# Patient Record
Sex: Male | Born: 1965 | Race: Black or African American | Hispanic: No | Marital: Single | Smoking: Never smoker
Health system: Southern US, Community
[De-identification: ages and names within clinical notes are randomized; demographics above are authoritative.]

## PROBLEM LIST (undated history)

## (undated) DIAGNOSIS — N186 End stage renal disease: Secondary | ICD-10-CM

## (undated) DIAGNOSIS — N320 Bladder-neck obstruction: Secondary | ICD-10-CM

## (undated) DIAGNOSIS — D631 Anemia in chronic kidney disease: Secondary | ICD-10-CM

## (undated) DIAGNOSIS — Z978 Presence of other specified devices: Secondary | ICD-10-CM

## (undated) DIAGNOSIS — N2581 Secondary hyperparathyroidism of renal origin: Secondary | ICD-10-CM

## (undated) DIAGNOSIS — I10 Essential (primary) hypertension: Secondary | ICD-10-CM

---

## 2018-07-23 ENCOUNTER — Ambulatory Visit (HOSPITAL_COMMUNITY)
Admission: EM | Admit: 2018-07-23 | Discharge: 2018-07-23 | Disposition: A | Discharge status: Home / Self Care | Payer: BLUE CROSS/BLUE SHIELD | Attending: Family Medicine | Admitting: Family Medicine

## 2018-07-23 ENCOUNTER — Encounter (HOSPITAL_COMMUNITY): Payer: Self-pay | Admitting: Emergency Medicine

## 2018-07-23 DIAGNOSIS — N401 Enlarged prostate with lower urinary tract symptoms: Secondary | ICD-10-CM

## 2018-07-23 DIAGNOSIS — N138 Other obstructive and reflux uropathy: Secondary | ICD-10-CM | POA: Diagnosis not present

## 2018-07-23 LAB — POCT URINALYSIS DIP (DEVICE)
BILIRUBIN URINE: NEGATIVE
Glucose, UA: NEGATIVE mg/dL
Hgb urine dipstick: NEGATIVE
KETONES UR: NEGATIVE mg/dL
Leukocytes,Ua: NEGATIVE
Nitrite: NEGATIVE
Protein, ur: NEGATIVE mg/dL
Specific Gravity, Urine: <=1.005 (ref 1.005–1.030)
Urobilinogen, UA: 0.2 mg/dL (ref 0.0–1.0)
pH: 5.5 (ref 5.0–8.0)

## 2018-07-23 MED ORDER — FINASTERIDE 5 MG PO TABS: 5.0000 mg | ORAL_TABLET | Freq: Every day | ORAL | 1 refills | Status: DC

## 2018-07-23 NOTE — ED Triage Notes (Signed)
Pt here for urinary frequency

## 2018-07-23 NOTE — Discharge Instructions (Signed)
Take finasteride once a day in the evening. This will slowly take the swelling down in the prostate.  It may be a month before you see good improvement. Continue to take this pill once a day every day You need to follow-up with a primary care doctor.  You are overdue for a checkup and blood work.  You need to follow-up on your blood pressure.

## 2018-07-23 NOTE — ED Provider Notes (Signed)
Daniel Mccarty    CSN: [id number] Arrival date & time: 07/23/18  1401     History   Chief Complaint Chief Complaint  Patient presents with  . Dysuria    appt 1400    HPI Daniel Mccarty is a 53 y.o. male.   HPI  Healthy 53 year old male who works for [employer].  Physically active.  Not under doctor's care.  Does not smoke.  Does not drink.  Eats well.  Gets plenty of exercise. He is here because over the last year he has noticed a gradual change in his ability to pass urine.  He goes more frequently.  He has to strain to get his urine started.  He has some dribbling at the end of urination.  He feels like he cannot empty his bladder and has to go to the bathroom over and over again.  He is getting up every hour at night to urinate. No flank pain or abdominal pain.  No nausea or vomiting.  No fever chills.  No pain with urination or dysuria.  No blood in the urine No family history of kidney or bladder or prostate problems Because he is not seeing a physician he has not had a physical.  He has not had a colonoscopy.  Immunizations are not up-to-date.  He has not had his blood pressure checked nor had screening cholesterol and sugar values.  I emphasized to him the importance of regular medical care for prevention and wellness  History reviewed. No pertinent past medical history.  There are no active problems to display for this patient.   History reviewed. No pertinent surgical history.     Home Medications    Prior to Admission medications   Medication Sig Start Date End Date Taking? Authorizing Provider  finasteride (PROSCAR) 5 MG tablet Take 1 tablet (5 mg total) by mouth daily. 07/23/18   Raylene Everts, MD    Family History History reviewed. No pertinent family history.  Social History Social History   Tobacco Use  . Smoking status: Never Smoker  . Smokeless tobacco: Never Used  Substance Use Topics  . Alcohol use: Never    Frequency: Never  . Drug use:  Never     Allergies   Patient has no known allergies.   Review of Systems Review of Systems  Constitutional: Negative for chills and fever.  HENT: Negative for ear pain and sore throat.   Eyes: Negative for pain and visual disturbance.  Respiratory: Negative for cough and shortness of breath.   Cardiovascular: Negative for chest pain and palpitations.  Gastrointestinal: Negative for abdominal pain and vomiting.  Genitourinary: Positive for decreased urine volume, difficulty urinating and frequency. Negative for dysuria and hematuria.  Musculoskeletal: Negative for arthralgias and back pain.  Skin: Negative for color change and rash.  Neurological: Negative for seizures and syncope.  Psychiatric/Behavioral: Positive for sleep disturbance.  All other systems reviewed and are negative.    Physical Exam Triage Vital Signs ED Triage Vitals [07/23/18 1425]  Enc Vitals Group     BP (!) 187/104     Pulse Rate 99     Resp 18     Temp 98.5 F (36.9 C)     Temp Source Oral     SpO2 100 %     Weight      Height      Head Circumference      Peak Flow      Pain Score 4  Pain Loc      Pain Edu?      Excl. in [place]?    No data found.  Updated Vital Signs BP (!) 187/104 (BP Location: Right Arm)   Pulse 99   Temp 98.5 F (36.9 C) (Oral)   Resp 18   SpO2 100%      Physical Exam Constitutional:      General: He is not in acute distress.    Appearance: Normal appearance. He is well-developed.  HENT:     Head: Normocephalic and atraumatic.     Mouth/Throat:     Mouth: Mucous membranes are moist.  Eyes:     Conjunctiva/sclera: Conjunctivae normal.     Pupils: Pupils are equal, round, and reactive to light.  Neck:     Musculoskeletal: Normal range of motion.  Cardiovascular:     Rate and Rhythm: Normal rate and regular rhythm.     Heart sounds: Normal heart sounds.  Pulmonary:     Effort: Pulmonary effort is normal. No respiratory distress.     Breath sounds: Normal  breath sounds.  Abdominal:     General: Abdomen is flat. There is no distension.     Palpations: Abdomen is soft. There is no mass.  Genitourinary:    Comments: Prostate deferred Musculoskeletal: Normal range of motion.  Skin:    General: Skin is warm and dry.  Neurological:     Mental Status: He is alert.  Psychiatric:        Mood and Affect: Mood normal.        Behavior: Behavior normal.      UC Treatments / Results  Labs (all labs ordered are listed, but only abnormal results are displayed) Labs Reviewed  POCT URINALYSIS DIP (DEVICE)    EKG None  Radiology No results found.  Procedures Procedures (including critical care time)  Medications Ordered in UC Medications - No data to display  Initial Impression / Assessment and Plan / UC Course  I have reviewed the triage vital signs and the nursing notes.  Pertinent labs & imaging results that were available during my care of the patient were reviewed by me and considered in my medical decision making (see chart for details).     *Discussed BPH.  Discussed need for additional medical care and work-up.  Recommend strongly he call for primary care appointment. Final Clinical Impressions(s) / UC Diagnoses   Final diagnoses:  BPH with obstruction/lower urinary tract symptoms     Discharge Instructions     Take finasteride once a day in the evening. This will slowly take the swelling down in the prostate.  It may be a month before you see good improvement. Continue to take this pill once a day every day You need to follow-up with a primary care doctor.  You are overdue for a checkup and blood work.  You need to follow-up on your blood pressure.   ED Prescriptions    Medication Sig Dispense Auth. Provider   finasteride (PROSCAR) 5 MG tablet Take 1 tablet (5 mg total) by mouth daily. 30 tablet Raylene Everts, MD     Controlled Substance Prescriptions [place] Controlled Substance Registry consulted? Not  Applicable   Raylene Everts, MD 07/23/18 [phone]

## 2018-08-09 ENCOUNTER — Encounter (HOSPITAL_COMMUNITY): Payer: Self-pay | Admitting: Emergency Medicine

## 2018-08-09 ENCOUNTER — Ambulatory Visit (HOSPITAL_COMMUNITY)
Admission: EM | Admit: 2018-08-09 | Discharge: 2018-08-09 | Disposition: A | Discharge status: Home / Self Care | Payer: BLUE CROSS/BLUE SHIELD | Attending: Family Medicine | Admitting: Family Medicine

## 2018-08-09 DIAGNOSIS — I10 Essential (primary) hypertension: Secondary | ICD-10-CM | POA: Insufficient documentation

## 2018-08-09 DIAGNOSIS — R351 Nocturia: Secondary | ICD-10-CM | POA: Insufficient documentation

## 2018-08-09 LAB — POCT URINALYSIS DIP (DEVICE)
Bilirubin Urine: NEGATIVE
Glucose, UA: NEGATIVE mg/dL
HGB URINE DIPSTICK: NEGATIVE
Ketones, ur: NEGATIVE mg/dL
Leukocytes,Ua: NEGATIVE
Nitrite: NEGATIVE
Protein, ur: NEGATIVE mg/dL
Urobilinogen, UA: 0.2 mg/dL (ref 0.0–1.0)
pH: 5.5 (ref 5.0–8.0)

## 2018-08-09 LAB — COMPREHENSIVE METABOLIC PANEL
ALT: 23 U/L (ref 0–44)
AST: 19 U/L (ref 15–41)
Albumin: 4.4 g/dL (ref 3.5–5.0)
Alkaline Phosphatase: 54 U/L (ref 38–126)
Anion gap: 7 (ref 5–15)
BUN: 50 mg/dL — ABNORMAL HIGH (ref 6–20)
CO2: 23 mmol/L (ref 22–32)
Calcium: 9.2 mg/dL (ref 8.9–10.3)
Chloride: 110 mmol/L (ref 98–111)
Creatinine, Ser: 4.63 mg/dL — ABNORMAL HIGH (ref 0.61–1.24)
GFR calc Af Amer: 16 mL/min — ABNORMAL LOW (ref >60)
GFR calc non Af Amer: 14 mL/min — ABNORMAL LOW (ref >60)
Glucose, Bld: 106 mg/dL — ABNORMAL HIGH (ref 70–99)
Potassium: 4.7 mmol/L (ref 3.5–5.1)
SODIUM: 140 mmol/L (ref 135–145)
Total Bilirubin: 0.6 mg/dL (ref 0.3–1.2)
Total Protein: 7.7 g/dL (ref 6.5–8.1)

## 2018-08-09 MED ORDER — AMLODIPINE BESYLATE 5 MG PO TABS: 5.0000 mg | ORAL_TABLET | Freq: Every day | ORAL | 1 refills | Status: DC

## 2018-08-09 NOTE — ED Triage Notes (Signed)
Pt states for the last week whenever he wakes up "I wake up and i'm wet". Pt also c/o nausea, and also a metallic taste in his mouth.

## 2018-08-09 NOTE — ED Provider Notes (Signed)
[place] [facility]    CSN: [id number] Arrival date & time: 08/09/18  1227     History   Chief Complaint Chief Complaint  Patient presents with  . Appointment    1230  . Nausea  . Urinary Frequency    HPI Juelz Whittenberg is a 53 y.o. male.   Patient is a 53 year old male that presents here for the second time for nocturia and BPH type symptoms.  He was seen here on 07/23/2018 and started on Proscar.  He has been taking this for the past week.  He believes his symptoms are getting worse.  He is waking up in the middle of the night "wet". He has had some associated nausea and metallic taste in the mouth. No abdominal pain, back pain, fevers. No dysuria, hematuria.  He denies any medical problems but has had elevated blood pressure on multiple occasions.  He reports that he has "white coat syndrome". No associated headaches, blurred vision or dizziness. He does not smoke.      History reviewed. No pertinent past medical history.  There are no active problems to display for this patient.   History reviewed. No pertinent surgical history.     Home Medications    Prior to Admission medications   Medication Sig Start Date End Date Taking? Authorizing Provider  amLODipine (NORVASC) 5 MG tablet Take 1 tablet (5 mg total) by mouth daily. 08/09/18   Loura Halt A, NP  finasteride (PROSCAR) 5 MG tablet Take 1 tablet (5 mg total) by mouth daily. 07/23/18   Raylene Everts, MD    Family History No family history on file.  Social History Social History   Tobacco Use  . Smoking status: Never Smoker  . Smokeless tobacco: Never Used  Substance Use Topics  . Alcohol use: Never    Frequency: Never  . Drug use: Never     Allergies   Patient has no known allergies.   Review of Systems Review of Systems  Gastrointestinal: Positive for nausea. Negative for abdominal distention, abdominal pain, constipation, diarrhea and vomiting.  Genitourinary: Positive for decreased  urine volume, difficulty urinating, enuresis, frequency and urgency. Negative for discharge, dysuria, flank pain, genital sores, hematuria, penile pain, penile swelling, scrotal swelling and testicular pain.     Physical Exam Triage Vital Signs ED Triage Vitals  Enc Vitals Group     BP 08/09/18 1301 (!) 195/113     Pulse Rate 08/09/18 1301 81     Resp 08/09/18 1301 16     Temp 08/09/18 1301 98.4 F (36.9 C)     Temp Source 08/09/18 1301 Oral     SpO2 08/09/18 1301 100 %     Weight --      Height --      Head Circumference --      Peak Flow --      Pain Score 08/09/18 1304 0     Pain Loc --      Pain Edu? --      Excl. in [place]? --    No data found.  Updated Vital Signs BP (!) 172/116 (BP Location: Right Arm)   Pulse 71   Temp 98.4 F (36.9 C) (Oral)   Resp 16   SpO2 98%   Visual Acuity Right Eye Distance:   Left Eye Distance:   Bilateral Distance:    Right Eye Near:   Left Eye Near:    Bilateral Near:     Physical Exam Vitals signs and  nursing note reviewed.  Constitutional:      General: He is not in acute distress.    Appearance: Normal appearance. He is well-developed. He is not toxic-appearing or diaphoretic.  HENT:     Head: Normocephalic and atraumatic.     Nose: Nose normal.  Eyes:     Conjunctiva/sclera: Conjunctivae normal.  Neck:     Musculoskeletal: Normal range of motion.  Pulmonary:     Effort: Pulmonary effort is normal.  Abdominal:     Palpations: Abdomen is soft.     Tenderness: There is no abdominal tenderness.  Musculoskeletal: Normal range of motion.  Skin:    General: Skin is warm and dry.     Findings: No rash.  Neurological:     Mental Status: He is alert.  Psychiatric:        Mood and Affect: Mood normal.      UC Treatments / Results  Labs (all labs ordered are listed, but only abnormal results are displayed) Labs Reviewed  COMPREHENSIVE METABOLIC PANEL  POCT URINALYSIS DIP (DEVICE)    EKG None  Radiology No  results found.  Procedures Procedures (including critical care time)  Medications Ordered in UC Medications - No data to display  Initial Impression / Assessment and Plan / UC Course  I have reviewed the triage vital signs and the nursing notes.  Pertinent labs & imaging results that were available during my care of the patient were reviewed by me and considered in my medical decision making (see chart for details).     Pt is a 53 year old male that has returned with same symptoms as previous when he was seen on 07/23/2018. He was started on Proscar for BPH symptoms and feels like its not helping. He was told at previous visit that this medication could take a month to start working. Pt reassured of this.  Urine was negative for infection The other concern is the elevated BP on multiple occasions.  He reports that he has "whitecoat syndrome" I am not sure if this is the case Denies any dizziness, headaches or blurred vision. No chest pain, palpitations or shortness of breath BP elevate on 2 occasions today with the lowest at 172/116  I feel would be appropriate to check his kidney function and start him on low-dose blood pressure medication to control his blood pressure. He does not currently have a primary care provider. Recommended that he find a primary care provider soon as possible for a complete physical and referral to urology Talked to him about BPH and the other concern of prostate cancer, and the importance of following up sooner than later Pt understanding and agreed.  Stated that he will continue to take the Proscar and start taking the low-dose amlodipine Contact for primary care and urology placed on discharge instructions    Final Clinical Impressions(s) / UC Diagnoses   Final diagnoses:  Nocturia  Essential hypertension     Discharge Instructions     Your urine was negative for infection here today I do believe that your symptoms are related to BPH which  was discussed at your prior visit You need to follow-up with urology for further evaluation of this issue I am also concerned about your elevated blood pressure I am checking a few labs today to check your kidney function I believe you could benefit from starting on a low-dose blood pressure medication daily I have put 2 contacts on your discharge instructions 1 is for primary care any  other is for a urologist    ED Prescriptions    Medication Sig Dispense Auth. Provider   amLODipine (NORVASC) 5 MG tablet Take 1 tablet (5 mg total) by mouth daily. 30 tablet Loura Halt A, NP     Controlled Substance Prescriptions [place] Controlled Substance Registry consulted? Not Applicable   Orvan July, NP 08/09/18 1423

## 2018-08-09 NOTE — Discharge Instructions (Addendum)
Your urine was negative for infection here today I do believe that your symptoms are related to BPH which was discussed at your prior visit You need to follow-up with urology for further evaluation of this issue I am also concerned about your elevated blood pressure I am checking a few labs today to check your kidney function I believe you could benefit from starting on a low-dose blood pressure medication daily I have put 2 contacts on your discharge instructions 1 is for primary care any other is for a urologist

## 2018-08-11 ENCOUNTER — Telehealth (HOSPITAL_COMMUNITY): Payer: Self-pay | Admitting: Family Medicine

## 2018-08-11 NOTE — Telephone Encounter (Signed)
Patient called with lab results to instruct him to go to the ER for acute versus chronic kidney failure and further evaluation.  Patient reporting that he has things he has to do and he will get there when he can. Reiterated to patient the seriousness of his kidney failure. Pt understands.

## 2018-08-12 ENCOUNTER — Telehealth (HOSPITAL_COMMUNITY): Payer: Self-pay | Admitting: Family Medicine

## 2018-08-12 NOTE — Telephone Encounter (Signed)
Labs reviewed.  He is in renal failure.  He was advised to go to the ER yesterday for treatment.  He has a GFR of 14.  I called and left a message for him to call me.

## 2018-08-13 ENCOUNTER — Other Ambulatory Visit: Payer: Self-pay

## 2018-08-13 ENCOUNTER — Encounter (HOSPITAL_COMMUNITY): Payer: Self-pay | Admitting: Emergency Medicine

## 2018-08-13 ENCOUNTER — Inpatient Hospital Stay (HOSPITAL_COMMUNITY)
Admission: EM | Admit: 2018-08-13 | Discharge: 2018-08-15 | DRG: 684 | Disposition: A | Discharge status: Home / Self Care | Payer: BLUE CROSS/BLUE SHIELD | Attending: Internal Medicine | Admitting: Internal Medicine

## 2018-08-13 ENCOUNTER — Emergency Department (HOSPITAL_COMMUNITY): Payer: BLUE CROSS/BLUE SHIELD

## 2018-08-13 DIAGNOSIS — K769 Liver disease, unspecified: Secondary | ICD-10-CM | POA: Diagnosis present

## 2018-08-13 DIAGNOSIS — N401 Enlarged prostate with lower urinary tract symptoms: Secondary | ICD-10-CM | POA: Diagnosis present

## 2018-08-13 DIAGNOSIS — D1803 Hemangioma of intra-abdominal structures: Secondary | ICD-10-CM | POA: Diagnosis present

## 2018-08-13 DIAGNOSIS — N1330 Unspecified hydronephrosis: Secondary | ICD-10-CM | POA: Diagnosis present

## 2018-08-13 DIAGNOSIS — N39490 Overflow incontinence: Secondary | ICD-10-CM | POA: Diagnosis present

## 2018-08-13 DIAGNOSIS — R3911 Hesitancy of micturition: Secondary | ICD-10-CM | POA: Diagnosis present

## 2018-08-13 DIAGNOSIS — N179 Acute kidney failure, unspecified: Secondary | ICD-10-CM | POA: Diagnosis not present

## 2018-08-13 DIAGNOSIS — R338 Other retention of urine: Secondary | ICD-10-CM | POA: Diagnosis present

## 2018-08-13 DIAGNOSIS — N289 Disorder of kidney and ureter, unspecified: Secondary | ICD-10-CM

## 2018-08-13 DIAGNOSIS — I10 Essential (primary) hypertension: Secondary | ICD-10-CM | POA: Diagnosis present

## 2018-08-13 DIAGNOSIS — N320 Bladder-neck obstruction: Secondary | ICD-10-CM | POA: Diagnosis present

## 2018-08-13 DIAGNOSIS — R339 Retention of urine, unspecified: Secondary | ICD-10-CM

## 2018-08-13 LAB — COMPREHENSIVE METABOLIC PANEL
ALT: 21 U/L (ref 0–44)
AST: 15 U/L (ref 15–41)
Albumin: 4.3 g/dL (ref 3.5–5.0)
Alkaline Phosphatase: 53 U/L (ref 38–126)
Anion gap: 7 (ref 5–15)
BUN: 47 mg/dL — ABNORMAL HIGH (ref 6–20)
CO2: 23 mmol/L (ref 22–32)
Calcium: 9.3 mg/dL (ref 8.9–10.3)
Chloride: 109 mmol/L (ref 98–111)
Creatinine, Ser: 4.51 mg/dL — ABNORMAL HIGH (ref 0.61–1.24)
GFR calc Af Amer: 16 mL/min — ABNORMAL LOW (ref >60)
GFR calc non Af Amer: 14 mL/min — ABNORMAL LOW (ref >60)
Glucose, Bld: 103 mg/dL — ABNORMAL HIGH (ref 70–99)
Potassium: 4.5 mmol/L (ref 3.5–5.1)
Sodium: 139 mmol/L (ref 135–145)
TOTAL PROTEIN: 7.9 g/dL (ref 6.5–8.1)
Total Bilirubin: 0.7 mg/dL (ref 0.3–1.2)

## 2018-08-13 LAB — CBC WITH DIFFERENTIAL/PLATELET
Abs Immature Granulocytes: 0.02 10*3/uL (ref 0.00–0.07)
BASOS ABS: 0.1 10*3/uL (ref 0.0–0.1)
Basophils Relative: 1 %
EOS PCT: 1 %
Eosinophils Absolute: 0.1 10*3/uL (ref 0.0–0.5)
HEMATOCRIT: 39 % (ref 39.0–52.0)
Hemoglobin: 12.3 g/dL — ABNORMAL LOW (ref 13.0–17.0)
Immature Granulocytes: 0 %
Lymphocytes Relative: 26 %
Lymphs Abs: 2 10*3/uL (ref 0.7–4.0)
MCH: 28.5 pg (ref 26.0–34.0)
MCHC: 31.5 g/dL (ref 30.0–36.0)
MCV: 90.5 fL (ref 80.0–100.0)
Monocytes Absolute: 0.6 10*3/uL (ref 0.1–1.0)
Monocytes Relative: 8 %
Neutro Abs: 4.8 10*3/uL (ref 1.7–7.7)
Neutrophils Relative %: 64 %
Platelets: 232 10*3/uL (ref 150–400)
RBC: 4.31 MIL/uL (ref 4.22–5.81)
RDW: 10.6 % — ABNORMAL LOW (ref 11.5–15.5)
WBC: 7.5 10*3/uL (ref 4.0–10.5)
nRBC: 0 % (ref 0.0–0.2)

## 2018-08-13 LAB — URINALYSIS, ROUTINE W REFLEX MICROSCOPIC
Bacteria, UA: NONE SEEN
Bilirubin Urine: NEGATIVE
Glucose, UA: NEGATIVE mg/dL
Hgb urine dipstick: NEGATIVE
Ketones, ur: NEGATIVE mg/dL
Nitrite: NEGATIVE
PH: 6 (ref 5.0–8.0)
Protein, ur: NEGATIVE mg/dL
Specific Gravity, Urine: 1.005 (ref 1.005–1.030)

## 2018-08-13 MED ORDER — ONDANSETRON HCL 4 MG/2ML IJ SOLN: 4.0000 mg | Freq: Four times a day (QID) | INTRAMUSCULAR | Status: DC | PRN

## 2018-08-13 MED ORDER — NEPRO/CARBSTEADY PO LIQD: 237.0000 mL | Freq: Three times a day (TID) | ORAL | Status: DC | PRN

## 2018-08-13 MED ORDER — CAMPHOR-MENTHOL 0.5-0.5 % EX LOTN: 1.0000 "application " | TOPICAL_LOTION | Freq: Three times a day (TID) | CUTANEOUS | Status: DC | PRN

## 2018-08-13 MED ORDER — HEPARIN SODIUM (PORCINE) 5000 UNIT/ML IJ SOLN
5000.0000 [IU] | Freq: Three times a day (TID) | INTRAMUSCULAR | Status: DC
  Administered 2018-08-14 – 2018-08-15 (×5): 5000 [IU] via SUBCUTANEOUS
  Filled 2018-08-13 (×5): qty 1

## 2018-08-13 MED ORDER — ACETAMINOPHEN 325 MG PO TABS: 650.0000 mg | ORAL_TABLET | Freq: Four times a day (QID) | ORAL | Status: DC | PRN

## 2018-08-13 MED ORDER — ACETAMINOPHEN 650 MG RE SUPP: 650.0000 mg | Freq: Four times a day (QID) | RECTAL | Status: DC | PRN

## 2018-08-13 MED ORDER — HYDROXYZINE HCL 25 MG PO TABS: 25.0000 mg | ORAL_TABLET | Freq: Three times a day (TID) | ORAL | Status: DC | PRN

## 2018-08-13 MED ORDER — DOCUSATE SODIUM 283 MG RE ENEM: 1.0000 | ENEMA | RECTAL | Status: DC | PRN

## 2018-08-13 MED ORDER — SODIUM CHLORIDE 0.9 % IV SOLN
INTRAVENOUS | Status: DC
  Administered 2018-08-14 – 2018-08-15 (×4): via INTRAVENOUS

## 2018-08-13 MED ORDER — AMLODIPINE BESYLATE 5 MG PO TABS
5.0000 mg | ORAL_TABLET | Freq: Once | ORAL | Status: AC
  Administered 2018-08-13: 5 mg via ORAL
  Filled 2018-08-13: qty 1

## 2018-08-13 MED ORDER — SORBITOL 70 % SOLN: 30.0000 mL | Status: DC | PRN

## 2018-08-13 MED ORDER — CALCIUM CARBONATE ANTACID 1250 MG/5ML PO SUSP: 500.0000 mg | Freq: Four times a day (QID) | ORAL | Status: DC | PRN

## 2018-08-13 MED ORDER — ZOLPIDEM TARTRATE 5 MG PO TABS
5.0000 mg | ORAL_TABLET | Freq: Every evening | ORAL | Status: DC | PRN
  Filled 2018-08-13: qty 1

## 2018-08-13 MED ORDER — SODIUM CHLORIDE 0.9 % IV SOLN
Freq: Once | INTRAVENOUS | Status: AC
  Administered 2018-08-14: via INTRAVENOUS

## 2018-08-13 MED ORDER — ONDANSETRON HCL 4 MG PO TABS: 4.0000 mg | ORAL_TABLET | Freq: Four times a day (QID) | ORAL | Status: DC | PRN

## 2018-08-13 NOTE — ED Notes (Signed)
Patient transported to Ultrasound 

## 2018-08-13 NOTE — ED Notes (Signed)
ED TO INPATIENT HANDOFF REPORT  ED Nurse Name and Phone #:  Lenice Pressman [phone]  S Name/Age/Gender Daniel Mccarty 53 y.o. male Room/Bed: 039C/039C  Code Status   Code Status: Full Code  Home/SNF/Other Home Patient oriented to: self, place, time and situation Is this baseline? Yes   Triage Complete: Triage complete  Chief Complaint uncontrollable urtinating/hbp/metallic taste in mouth  Triage Note Pt c/o problems urinating and st's when he takes a nap or goes to sleep he wakes up wet.  Pt also c/o nausea and a metallic taste in his mouth.  Pt was recently seen for same at Urgent Care.   Allergies No Known Allergies  Level of Care/Admitting Diagnosis ED Disposition    ED Disposition Condition Comment   Admit  Hospital Area: [facility] [[id number]]  Level of Care: Med-Surg [16]  Diagnosis: Acute renal insufficiency [381017]  Admitting Physician: Karie Kirks [phone]  Attending Physician: Benny Lennert, AVA [[id number]]  Estimated length of stay: 3 - 4 days  Certification:: I certify this patient will need inpatient services for at least 2 midnights  PT Class (Do Not Modify): Inpatient [101]  PT Acc Code (Do Not Modify): Private [1]       B Medical/Surgery History History reviewed. No pertinent past medical history. History reviewed. No pertinent surgical history.   A IV Location/Drains/Wounds Patient Lines/Drains/Airways Status   Active Line/Drains/Airways    Name:   Placement date:   Placement time:   Site:   Days:   Peripheral IV 08/13/18 Right Antecubital   08/13/18    2248    Antecubital   less than 1   Urethral Catheter Verlene Mayer RN Non-latex 14 Fr.   08/13/18    2032    Non-latex   less than 1          Intake/Output Last 24 hours No intake or output data in the 24 hours ending 08/13/18 2249  Labs/Imaging Results for orders placed or performed during the hospital encounter of 08/13/18 (from the past 48 hour(s))  Urinalysis, Routine w reflex  microscopic     Status: Abnormal   Collection Time: 08/13/18  5:19 PM  Result Value Ref Range   Color, Urine STRAW (A) YELLOW   APPearance CLEAR CLEAR   Specific Gravity, Urine 1.005 1.005 - 1.030   pH 6.0 5.0 - 8.0   Glucose, UA NEGATIVE NEGATIVE mg/dL   Hgb urine dipstick NEGATIVE NEGATIVE   Bilirubin Urine NEGATIVE NEGATIVE   Ketones, ur NEGATIVE NEGATIVE mg/dL   Protein, ur NEGATIVE NEGATIVE mg/dL   Nitrite NEGATIVE NEGATIVE   Leukocytes,Ua TRACE (A) NEGATIVE   RBC / HPF 0-5 0 - 5 RBC/hpf   WBC, UA 6-10 0 - 5 WBC/hpf   Bacteria, UA NONE SEEN NONE SEEN   Sperm, UA PRESENT     Comment: Performed at [facility], [address] [place], [place] [address]  CBC with Differential     Status: Abnormal   Collection Time: 08/13/18  8:43 PM  Result Value Ref Range   WBC 7.5 4.0 - 10.5 K/uL   RBC 4.31 4.22 - 5.81 MIL/uL   Hemoglobin 12.3 (L) 13.0 - 17.0 g/dL   HCT 39.0 39.0 - 52.0 %   MCV 90.5 80.0 - 100.0 fL   MCH 28.5 26.0 - 34.0 pg   MCHC 31.5 30.0 - 36.0 g/dL   RDW 10.6 (L) 11.5 - 15.5 %   Platelets 232 150 - 400 K/uL   nRBC 0.0 0.0 -  0.2 %   Neutrophils Relative % 64 %   Neutro Abs 4.8 1.7 - 7.7 K/uL   Lymphocytes Relative 26 %   Lymphs Abs 2.0 0.7 - 4.0 K/uL   Monocytes Relative 8 %   Monocytes Absolute 0.6 0.1 - 1.0 K/uL   Eosinophils Relative 1 %   Eosinophils Absolute 0.1 0.0 - 0.5 K/uL   Basophils Relative 1 %   Basophils Absolute 0.1 0.0 - 0.1 K/uL   Immature Granulocytes 0 %   Abs Immature Granulocytes 0.02 0.00 - 0.07 K/uL    Comment: Performed at [facility] [address], [place], [place] [address]  Comprehensive metabolic panel     Status: Abnormal   Collection Time: 08/13/18  8:43 PM  Result Value Ref Range   Sodium 139 135 - 145 mmol/L   Potassium 4.5 3.5 - 5.1 mmol/L   Chloride 109 98 - 111 mmol/L   CO2 23 22 - 32 mmol/L   Glucose, Bld 103 (H) 70 - 99 mg/dL   BUN 47 (H) 6 - 20 mg/dL   Creatinine, Ser 4.51 (H) 0.61 - 1.24 mg/dL    Calcium 9.3 8.9 - 10.3 mg/dL   Total Protein 7.9 6.5 - 8.1 g/dL   Albumin 4.3 3.5 - 5.0 g/dL   AST 15 15 - 41 U/L   ALT 21 0 - 44 U/L   Alkaline Phosphatase 53 38 - 126 U/L   Total Bilirubin 0.7 0.3 - 1.2 mg/dL   GFR calc non Af Amer 14 (L) >60 mL/min   GFR calc Af Amer 16 (L) >60 mL/min   Anion gap 7 5 - 15    Comment: Performed at [facility] [address], [place], [place] [address]   US Renal  Result Date: 08/13/2018 CLINICAL DATA:  Elevated creatinine EXAM: RENAL / URINARY TRACT ULTRASOUND COMPLETE COMPARISON:  None. FINDINGS: Right Kidney: Renal measurements: 11.3 x 4.9 x 5.4 cm = volume: 156.5 mL . Echogenicity and renal cortical thickness are within normal limits. No mass or perinephric fluid visualized. There is slight hydronephrosis. There is no sonographically demonstrable calculus or ureterectasis. Left Kidney: Renal measurements: 10.9 x 6.1 x 5.8 cm = volume: 201.6 mL. Echogenicity and renal cortical thickness are within normal limits. No mass or perinephric fluid visualized. There is equivocal hydronephrosis on the left. No sonographically demonstrable calculus or ureterectasis. Bladder: Urinary bladder is decompressed with a Foley catheter. There does appear to be thickening of the wall of the urinary bladder. There is an echogenic lesion in the right lobe of the liver anteriorly measuring 3.5 x 3.5 x 3.3 cm. A second echogenic focus is noted in the right lobe of the liver which has a central area of decreased attenuation measuring 2.4 x 2.2 x 2.1 cm. IMPRESSION: 1. Slight hydronephrosis on the right. Borderline hydronephrosis on the left. No obstructing foci evident. Kidneys otherwise appear unremarkable. 2. Urinary bladder decompressed with Foley catheter. Question cystitis given apparent urinary bladder wall thickening. 3. Echogenic lesions in the liver. The larger lesion has an appearance suggesting hemangioma. The smaller lesion in the right lobe of the liver does not  represent a classic hemangioma by ultrasound. Given this circumstance, nonemergent pre and serial post-contrast CT or MR of the liver is advised to further evaluate. Electronically Signed   By: Lowella Grip III M.D.   On: 08/13/2018 21:22    Pending Labs [employer] (From admission, onward)    Start     Ordered  08/13/18 2226  HIV antibody (Routine Testing)  Once,   R     08/13/18 2227   08/13/18 1934  Urine culture  ONCE - STAT,   STAT     08/13/18 1933          Vitals/Pain Today's Vitals   08/13/18 2139 08/13/18 2145 08/13/18 2215 08/13/18 2230  BP:  (!) 170/99 (!) 157/106 (!) 160/102  Pulse:  63 62 63  Resp:      Temp:      TempSrc:      SpO2:  96% 97% 97%  Weight:      Height:      PainSc: 1        Isolation Precautions No active isolations  Medications Medications  0.9 %  sodium chloride infusion (has no administration in time range)  acetaminophen (TYLENOL) tablet 650 mg (has no administration in time range)    Or  acetaminophen (TYLENOL) suppository 650 mg (has no administration in time range)  zolpidem (AMBIEN) tablet 5 mg (has no administration in time range)  camphor-menthol (SARNA) lotion 1 application (has no administration in time range)    And  hydrOXYzine (ATARAX/VISTARIL) tablet 25 mg (has no administration in time range)  sorbitol 70 % solution 30 mL (has no administration in time range)  docusate sodium (ENEMEEZ) enema 283 mg (has no administration in time range)  ondansetron (ZOFRAN) tablet 4 mg (has no administration in time range)    Or  ondansetron (ZOFRAN) injection 4 mg (has no administration in time range)  calcium carbonate (dosed in mg elemental calcium) suspension 500 mg of elemental calcium (has no administration in time range)  feeding supplement (NEPRO CARB STEADY) liquid 237 mL (has no administration in time range)  heparin injection 5,000 Units (has no administration in time range)  0.9 %  sodium chloride infusion (has no  administration in time range)  amLODipine (NORVASC) tablet 5 mg (5 mg Oral Given 08/13/18 2136)    Mobility walks Low fall risk   Focused Assessments    R Recommendations: See Admitting Provider Note  Report given to:   Additional Notes:

## 2018-08-13 NOTE — ED Triage Notes (Signed)
Pt c/o problems urinating and st's when he takes a nap or goes to sleep he wakes up wet.  Pt also c/o nausea and a metallic taste in his mouth.  Pt was recently seen for same at Urgent Care.

## 2018-08-13 NOTE — ED Provider Notes (Addendum)
[facility] EMERGENCY DEPARTMENT Provider Note   CSN: [id number] Arrival date & time: 08/13/18  1536    History   Chief Complaint Chief Complaint  Patient presents with  . Urinary Frequency    HPI Daniel Mccarty is a 53 y.o. male.     HPI   Pt is a 52 y/o male who presents to the ED c/o urinary incontinence  that has been going on for about 1 week. States he wakes up "wet". He does not have any urinary incontinence during the day.  He states he has had urinary hesitancy and difficulty emptying his bladder for several years.  He relates this to his prostate.  He reports intermittent dysuria.  He denies frequency or hematuria.  He has been seen twice at urgent care for similar sxs and symptoms were felt to be secondary to BPH.  He was started on Proscar on 07/23/2018.  His symptoms have not improved and he has now developed urinary incontinence.  He was informed that it could take about a month for sxs to improve on this medication. He was advised to f/u with urology. He has not done so as he did not realize he was supposed to.  He was also started on amlodipine about 4 days ago.  He did not take this medication today.   He also endorses nausea, but denies vomiting, diarrhea, or constipation. No back pain. No LE weakness/numbness. No loss of control of bowel function. Normal ambulation. No fevers.  He denies any chest pain, shortness of breath, headache, dizziness or lightheadedness.  No vision changes.  He also reports that he feels like he tastes metal in his mouth for the last week.  On review of records it appears the patient had labs drawn on 08/09/2018 urgent care which showed an elevated creatinine above 4 which is new for him.  He has no known diagnosis of CKD.  He was contacted on 08/11/2018 advised to come to the ED.  History reviewed. No pertinent past medical history.  There are no active problems to display for this patient.   History reviewed. No pertinent  surgical history.    Home Medications    Prior to Admission medications   Medication Sig Start Date End Date Taking? Authorizing Provider  amLODipine (NORVASC) 5 MG tablet Take 1 tablet (5 mg total) by mouth daily. 08/09/18   Loura Halt A, NP  finasteride (PROSCAR) 5 MG tablet Take 1 tablet (5 mg total) by mouth daily. 07/23/18   Raylene Everts, MD    Family History No family history on file.  Social History Social History   Tobacco Use  . Smoking status: Never Smoker  . Smokeless tobacco: Never Used  Substance Use Topics  . Alcohol use: Never    Frequency: Never  . Drug use: Never     Allergies   Patient has no known allergies.   Review of Systems Review of Systems  Constitutional: Negative for fever.  HENT: Negative for ear pain and sore throat.   Eyes: Negative for visual disturbance.  Respiratory: Negative for cough and shortness of breath.   Cardiovascular: Negative for chest pain and leg swelling.  Gastrointestinal: Negative for abdominal pain and vomiting.  Genitourinary: Positive for difficulty urinating, dysuria and urgency. Negative for discharge, hematuria and penile pain.       Nocturia  Musculoskeletal: Negative for back pain.  Skin: Negative for color change and rash.  Neurological: Negative for headaches.  All other systems reviewed  and are negative.  Physical Exam Updated Vital Signs BP (!) 179/106 (BP Location: Right Arm)   Pulse 78   Temp 98.1 F (36.7 C) (Oral)   Resp 16   Ht 6\' 1"  (1.854 m)   Wt 99.8 kg   SpO2 98%   BMI 29.03 kg/m   Physical Exam  Constitutional: He is oriented to person, place, and time and well-developed, well-nourished, and in no distress. No distress.  HENT:  Head: Normocephalic and atraumatic.  Eyes: Conjunctivae are normal.  Neck: Neck supple.  Cardiovascular: Normal rate, regular rhythm, normal heart sounds and intact distal pulses.  Pulmonary/Chest: Effort normal and breath sounds normal. He has no  wheezes. He has no rales.  Abdominal: Soft. Bowel sounds are normal. He exhibits no distension. There is no abdominal tenderness. There is no rebound and no guarding.  Musculoskeletal: Normal range of motion.  Neurological: He is alert and oriented to person, place, and time.  Skin: Skin is warm and dry. He is not diaphoretic.  Psychiatric: Affect normal.     ED Treatments / Results  Labs (all labs ordered are listed, but only abnormal results are displayed) Labs Reviewed  URINALYSIS, ROUTINE W REFLEX MICROSCOPIC - Abnormal; Notable for the following components:      Result Value   Color, Urine STRAW (*)    Leukocytes,Ua TRACE (*)    All other components within normal limits  URINE CULTURE  CBC WITH DIFFERENTIAL/PLATELET  COMPREHENSIVE METABOLIC PANEL    EKG None  Radiology No results found.  Procedures Procedures (including critical care time)  Medications Ordered in ED Medications  amLODipine (NORVASC) tablet 5 mg (has no administration in time range)     Initial Impression / Assessment and Plan / ED Course  I have reviewed the triage vital signs and the nursing notes.  Pertinent labs & imaging results that were available during my care of the patient were reviewed by me and considered in my medical decision making (see chart for details).     Final Clinical Impressions(s) / ED Diagnoses   Final diagnoses:  Urinary retention   Patient presenting with complaints of urinary hesitancy, difficulty emptying his bladder and nocturia.  He has had hesitancy and difficulty emptying his bladder for several years however the nocturia just started 1 week ago.  He also reports a metallic taste in his mouth.  He has been seen in urgent care several times for his symptoms.  He was started on Proscar about 3 weeks ago.  He was also started on amlodipine after being found to have elevated blood pressure at urgent care.  CMP was drawn at urgent care several days ago which showed  elevated kidney function with creatinine above 4.  At that time he was advised to come to the ED for evaluation.  He presents today with continued symptoms.  Urinalysis shows trace leukocytes, 0-5 RBCs, 6-10 WBCs and no bacteria.  A culture will be sent given his symptoms.  Post void mild residual bladder scan was obtained which showed greater than 1 L of urine in the bladder.  A Foley catheter was placed to empty the bladder.  CBC, CMP will be obtained.  Renal ultrasound will be obtained.  Based on results, disposition decision will be made however anticipate admission for renal failure.  Care signed out to Dr. Ralene Bathe at shift change with above plan.  ED Discharge Orders    None       Rodney Booze, [place] 08/13/18 2110  Quintella Reichert, MD 08/13/18 2321    Tivis Ringer Draeden Kellman S, PA-C 08/29/18 1319    Quintella Reichert, MD 08/30/18 [phone]

## 2018-08-13 NOTE — ED Notes (Signed)
  Bladder scan performed showing greater than 949ml.  Dr. Ralene Bathe and Coral Ceo PA notified.

## 2018-08-14 ENCOUNTER — Other Ambulatory Visit: Payer: Self-pay

## 2018-08-14 ENCOUNTER — Inpatient Hospital Stay (HOSPITAL_COMMUNITY): Payer: BLUE CROSS/BLUE SHIELD

## 2018-08-14 DIAGNOSIS — N289 Disorder of kidney and ureter, unspecified: Secondary | ICD-10-CM

## 2018-08-14 DIAGNOSIS — N320 Bladder-neck obstruction: Secondary | ICD-10-CM

## 2018-08-14 DIAGNOSIS — K769 Liver disease, unspecified: Secondary | ICD-10-CM | POA: Diagnosis present

## 2018-08-14 DIAGNOSIS — I10 Essential (primary) hypertension: Secondary | ICD-10-CM

## 2018-08-14 LAB — MRSA PCR SCREENING: MRSA by PCR: NEGATIVE

## 2018-08-14 LAB — HIV ANTIBODY (ROUTINE TESTING W REFLEX): HIV Screen 4th Generation wRfx: NONREACTIVE

## 2018-08-14 MED ORDER — HYDRALAZINE HCL 20 MG/ML IJ SOLN
10.0000 mg | Freq: Four times a day (QID) | INTRAMUSCULAR | Status: DC | PRN
  Administered 2018-08-14: 10 mg via INTRAVENOUS
  Filled 2018-08-14: qty 1

## 2018-08-14 MED ORDER — FINASTERIDE 5 MG PO TABS
5.0000 mg | ORAL_TABLET | Freq: Every day | ORAL | Status: DC
  Administered 2018-08-14 – 2018-08-15 (×2): 5 mg via ORAL
  Filled 2018-08-14 (×2): qty 1

## 2018-08-14 MED ORDER — SILODOSIN 4 MG PO CAPS
4.0000 mg | ORAL_CAPSULE | Freq: Every day | ORAL | Status: DC
  Administered 2018-08-14 – 2018-08-15 (×2): 4 mg via ORAL
  Filled 2018-08-14 (×2): qty 1

## 2018-08-14 MED ORDER — AMLODIPINE BESYLATE 5 MG PO TABS
5.0000 mg | ORAL_TABLET | Freq: Every day | ORAL | Status: DC
  Administered 2018-08-14 – 2018-08-15 (×2): 5 mg via ORAL
  Filled 2018-08-14 (×2): qty 1

## 2018-08-14 MED ORDER — NON FORMULARY: 1.0000 | Freq: Every day | Status: DC

## 2018-08-14 NOTE — Plan of Care (Signed)
  Problem: Urinary Elimination: Goal: Signs and symptoms of infection will decrease Outcome: Progressing   

## 2018-08-14 NOTE — Progress Notes (Signed)
   08/13/18 2355  Vitals  Temp 98.1 F (36.7 C)  Temp Source Oral  BP (!) 161/89  MAP (mmHg) 110  BP Location Left Arm  BP Method Automatic  Patient Position (if appropriate) Lying  Pulse Rate (!) 59  Pulse Rate Source Monitor  Resp 16  Oxygen Therapy  SpO2 98 %  O2 Device Room Air  MEWS Score  MEWS RR 0  MEWS Pulse 0  MEWS Systolic 0  MEWS LOC 0  MEWS Temp 0  MEWS Score 0  MEWS Score Color Green   Daniel Mccarty was received to 5W rm 36. Pt was oriented to unit and admission packet given. Fall education completed with pt verbalizing understanding. Skin assessment completed with no concerns noted. To continue to assess and treat  pt per MD and Nursing orders

## 2018-08-14 NOTE — Progress Notes (Signed)
Patient ID: Daniel Mccarty, male   DOB: [birth year], 53 y.o.   MRN: [id number] Patient was admitted early this morning for difficulty voiding.  He was found to be in acute renal failure with creatinine of 4.51.  Foley catheter was placed.  Patient seen and examined at bedside.  Plan of care discussed with him.  I have reviewed the patient's medical records including this morning's H&P myself.  I have reviewed current vitals, labs and medications.  Repeat a.m. labs.  Continue IV fluids.

## 2018-08-14 NOTE — H&P (Signed)
History and Physical  [name] OBS:[id number] DOB: 09-05-1965 DOA: 08/13/2018  PCP: Patient, No Pcp Per   Chief Complaint: difficulty voiding his bladder  HPI:  The patient is a 53 yr old man who states that he has had worsening problems with urinary retention for the last year. He states that it has become particularly hard in the past few days. He presented to the ED when called by the urgent care he visited and told to come in because his creatinine was elevated. After he arrived he had a foley inserted. He has put out about 2 liters of urine since then. He was found to be in acute renal failure with a creatine of 4.51. he is not aware of having a problem with his kidneys in the past.   The patient had been placed on Proscar on 07/23/2018 after going to urgent care for his symptoms which include overflow incontinence. He was advised to follow up with urology which he has not done. He has felt nauseated, but has not had emesis. He has had noticed a metallic taste in his mouth for a week.  The patient denies any past medical history.  ED Course: In the ED the patient was found to have a temperature of 98.5. His respiratory rate was 18, His heart rate was 84. His blood pressure was 194/114. He was saturating 98% on room air. Foley catheter was placed as stated above and the patient has had large volume output. So far it has been greater than 2 liters.   Sodium was 139, Potassium was 4.5, Chloride was 109. CO2 was 23. Creatinine was 4.51. BUN was 47. LFT's were unremarkable.   WBC was 7.5. Hemoglobin was 12.3. Hematocrit was 39.0. Platelets were 232.  Renal ultrasound demonstrated slight hydronephrosis on the right and borderline hydronephrosis on the left. Kidneys are otherwiise unremarkable. Some bladder wall thickening was noted in the bladder which was decompressed by foley catheter. Echogenic lesions (2) were seen in the liver. The larger of the two appeared to be a hematoma. The smaller  did not appear to be a hemagioma. Recommendation is to follow this with nonemergent pre and serial post contract CT or MR of the liver.  Review of Systems:   Negative for fever, visual changes, sore throat, rash, new muscle aches, chest pain, SOB, dysuria, bleeding, v/abdominal pain. Positive elements are recorded above in the HPI.  History reviewed. No pertinent past medical history.  History reviewed. No pertinent surgical history.   reports that he has never smoked. He has never used smokeless tobacco. He reports that he does not drink alcohol or use drugs. Mobility: Ambulatory  No Known Allergies  No family history on file.   Prior to Admission medications   Medication Sig Start Date End Date Taking? Authorizing Provider  amLODipine (NORVASC) 5 MG tablet Take 1 tablet (5 mg total) by mouth daily. 08/09/18  Yes Bast, Traci A, NP  finasteride (PROSCAR) 5 MG tablet Take 1 tablet (5 mg total) by mouth daily. 07/23/18  Yes Raylene Everts, MD    Physical Exam: Vitals:   08/13/18 2230 08/13/18 2355  BP: (!) 160/102 (!) 161/89  Pulse: 63 (!) 59  Resp:  16  Temp:  98.1 F (36.7 C)  SpO2: 97% 98%    Constitutional:    The patient is awake, alert, and oriented x 3. No acute distress. Eyes:   PERRLA, EOMBI  No scleral icterus or injection. ENMT:   grossly normal hearing  Lips appear normal  external ears, nose appear normal  Oropharynx: mucosa, tongue,posterior pharynx appear normal Neck:   neck appears normal, no masses, normal ROM, supple  no thyromegaly Respiratory:   CTA bilaterally, no wheezes, rales, or rhonchi.  No tactile fremitus   Respiratory effort normal. No retractions or accessory muscle use Cardiovascular:   RRR, no murmurs, ectopy, or gallups  No LE extremity edema    Normal pedal pulses Abdomen:   Abdomen is soft, non-tender, non-distended.   Normoactive bowel sounds.  No organomegaly, masses, or hernias. Musculoskeletal:   No  cyanosis, clubbing or edema Skin:   No rashes, lesions, ulcers  palpation of skin: no induration or nodules Neurologic:   CN 2-12 intact  Sensation all 4 extremities intact Psychiatric:   Mental status o Mood, affect appropriate o Orientation to person, place, time   judgment and insight appear intact    I have personally reviewed following labs and imaging studies  Labs:   CBC, BMP  Imaging studies:   Renal ultrasound  Test discussed with performing physician:  Ultrasound  Review and summation of old records:   The patient was seen on 07/23/2018 by [facility]. The patient has been prescribed norvasc for his hypertension and proscar for his BPH. He did not follow up with Urology as recommended.  Active Problems:   Acute renal insufficiency  Problem  Bladder Outlet Obstruction  Essential Hypertension  Hepatic Lesion     Assessment/Plan Urinary outlet obstruction: Relieved with foley catheter. Will continue proscar as at home. The patient has put out 2 liters so far. His urinary output must be carefully monitored as he is likely to pass into the diuretic phase of his urinary outlet obstruction and he may need to have in IV fluids increased to keep up. His creatine, volume status, and electrolytes must also be followed.  Urology or nephrology consultes may be considered.  Acute renal insufficiency: Secondary to urinary outlet obstruction. The patient has a foley catheter to relieve obstruction and is receiving IV fluids. As stated above his output must be carefully monitored and replaced as he transitions into the diuretic phase.  Hypertension: He will be continued on his norvasc. It is unclear whether or not he actually took it at home as advised.  Hepatic lesions: Should be followed with nonemergent pre and serial post contract CT or MR of the liver.  Severity of Illness: The appropriate patient status for this patient is INPATIENT. Inpatient status is judged  to be reasonable and necessary in order to provide the required intensity of service to ensure the patient's safety. The patient's presenting symptoms, physical exam findings, and initial radiographic and laboratory data in the context of their chronic comorbidities is felt to place them at high risk for further clinical deterioration. Furthermore, it is not anticipated that the patient will be medically stable for discharge from the hospital within 2 midnights of admission. The following factors support the patient status of inpatient.   " The patient's presenting symptoms include urinary retention. " The worrisome physical exam findings include Distended bladder initially. " The initial radiographic and laboratory data are worrisome because of bladder outllet obstruction. " The chronic co-morbidities include hypertension/   * I certify that at the point of admission it is my clinical judgment that the patient will require inpatient hospital care spanning beyond 2 midnights from the point of admission due to high intensity of service, high risk for further deterioration and high frequency of surveillance required.*  DVT prophylaxis:Heparin Code Status: Full Code Family Communication: No family available Consults called: None   Time spent: 53 minutes  Javel Hersh, DO [facility] Direct contact: see [email or web address]  7PM-7AM contact night coverage as above  08/14/2018, 12:39 AM

## 2018-08-14 NOTE — Consult Note (Signed)
Reason for Consult: Urinary Retention, Prostate Screening, Stage 4 Acute Renal Failure  Referring Physician: Aline August MD  Daniel Mccarty is an 53 y.o. male.   HPI:   1 - Urinary Retention - worsening LUTS x months 2020. Started on finasteride 07/2018. Developed frank retention with 1.5L in bladder 08/2018 and foley placed at [facility] 3/13. DRE 08/2018 >80gm smooth.   2 - Prostate Screening - No FHX prostate cancer 08/2018 DRE 80gm+ smooth.   3 - Stage 4 Acute Renal Failure - Cr 4's on ER labs 08/2018. K normal. In [place] retention. [place] with mild Rt>Lt hydro. Unclear Cr baseline. Foley and MIVF started with vigousou UOP.   PMH unremarkable. No PCP or regular health screenings.  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   Today " Daniel Mccarty " is seen in consultation for above.   History reviewed. No pertinent past medical history.  History reviewed. No pertinent surgical history.  No family history on file.  Social History:  reports that he has never smoked. He has never used smokeless tobacco. He reports that he does not drink alcohol or use drugs.  Allergies: No Known Allergies  Medications: I have reviewed the patient's current medications.  Results for orders placed or performed during the hospital encounter of 08/13/18 (from the past 48 hour(s))  Urinalysis, Routine w reflex microscopic     Status: Abnormal   Collection Time: 08/13/18  5:19 PM  Result Value Ref Range   Color, Urine STRAW (A) YELLOW   APPearance CLEAR CLEAR   Specific Gravity, Urine 1.005 1.005 - 1.030   pH 6.0 5.0 - 8.0   Glucose, UA NEGATIVE NEGATIVE mg/dL   Hgb urine dipstick NEGATIVE NEGATIVE   Bilirubin Urine NEGATIVE NEGATIVE   Ketones, ur NEGATIVE NEGATIVE mg/dL   Protein, ur NEGATIVE NEGATIVE mg/dL   Nitrite NEGATIVE NEGATIVE   Leukocytes,Ua TRACE (A) NEGATIVE   RBC / HPF 0-5 0 - 5 RBC/hpf   WBC, UA 6-10 0 - 5 WBC/hpf   Bacteria, UA NONE  SEEN NONE SEEN   Sperm, UA PRESENT     Comment: Performed at [facility], [address], [place], [place] [address]  CBC with Differential     Status: Abnormal   Collection Time: 08/13/18  8:43 PM  Result Value Ref Range   WBC 7.5 4.0 - 10.5 K/uL   RBC 4.31 4.22 - 5.81 MIL/uL   Hemoglobin 12.3 (L) 13.0 - 17.0 g/dL   HCT 39.0 39.0 - 52.0 %   MCV 90.5 80.0 - 100.0 fL   MCH 28.5 26.0 - 34.0 pg   MCHC 31.5 30.0 - 36.0 g/dL   RDW 10.6 (L) 11.5 - 15.5 %   Platelets 232 150 - 400 K/uL   nRBC 0.0 0.0 - 0.2 %   Neutrophils Relative % 64 %   Neutro Abs 4.8 1.7 - 7.7 K/uL   Lymphocytes Relative 26 %   Lymphs Abs 2.0 0.7 - 4.0 K/uL   Monocytes Relative 8 %   Monocytes Absolute 0.6 0.1 - 1.0 K/uL   Eosinophils Relative 1 %   Eosinophils Absolute 0.1 0.0 - 0.5 K/uL   Basophils Relative 1 %   Basophils Absolute 0.1 0.0 - 0.1 K/uL   Immature Granulocytes 0 %   Abs Immature Granulocytes 0.02 0.00 - 0.07 K/uL    Comment: Performed at [facility] [address], [place], [place] [address]  Comprehensive metabolic panel     Status: Abnormal   Collection Time: 08/13/18  8:43 PM  Result Value Ref Range   Sodium 139 135 - 145 mmol/L   Potassium 4.5 3.5 - 5.1 mmol/L   Chloride 109 98 - 111 mmol/L   CO2 23 22 - 32 mmol/L   Glucose, Bld 103 (H) 70 - 99 mg/dL   BUN 47 (H) 6 - 20 mg/dL   Creatinine, Ser 4.51 (H) 0.61 - 1.24 mg/dL   Calcium 9.3 8.9 - 10.3 mg/dL   Total Protein 7.9 6.5 - 8.1 g/dL   Albumin 4.3 3.5 - 5.0 g/dL   AST 15 15 - 41 U/L   ALT 21 0 - 44 U/L   Alkaline Phosphatase 53 38 - 126 U/L   Total Bilirubin 0.7 0.3 - 1.2 mg/dL   GFR calc non Af Amer 14 (L) >60 mL/min   GFR calc Af Amer 16 (L) >60 mL/min   Anion gap 7 5 - 15    Comment: Performed at [facility], [address] [place], [place] [address]  MRSA PCR Screening     Status: None   Collection Time: 08/14/18  1:32 AM  Result Value Ref Range   MRSA by PCR NEGATIVE NEGATIVE    Comment:        The  GeneXpert MRSA Assay (FDA approved for NASAL specimens only), is one component of a comprehensive MRSA colonization surveillance program. It is not intended to diagnose MRSA infection nor to guide or monitor treatment for MRSA infections. Performed at [facility], [place] [address], [place], [place] [address]     US Renal  Result Date: 08/13/2018 CLINICAL DATA:  Elevated creatinine EXAM: RENAL / URINARY TRACT ULTRASOUND COMPLETE COMPARISON:  None. FINDINGS: Right Kidney: Renal measurements: 11.3 x 4.9 x 5.4 cm = volume: 156.5 mL . Echogenicity and renal cortical thickness are within normal limits. No mass or perinephric fluid visualized. There is slight hydronephrosis. There is no sonographically demonstrable calculus or ureterectasis. Left Kidney: Renal measurements: 10.9 x 6.1 x 5.8 cm = volume: 201.6 mL. Echogenicity and renal cortical thickness are within normal limits. No mass or perinephric fluid visualized. There is equivocal hydronephrosis on the left. No sonographically demonstrable calculus or ureterectasis. Bladder: Urinary bladder is decompressed with a Foley catheter. There does appear to be thickening of the wall of the urinary bladder. There is an echogenic lesion in the right lobe of the liver anteriorly measuring 3.5 x 3.5 x 3.3 cm. A second echogenic focus is noted in the right lobe of the liver which has a central area of decreased attenuation measuring 2.4 x 2.2 x 2.1 cm. IMPRESSION: 1. Slight hydronephrosis on the right. Borderline hydronephrosis on the left. No obstructing foci evident. Kidneys otherwise appear unremarkable. 2. Urinary bladder decompressed with Foley catheter. Question cystitis given apparent urinary bladder wall thickening. 3. Echogenic lesions in the liver. The larger lesion has an appearance suggesting hemangioma. The smaller lesion in the right lobe of the liver does not represent a classic hemangioma by ultrasound. Given this circumstance, nonemergent  pre and serial post-contrast CT or MR of the liver is advised to further evaluate. Electronically Signed   By: Lowella Grip III M.D.   On: 08/13/2018 21:22    Review of Systems  Constitutional: Positive for malaise/fatigue.  HENT: Negative.   Eyes: Negative.   Respiratory: Negative.   Cardiovascular: Negative.   Gastrointestinal: Negative.   Genitourinary: Positive for frequency and urgency.  Musculoskeletal: Negative.   Skin: Negative.   Neurological: Negative.   Endo/Heme/Allergies: Negative.   Psychiatric/Behavioral: Negative.    Blood pressure (!) 180/96, pulse 80, temperature 98.7 F (37.1 C), temperature source Oral, resp. rate 16, height 6\' 1"  (1.854 m), weight 98.4 kg, SpO2 100 %. Physical Exam  Constitutional: He appears well-developed.  Appears younger than stated age. Very pleasant. Athletic build.   Eyes: Pupils are equal, round, and reactive to light.  Neck: Normal range of motion.  Cardiovascular: Normal rate.  Respiratory: Effort normal.  GI: Soft.  Genitourinary:    Genitourinary Comments: Prostate >80gm smooth by DRE.    Musculoskeletal: Normal range of motion.  Neurological: He is alert.  Skin: Skin is warm.  Psychiatric: He has a normal mood and affect.    Assessment/Plan:  1 - Urinary Retention - likely from large prostate by exam and history. Continue current foley now and at DC. Continue finasteride + rapaflo at DC as well. Explained he will need outpatient trial of void in few weeks, and should he fail x several, then urodynamics / cysto prior to outlet procedure.   2 - Prostate Screening - DRE normal. Consider PSA when catheter free as would be likley artifactually high at present.   3 - Stage 4 Acute Renal Failure - likely from outlet obstruction. CTa/p non-con as some hydro to verify no other obstrucitve sources (pelvic neoplasm / stones). Agree with MIVF in addition to PO intake given some post-obstructive diuresis.   WE will request  outpatient trial of void in few weeks. Please call me directly with questions anytime.   Alexis Frock 08/14/2018, 1:28 PM

## 2018-08-15 DIAGNOSIS — R338 Other retention of urine: Secondary | ICD-10-CM

## 2018-08-15 LAB — BASIC METABOLIC PANEL
Anion gap: 5 (ref 5–15)
BUN: 33 mg/dL — AB (ref 6–20)
CALCIUM: 9 mg/dL (ref 8.9–10.3)
CO2: 24 mmol/L (ref 22–32)
Chloride: 109 mmol/L (ref 98–111)
Creatinine, Ser: 3.12 mg/dL — ABNORMAL HIGH (ref 0.61–1.24)
GFR calc Af Amer: 25 mL/min — ABNORMAL LOW (ref >60)
GFR calc non Af Amer: 22 mL/min — ABNORMAL LOW (ref >60)
Glucose, Bld: 121 mg/dL — ABNORMAL HIGH (ref 70–99)
Potassium: 4.7 mmol/L (ref 3.5–5.1)
Sodium: 138 mmol/L (ref 135–145)

## 2018-08-15 LAB — MAGNESIUM: Magnesium: 2 mg/dL (ref 1.7–2.4)

## 2018-08-15 LAB — URINE CULTURE: CULTURE: NO GROWTH

## 2018-08-15 MED ORDER — ONDANSETRON HCL 4 MG PO TABS: 4.0000 mg | ORAL_TABLET | Freq: Four times a day (QID) | ORAL | 0 refills | Status: DC | PRN

## 2018-08-15 MED ORDER — SILODOSIN 4 MG PO CAPS: 4.0000 mg | ORAL_CAPSULE | Freq: Every day | ORAL | 0 refills | Status: DC

## 2018-08-15 NOTE — Discharge Summary (Signed)
Physician Discharge Summary  Daniel Mccarty LPF:[id number] DOB: 11/13/1965 DOA: 08/13/2018  PCP: Patient, No Pcp Per  Admit date: 08/13/2018 Discharge date: 08/15/2018  Admitted From: Home Disposition:  Home  Recommendations for Outpatient Follow-up:  1. Follow up with PCP in 1 week with repeat BMP 2. Outpatient follow-up with urology 3. Follow up in ED if symptoms worsen or new appear   Home Health: No Equipment/Devices: Foley catheter  Discharge Condition: Stable CODE STATUS: Full Diet recommendation: Heart healthy  Brief/Interim Summary: 53 year old male with history of hypertension and urinary problems presented with difficulty voiding and was found to be in acute kidney injury with creatinine of 4.51.  Foley catheter was placed.  Patient was seen by urology who recommended outpatient follow-up.  Creatinine is slightly improving.  Patient is making good urine.  He will be discharged home.  Discharge Diagnoses:  Active Problems:   Acute renal insufficiency   Bladder outlet obstruction   Essential hypertension   Hepatic lesion  Acute urinary retention status post Foley catheter placement -Probably from prostatic enlargement.  CT scan of the abdomen showed bilateral mild hydronephrosis.  Continue finasteride.  Rapaflo was added by urology.  Continue Rapaflo.  Outpatient follow-up with urology in the next few weeks for voiding trial.  Patient might need urodynamic studies/cystoscopy as an outpatient.  Discharge home with Foley catheter.  Acute kidney injury  -From obstruction.  Treated with IV fluids.  Creatinine improving.  Patient is making good urine output.  Patient will need repeat BMP within a week and follow-up with PCP.  Hypertension -Continue Norvasc.  Outpatient follow-up  Hepatic lesions -Most likely hemangioma.  Will need nonemergent outpatient CT or MRI with contrast.  Discharge Instructions  Discharge Instructions    Call MD for:  extreme fatigue   Complete  by:  As directed    Call MD for:  persistant dizziness or light-headedness   Complete by:  As directed    Call MD for:  persistant nausea and vomiting   Complete by:  As directed    Call MD for:  temperature >100.4   Complete by:  As directed    Diet - low sodium heart healthy   Complete by:  As directed    Increase activity slowly   Complete by:  As directed      Allergies as of 08/15/2018   No Known Allergies     Medication List    TAKE these medications   amLODipine 5 MG tablet Commonly known as:  NORVASC Take 1 tablet (5 mg total) by mouth daily.   finasteride 5 MG tablet Commonly known as:  PROSCAR Take 1 tablet (5 mg total) by mouth daily.   ondansetron 4 MG tablet Commonly known as:  ZOFRAN Take 1 tablet (4 mg total) by mouth every 6 (six) hours as needed for nausea.   silodosin 4 MG Caps capsule Commonly known as:  RAPAFLO Take 1 capsule (4 mg total) by mouth daily with breakfast. Start taking on:  August 16, 2018      Follow-up Information    Alexis Frock, MD. Schedule an appointment as soon as possible for a visit in 1 week(s).   Specialty:  Urology Contact information: [place] [address] [phone]        PCP. Schedule an appointment as soon as possible for a visit in 1 week(s).   Why:  With repeat BMP         No Known Allergies  Consultations:  Urology  Procedures/Studies: Ct Abdomen Pelvis Wo Contrast  Result Date: 08/14/2018 CLINICAL DATA:  Right greater than left hydronephrosis seen on ultrasound yesterday. EXAM: CT ABDOMEN AND PELVIS WITHOUT CONTRAST TECHNIQUE: Multidetector CT imaging of the abdomen and pelvis was performed following the standard protocol without IV contrast. COMPARISON:  Ultrasound 08/13/2018 FINDINGS: Lower chest: Lung bases are clear. No effusions. Heart is normal size. Hepatobiliary: Gallbladder is contracted. There is a 3.8 cm ill-defined low-density area noted posteriorly in the right  hepatic lobe corresponding to the hyperechoic lesion seen on prior ultrasound most likely hemangioma. No biliary ductal dilatation. Pancreas: No focal abnormality or ductal dilatation. Spleen: No focal abnormality.  Normal size. Adrenals/Urinary Tract: Mild bilateral hydronephrosis. No visible ureteral stone. Calcifications in the pelvis bilaterally compatible with phleboliths. Bladder wall is thickened. Bladder is decompressed with Foley catheter in place. Adrenal glands unremarkable. Stomach/Bowel: Stomach, large and small bowel grossly unremarkable. Normal appendix. Vascular/Lymphatic: No significant vascular findings are present. No enlarged abdominal or pelvic lymph nodes. Reproductive: Mildly prominent prostate measuring 5.1 x 4.9 cm on axial image 87. The craniocaudal size of the prostate is difficult to accurately measure, but approximately 6.8 cm on sagittal image 61. Other: No free fluid or free air. Musculoskeletal: No acute bony abnormality. IMPRESSION: Mild bilateral hydronephrosis. Foley catheter present in the bladder. Bladder wall is thickened with mild prominence of the prostate. Ill-defined low-density lesion posteriorly in the liver likely reflects hyperechoic lesion seen on prior ultrasound, most compatible with hemangioma. Electronically Signed   By: Rolm Baptise M.D.   On: 08/14/2018 17:33   US Renal  Result Date: 08/13/2018 CLINICAL DATA:  Elevated creatinine EXAM: RENAL / URINARY TRACT ULTRASOUND COMPLETE COMPARISON:  None. FINDINGS: Right Kidney: Renal measurements: 11.3 x 4.9 x 5.4 cm = volume: 156.5 mL . Echogenicity and renal cortical thickness are within normal limits. No mass or perinephric fluid visualized. There is slight hydronephrosis. There is no sonographically demonstrable calculus or ureterectasis. Left Kidney: Renal measurements: 10.9 x 6.1 x 5.8 cm = volume: 201.6 mL. Echogenicity and renal cortical thickness are within normal limits. No mass or perinephric fluid  visualized. There is equivocal hydronephrosis on the left. No sonographically demonstrable calculus or ureterectasis. Bladder: Urinary bladder is decompressed with a Foley catheter. There does appear to be thickening of the wall of the urinary bladder. There is an echogenic lesion in the right lobe of the liver anteriorly measuring 3.5 x 3.5 x 3.3 cm. A second echogenic focus is noted in the right lobe of the liver which has a central area of decreased attenuation measuring 2.4 x 2.2 x 2.1 cm. IMPRESSION: 1. Slight hydronephrosis on the right. Borderline hydronephrosis on the left. No obstructing foci evident. Kidneys otherwise appear unremarkable. 2. Urinary bladder decompressed with Foley catheter. Question cystitis given apparent urinary bladder wall thickening. 3. Echogenic lesions in the liver. The larger lesion has an appearance suggesting hemangioma. The smaller lesion in the right lobe of the liver does not represent a classic hemangioma by ultrasound. Given this circumstance, nonemergent pre and serial post-contrast CT or MR of the liver is advised to further evaluate. Electronically Signed   By: Lowella Grip III M.D.   On: 08/13/2018 21:22       Subjective: Patient seen and examined at bedside.  No overnight fever, nausea or vomiting.  Feels well and wants to go home.  Discharge Exam: Vitals:   08/14/18 2332 08/15/18 0530  BP: (!) 154/89 (!) 154/87  Pulse: 70 65  Resp: 18 18  Temp: 98.7 F (37.1 C) 97.8 F (36.6 C)  SpO2: 98% 99%   Vitals:   08/14/18 1107 08/14/18 1343 08/14/18 2332 08/15/18 0530  BP: (!) 180/96 (!) 156/92 (!) 154/89 (!) 154/87  Pulse: 80 65 70 65  Resp:  18 18 18   Temp: 98.7 F (37.1 C) 98.4 F (36.9 C) 98.7 F (37.1 C) 97.8 F (36.6 C)  TempSrc: Oral Oral Oral Oral  SpO2: 100% 99% 98% 99%  Weight:    99 kg  Height:        General: Pt is alert, awake, not in acute distress Cardiovascular: rate controlled, S1/S2 + Respiratory: bilateral decreased  breath sounds at bases Abdominal: Soft, NT, ND, bowel sounds + Extremities: no edema, no cyanosis    The results of significant diagnostics from this hospitalization (including imaging, microbiology, ancillary and laboratory) are listed below for reference.     Microbiology: Recent Results (from the past 240 hour(s))  MRSA PCR Screening     Status: None   Collection Time: 08/14/18  1:32 AM  Result Value Ref Range Status   MRSA by PCR NEGATIVE NEGATIVE Final    Comment:        The GeneXpert MRSA Assay (FDA approved for NASAL specimens only), is one component of a comprehensive MRSA colonization surveillance program. It is not intended to diagnose MRSA infection nor to guide or monitor treatment for MRSA infections. Performed at [facility], [place] [address] [place], [place] [address]      Labs: BNP (last 3 results) No results for input(s): BNP in the last 8760 hours. Basic Metabolic Panel: Recent Labs  Lab 08/09/18 1321 08/13/18 2043 08/15/18 0728  NA 140 139 138  K 4.7 4.5 4.7  CL 110 109 109  CO2 23 23 24   GLUCOSE 106* 103* 121*  BUN 50* 47* 33*  CREATININE 4.63* 4.51* 3.12*  CALCIUM 9.2 9.3 9.0  MG  --   --  2.0   Liver Function Tests: Recent Labs  Lab 08/09/18 1321 08/13/18 2043  AST 19 15  ALT 23 21  ALKPHOS 54 53  BILITOT 0.6 0.7  PROT 7.7 7.9  ALBUMIN 4.4 4.3   No results for input(s): LIPASE, AMYLASE in the last 168 hours. No results for input(s): AMMONIA in the last 168 hours. CBC: Recent Labs  Lab 08/13/18 2043  WBC 7.5  NEUTROABS 4.8  HGB 12.3*  HCT 39.0  MCV 90.5  PLT 232   Cardiac Enzymes: No results for input(s): CKTOTAL, CKMB, CKMBINDEX, TROPONINI in the last 168 hours. BNP: Invalid input(s): POCBNP CBG: No results for input(s): GLUCAP in the last 168 hours. D-Dimer No results for input(s): DDIMER in the last 72 hours. Hgb A1c No results for input(s): HGBA1C in the last 72 hours. Lipid Profile No results for  input(s): CHOL, HDL, LDLCALC, TRIG, CHOLHDL, LDLDIRECT in the last 72 hours. Thyroid function studies No results for input(s): TSH, T4TOTAL, T3FREE, THYROIDAB in the last 72 hours.  Invalid input(s): FREET3 Anemia work up No results for input(s): VITAMINB12, FOLATE, FERRITIN, TIBC, IRON, RETICCTPCT in the last 72 hours. Urinalysis    Component Value Date/Time   COLORURINE STRAW (A) 08/13/2018 1719   APPEARANCEUR CLEAR 08/13/2018 1719   LABSPEC 1.005 08/13/2018 1719   PHURINE 6.0 08/13/2018 1719   GLUCOSEU NEGATIVE 08/13/2018 1719   HGBUR NEGATIVE 08/13/2018 1719   BILIRUBINUR NEGATIVE 08/13/2018 1719   [place] 08/13/2018 1719   PROTEINUR NEGATIVE 08/13/2018 1719   UROBILINOGEN 0.2 08/09/2018  [place] 08/13/2018 1719   LEUKOCYTESUR TRACE (A) 08/13/2018 1719   Sepsis Labs Invalid input(s): PROCALCITONIN,  WBC,  LACTICIDVEN Microbiology Recent Results (from the past 240 hour(s))  MRSA PCR Screening     Status: None   Collection Time: 08/14/18  1:32 AM  Result Value Ref Range Status   MRSA by PCR NEGATIVE NEGATIVE Final    Comment:        The GeneXpert MRSA Assay (FDA approved for NASAL specimens only), is one component of a comprehensive MRSA colonization surveillance program. It is not intended to diagnose MRSA infection nor to guide or monitor treatment for MRSA infections. Performed at [facility], [place] [address] [place], [place] [address]      Time coordinating discharge: 35 minutes  SIGNED:   Aline August, MD  [facility] 08/15/2018, 9:48 AM

## 2018-08-15 NOTE — Discharge Instructions (Signed)
Foley care  Indwelling Urinary Catheter Insertion, Care After This sheet gives you information about how to care for yourself after your procedure. Your health care provider may also give you more specific instructions. If you have problems or questions, contact your health care provider. What can I expect after the procedure? After the procedure, it is common to have:  Slight discomfort around your urethra where the catheter enters your body. Follow these instructions at home:   Keep the drainage bag at or below the level of your bladder. Doing this ensures that urine can only drain out, not back into your body.  Secure the catheter tubing and drainage bag to your leg or thigh to keep it from moving.  Check the catheter tubing regularly to make sure there are no kinks or blockages.  Take showers daily to keep the catheter clean. Do not take a bath.  Do not pull on your catheter or try to remove it.  Disconnect the tubing and drainage bag as little as possible.  Empty the drainage bag every 2-4 hours, or more often if needed. Do not let the bag get completely full.  Wash your hands with soap and water before and after touching the catheter, tubing, or drainage bag.  Do not let the drainage bag or catheter tubing touch the floor.  Drink enough fluids to keep your urine clear or pale yellow, or as told by your health care provider. Contact a health care provider if:  Urine stops flowing into the drainage bag.  You feel pain or pressure in the bladder area.  You have back pain.  Your catheter gets clogged.  Your catheter starts to leak.  Your urine looks cloudy.  Your drainage bag or tubing looks dirty.  You notice a bad smell when emptying your drainage bag. Get help right away if:  You have a fever or chills.  You have severe pain in your back or your lower abdomen.  You have warmth, redness, swelling, or pain in the urethra area.  You notice blood in your  urine.  Your catheter gets pulled out. Summary  Do not pull on your catheter or try to remove it.  Keep the drainage bag at or below the level of your bladder, but do not let the drainage bag or catheter tubing touch the floor.  Wash your hands with soap and water before and after touching the catheter, tubing, or drainage bag.  Contact your health care provider if you have a fever, chills, or any other signs of infection. This information is not intended to replace advice given to you by your health care provider. Make sure you discuss any questions you have with your health care provider. Document Released: 06/28/2016 Document Revised: 06/28/2016 Document Reviewed: 06/28/2016 Elsevier Interactive Patient Education  2019 [employer].

## 2018-08-15 NOTE — Progress Notes (Signed)
Subjective/Chief Complaint:  1 - Urinary Retention - worsening LUTS x months 2020. Started on finasteride 07/2018. Developed frank retention with 1.5L in bladder 08/2018 and foley placed at [facility] 3/13. DRE 08/2018 >80gm smooth. CT 08/2018 with 14mL prostate with median lobe (ellipsoid calculation) and thickened baldder c/w outlet obstruction.   2 - Prostate Screening - No FHX prostate cancer 08/2018 DRE 80gm+ smooth.   3 - Stage 4 Acute Renal Failure - Cr 4's on ER labs 08/2018. K normal. In [place] retention. [place] with mild Rt>Lt hydro. Unclear Cr baseline. Foley and MIVF started with vigousou UOP. Cr trending down with foley now 3's.   Today "[name]" is feeling good, wants to go home. CT yesterday just with large prostate, no stones / pelvic masses.   Objective: Vital signs in last 24 hours: Temp:  [97.8 F (36.6 C)-98.7 F (37.1 C)] 97.8 F (36.6 C) (03/15 0530) Pulse Rate:  [65-70] 65 (03/15 0530) Resp:  [18] 18 (03/15 0530) BP: (154-156)/(87-92) 154/87 (03/15 0530) SpO2:  [98 %-99 %] 99 % (03/15 0530) Weight:  [99 kg] 99 kg (03/15 0530) Last BM Date: 08/13/18  Intake/Output from previous day: 03/14 0701 - 03/15 0700 In: 3520.5 [P.O.:720; I.V.:2800.5] Out: 4944 [Urine:6725] Intake/Output this shift: Total I/O In: 694.7 [P.O.:480; I.V.:214.7] Out: 900 [Urine:900]  Physical Exam  Constitutional: He appears well-developed.  Appears younger than stated age. Very pleasant. Athletic build.   Eyes: Pupils are equal, round, and reactive to light.  Neck: Normal range of motion.  Cardiovascular: Normal rate.  Respiratory: Effort normal.  GI: Soft.  Genitourinary:   foley in place to leg bag Musculoskeletal: Normal range of motion.  Neurological: He is alert.  Skin: Skin is warm.  Psychiatric: He has a normal   Lab Results:  Recent Labs    08/13/18 2043  WBC 7.5  HGB 12.3*  HCT 39.0  PLT 232   BMET Recent Labs    08/13/18 2043 08/15/18 0728  NA 139 138  K 4.5  4.7  CL 109 109  CO2 23 24  GLUCOSE 103* 121*  BUN 47* 33*  CREATININE 4.51* 3.12*  CALCIUM 9.3 9.0   PT/INR No results for input(s): LABPROT, INR in the last 72 hours. ABG No results for input(s): PHART, HCO3 in the last 72 hours.  Invalid input(s): PCO2, PO2  Studies/Results: Ct Abdomen Pelvis Wo Contrast  Result Date: 08/14/2018 CLINICAL DATA:  Right greater than left hydronephrosis seen on ultrasound yesterday. EXAM: CT ABDOMEN AND PELVIS WITHOUT CONTRAST TECHNIQUE: Multidetector CT imaging of the abdomen and pelvis was performed following the standard protocol without IV contrast. COMPARISON:  Ultrasound 08/13/2018 FINDINGS: Lower chest: Lung bases are clear. No effusions. Heart is normal size. Hepatobiliary: Gallbladder is contracted. There is a 3.8 cm ill-defined low-density area noted posteriorly in the right hepatic lobe corresponding to the hyperechoic lesion seen on prior ultrasound most likely hemangioma. No biliary ductal dilatation. Pancreas: No focal abnormality or ductal dilatation. Spleen: No focal abnormality.  Normal size. Adrenals/Urinary Tract: Mild bilateral hydronephrosis. No visible ureteral stone. Calcifications in the pelvis bilaterally compatible with phleboliths. Bladder wall is thickened. Bladder is decompressed with Foley catheter in place. Adrenal glands unremarkable. Stomach/Bowel: Stomach, large and small bowel grossly unremarkable. Normal appendix. Vascular/Lymphatic: No significant vascular findings are present. No enlarged abdominal or pelvic lymph nodes. Reproductive: Mildly prominent prostate measuring 5.1 x 4.9 cm on axial image 87. The craniocaudal size of the prostate is difficult to accurately measure, but approximately 6.8 cm  on sagittal image 61. Other: No free fluid or free air. Musculoskeletal: No acute bony abnormality. IMPRESSION: Mild bilateral hydronephrosis. Foley catheter present in the bladder. Bladder wall is thickened with mild prominence of  the prostate. Ill-defined low-density lesion posteriorly in the liver likely reflects hyperechoic lesion seen on prior ultrasound, most compatible with hemangioma. Electronically Signed   By: Rolm Baptise M.D.   On: 08/14/2018 17:33   US Renal  Result Date: 08/13/2018 CLINICAL DATA:  Elevated creatinine EXAM: RENAL / URINARY TRACT ULTRASOUND COMPLETE COMPARISON:  None. FINDINGS: Right Kidney: Renal measurements: 11.3 x 4.9 x 5.4 cm = volume: 156.5 mL . Echogenicity and renal cortical thickness are within normal limits. No mass or perinephric fluid visualized. There is slight hydronephrosis. There is no sonographically demonstrable calculus or ureterectasis. Left Kidney: Renal measurements: 10.9 x 6.1 x 5.8 cm = volume: 201.6 mL. Echogenicity and renal cortical thickness are within normal limits. No mass or perinephric fluid visualized. There is equivocal hydronephrosis on the left. No sonographically demonstrable calculus or ureterectasis. Bladder: Urinary bladder is decompressed with a Foley catheter. There does appear to be thickening of the wall of the urinary bladder. There is an echogenic lesion in the right lobe of the liver anteriorly measuring 3.5 x 3.5 x 3.3 cm. A second echogenic focus is noted in the right lobe of the liver which has a central area of decreased attenuation measuring 2.4 x 2.2 x 2.1 cm. IMPRESSION: 1. Slight hydronephrosis on the right. Borderline hydronephrosis on the left. No obstructing foci evident. Kidneys otherwise appear unremarkable. 2. Urinary bladder decompressed with Foley catheter. Question cystitis given apparent urinary bladder wall thickening. 3. Echogenic lesions in the liver. The larger lesion has an appearance suggesting hemangioma. The smaller lesion in the right lobe of the liver does not represent a classic hemangioma by ultrasound. Given this circumstance, nonemergent pre and serial post-contrast CT or MR of the liver is advised to further evaluate. Electronically  Signed   By: Lowella Grip III M.D.   On: 08/13/2018 21:22    Anti-infectives: Anti-infectives (From admission, onward)   None      Assessment/Plan:  1 - Urinary Retention - likely from large prostate by exam and history. Continue current foley now and at DC. Continue finasteride + rapaflo at DC as well. Explained he will need outpatient trial of void in few weeks, and should he fail x several, then urodynamics / cysto prior to outlet procedure (likely TURP at his gland size).   2 - Prostate Screening - DRE normal. Consider PSA when catheter free as would be likley artifactually high at present.   3 - Stage 4 Acute Renal Failure - likely from outlet obstruction by history and imaging. Improving with foley. Hopefully he will have full GFR recovery.  Daniel Mccarty 08/15/2018

## 2020-06-02 DIAGNOSIS — N400 Enlarged prostate without lower urinary tract symptoms: Secondary | ICD-10-CM

## 2020-06-02 HISTORY — DX: Benign prostatic hyperplasia without lower urinary tract symptoms: N40.0

## 2020-10-31 DIAGNOSIS — N189 Chronic kidney disease, unspecified: Secondary | ICD-10-CM

## 2020-10-31 HISTORY — DX: Chronic kidney disease, unspecified: N18.9

## 2020-11-01 ENCOUNTER — Inpatient Hospital Stay (HOSPITAL_COMMUNITY): Payer: BC Managed Care – PPO

## 2020-11-01 ENCOUNTER — Inpatient Hospital Stay (HOSPITAL_COMMUNITY)
Admission: EM | Admit: 2020-11-01 | Discharge: 2020-11-13 | DRG: 673 | Disposition: A | Discharge status: Home / Self Care | Payer: BC Managed Care – PPO | Attending: Internal Medicine | Admitting: Internal Medicine

## 2020-11-01 ENCOUNTER — Other Ambulatory Visit: Payer: Self-pay

## 2020-11-01 ENCOUNTER — Encounter (HOSPITAL_COMMUNITY): Payer: Self-pay | Admitting: Emergency Medicine

## 2020-11-01 ENCOUNTER — Emergency Department (HOSPITAL_COMMUNITY): Payer: BC Managed Care – PPO

## 2020-11-01 DIAGNOSIS — E8809 Other disorders of plasma-protein metabolism, not elsewhere classified: Secondary | ICD-10-CM | POA: Diagnosis present

## 2020-11-01 DIAGNOSIS — N179 Acute kidney failure, unspecified: Secondary | ICD-10-CM | POA: Diagnosis present

## 2020-11-01 DIAGNOSIS — N401 Enlarged prostate with lower urinary tract symptoms: Secondary | ICD-10-CM | POA: Diagnosis present

## 2020-11-01 DIAGNOSIS — Z23 Encounter for immunization: Secondary | ICD-10-CM | POA: Diagnosis not present

## 2020-11-01 DIAGNOSIS — D631 Anemia in chronic kidney disease: Secondary | ICD-10-CM | POA: Diagnosis present

## 2020-11-01 DIAGNOSIS — Z9114 Patient's other noncompliance with medication regimen: Secondary | ICD-10-CM | POA: Diagnosis not present

## 2020-11-01 DIAGNOSIS — Z992 Dependence on renal dialysis: Secondary | ICD-10-CM

## 2020-11-01 DIAGNOSIS — D638 Anemia in other chronic diseases classified elsewhere: Secondary | ICD-10-CM | POA: Diagnosis present

## 2020-11-01 DIAGNOSIS — E872 Acidosis: Secondary | ICD-10-CM | POA: Diagnosis present

## 2020-11-01 DIAGNOSIS — I120 Hypertensive chronic kidney disease with stage 5 chronic kidney disease or end stage renal disease: Secondary | ICD-10-CM | POA: Diagnosis present

## 2020-11-01 DIAGNOSIS — E875 Hyperkalemia: Secondary | ICD-10-CM | POA: Diagnosis present

## 2020-11-01 DIAGNOSIS — N186 End stage renal disease: Secondary | ICD-10-CM | POA: Diagnosis present

## 2020-11-01 DIAGNOSIS — N136 Pyonephrosis: Secondary | ICD-10-CM | POA: Diagnosis present

## 2020-11-01 DIAGNOSIS — R338 Other retention of urine: Secondary | ICD-10-CM | POA: Diagnosis present

## 2020-11-01 DIAGNOSIS — I160 Hypertensive urgency: Secondary | ICD-10-CM | POA: Diagnosis present

## 2020-11-01 DIAGNOSIS — R339 Retention of urine, unspecified: Secondary | ICD-10-CM | POA: Diagnosis present

## 2020-11-01 DIAGNOSIS — I10 Essential (primary) hypertension: Secondary | ICD-10-CM | POA: Diagnosis present

## 2020-11-01 DIAGNOSIS — D649 Anemia, unspecified: Secondary | ICD-10-CM

## 2020-11-01 DIAGNOSIS — Z9119 Patient's noncompliance with other medical treatment and regimen: Secondary | ICD-10-CM

## 2020-11-01 DIAGNOSIS — Z79899 Other long term (current) drug therapy: Secondary | ICD-10-CM

## 2020-11-01 DIAGNOSIS — E440 Moderate protein-calorie malnutrition: Secondary | ICD-10-CM | POA: Diagnosis present

## 2020-11-01 DIAGNOSIS — N184 Chronic kidney disease, stage 4 (severe): Secondary | ICD-10-CM

## 2020-11-01 DIAGNOSIS — E876 Hypokalemia: Secondary | ICD-10-CM | POA: Diagnosis present

## 2020-11-01 DIAGNOSIS — Z20822 Contact with and (suspected) exposure to covid-19: Secondary | ICD-10-CM | POA: Diagnosis present

## 2020-11-01 DIAGNOSIS — N400 Enlarged prostate without lower urinary tract symptoms: Secondary | ICD-10-CM | POA: Diagnosis present

## 2020-11-01 DIAGNOSIS — N320 Bladder-neck obstruction: Secondary | ICD-10-CM | POA: Diagnosis present

## 2020-11-01 DIAGNOSIS — E871 Hypo-osmolality and hyponatremia: Secondary | ICD-10-CM | POA: Diagnosis present

## 2020-11-01 DIAGNOSIS — E8770 Fluid overload, unspecified: Secondary | ICD-10-CM | POA: Diagnosis present

## 2020-11-01 DIAGNOSIS — N289 Disorder of kidney and ureter, unspecified: Secondary | ICD-10-CM | POA: Diagnosis not present

## 2020-11-01 DIAGNOSIS — N2581 Secondary hyperparathyroidism of renal origin: Secondary | ICD-10-CM | POA: Diagnosis present

## 2020-11-01 LAB — RESP PANEL BY RT-PCR (FLU A&B, COVID) ARPGX2
Influenza A by PCR: NEGATIVE
Influenza B by PCR: NEGATIVE
SARS Coronavirus 2 by RT PCR: NEGATIVE

## 2020-11-01 LAB — CBC WITH DIFFERENTIAL/PLATELET
Abs Immature Granulocytes: 0.08 10*3/uL — ABNORMAL HIGH (ref 0.00–0.07)
Basophils Absolute: 0 10*3/uL (ref 0.0–0.1)
Basophils Relative: 0 %
Eosinophils Absolute: 0 10*3/uL (ref 0.0–0.5)
Eosinophils Relative: 0 %
HCT: 18.2 % — ABNORMAL LOW (ref 39.0–52.0)
Hemoglobin: 5.8 g/dL — CL (ref 13.0–17.0)
Immature Granulocytes: 1 %
Lymphocytes Relative: 3 %
Lymphs Abs: 0.2 10*3/uL — ABNORMAL LOW (ref 0.7–4.0)
MCH: 26.9 pg (ref 26.0–34.0)
MCHC: 31.9 g/dL (ref 30.0–36.0)
MCV: 84.3 fL (ref 80.0–100.0)
Monocytes Absolute: 0.6 10*3/uL (ref 0.1–1.0)
Monocytes Relative: 7 %
Neutro Abs: 7.5 10*3/uL (ref 1.7–7.7)
Neutrophils Relative %: 89 %
Platelets: 207 10*3/uL (ref 150–400)
RBC: 2.16 MIL/uL — ABNORMAL LOW (ref 4.22–5.81)
RDW: 12.6 % (ref 11.5–15.5)
WBC: 8.4 10*3/uL (ref 4.0–10.5)
nRBC: 0.2 % (ref 0.0–0.2)

## 2020-11-01 LAB — BASIC METABOLIC PANEL
BUN: 239 mg/dL — ABNORMAL HIGH (ref 6–20)
CO2: <7 mmol/L — ABNORMAL LOW (ref 22–32)
Calcium: 6.6 mg/dL — ABNORMAL LOW (ref 8.9–10.3)
Chloride: 96 mmol/L — ABNORMAL LOW (ref 98–111)
Creatinine, Ser: 33.39 mg/dL — ABNORMAL HIGH (ref 0.61–1.24)
GFR, Estimated: 1 mL/min — ABNORMAL LOW (ref >60)
Glucose, Bld: 132 mg/dL — ABNORMAL HIGH (ref 70–99)
Potassium: 4.7 mmol/L (ref 3.5–5.1)
Sodium: 130 mmol/L — ABNORMAL LOW (ref 135–145)

## 2020-11-01 LAB — URINALYSIS, ROUTINE W REFLEX MICROSCOPIC
Bilirubin Urine: NEGATIVE
Glucose, UA: 50 mg/dL — AB
Ketones, ur: NEGATIVE mg/dL
Nitrite: NEGATIVE
Protein, ur: 100 mg/dL — AB
Specific Gravity, Urine: 1.011 (ref 1.005–1.030)
WBC, UA: >50 WBC/hpf — ABNORMAL HIGH (ref 0–5)
pH: 6 (ref 5.0–8.0)

## 2020-11-01 LAB — CBC
HCT: 17.3 % — ABNORMAL LOW (ref 39.0–52.0)
Hemoglobin: 5.5 g/dL — CL (ref 13.0–17.0)
MCH: 27 pg (ref 26.0–34.0)
MCHC: 31.8 g/dL (ref 30.0–36.0)
MCV: 84.8 fL (ref 80.0–100.0)
Platelets: 200 10*3/uL (ref 150–400)
RBC: 2.04 MIL/uL — ABNORMAL LOW (ref 4.22–5.81)
RDW: 12.6 % (ref 11.5–15.5)
WBC: 8.3 10*3/uL (ref 4.0–10.5)
nRBC: 0.5 % — ABNORMAL HIGH (ref 0.0–0.2)

## 2020-11-01 LAB — HEPATIC FUNCTION PANEL
ALT: 11 U/L (ref 0–44)
AST: 11 U/L — ABNORMAL LOW (ref 15–41)
Albumin: 4.2 g/dL (ref 3.5–5.0)
Alkaline Phosphatase: 50 U/L (ref 38–126)
Bilirubin, Direct: <0.1 mg/dL (ref 0.0–0.2)
Total Bilirubin: 0.7 mg/dL (ref 0.3–1.2)
Total Protein: 8 g/dL (ref 6.5–8.1)

## 2020-11-01 LAB — ABO/RH: ABO/RH(D): O POS

## 2020-11-01 LAB — CREATININE, SERUM
Creatinine, Ser: 32.2 mg/dL — ABNORMAL HIGH (ref 0.61–1.24)
GFR, Estimated: 1 mL/min — ABNORMAL LOW (ref >60)

## 2020-11-01 LAB — POC OCCULT BLOOD, ED: Fecal Occult Bld: NEGATIVE

## 2020-11-01 LAB — PREPARE RBC (CROSSMATCH)

## 2020-11-01 LAB — HIV ANTIBODY (ROUTINE TESTING W REFLEX): HIV Screen 4th Generation wRfx: NONREACTIVE

## 2020-11-01 MED ORDER — SODIUM CHLORIDE 0.9% IV SOLUTION: Freq: Once | INTRAVENOUS | Status: AC

## 2020-11-01 MED ORDER — FINASTERIDE 5 MG PO TABS
5.0000 mg | ORAL_TABLET | Freq: Every day | ORAL | Status: DC
  Administered 2020-11-01 – 2020-11-13 (×13): 5 mg via ORAL
  Filled 2020-11-01 (×13): qty 1

## 2020-11-01 MED ORDER — AMLODIPINE BESYLATE 5 MG PO TABS
5.0000 mg | ORAL_TABLET | Freq: Every day | ORAL | Status: DC
  Administered 2020-11-01 – 2020-11-03 (×2): 5 mg via ORAL
  Filled 2020-11-01 (×2): qty 1

## 2020-11-01 MED ORDER — ACETAMINOPHEN 325 MG PO TABS: 650.0000 mg | ORAL_TABLET | Freq: Four times a day (QID) | ORAL | Status: DC | PRN

## 2020-11-01 MED ORDER — TAMSULOSIN HCL 0.4 MG PO CAPS
0.4000 mg | ORAL_CAPSULE | Freq: Every day | ORAL | Status: DC
  Administered 2020-11-02 – 2020-11-13 (×12): 0.4 mg via ORAL
  Filled 2020-11-01 (×12): qty 1

## 2020-11-01 MED ORDER — SENNOSIDES-DOCUSATE SODIUM 8.6-50 MG PO TABS
1.0000 | ORAL_TABLET | Freq: Every day | ORAL | Status: DC
  Administered 2020-11-01 – 2020-11-09 (×6): 1 via ORAL
  Filled 2020-11-01 (×7): qty 1

## 2020-11-01 MED ORDER — MELATONIN 3 MG PO TABS
3.0000 mg | ORAL_TABLET | Freq: Every evening | ORAL | Status: DC | PRN
  Filled 2020-11-01 (×2): qty 1

## 2020-11-01 MED ORDER — SODIUM CHLORIDE 0.9 % IV SOLN
1.0000 g | INTRAVENOUS | Status: DC
  Administered 2020-11-01 – 2020-11-02 (×2): 1 g via INTRAVENOUS
  Filled 2020-11-01 (×2): qty 10

## 2020-11-01 MED ORDER — SODIUM BICARBONATE 8.4 % IV SOLN
INTRAVENOUS | Status: DC
  Filled 2020-11-01 (×5): qty 1000

## 2020-11-01 MED ORDER — LABETALOL HCL 5 MG/ML IV SOLN: 5.0000 mg | INTRAVENOUS | Status: DC | PRN

## 2020-11-01 MED ORDER — PROCHLORPERAZINE EDISYLATE 10 MG/2ML IJ SOLN
5.0000 mg | Freq: Four times a day (QID) | INTRAMUSCULAR | Status: DC | PRN
  Administered 2020-11-03: 5 mg via INTRAVENOUS
  Filled 2020-11-01: qty 2

## 2020-11-01 MED ORDER — CALCIUM GLUCONATE-NACL 2-0.675 GM/100ML-% IV SOLN
2.0000 g | Freq: Once | INTRAVENOUS | Status: AC
  Administered 2020-11-01: 2000 mg via INTRAVENOUS
  Filled 2020-11-01: qty 100

## 2020-11-01 MED ORDER — HEPARIN SODIUM (PORCINE) 5000 UNIT/ML IJ SOLN
5000.0000 [IU] | Freq: Three times a day (TID) | INTRAMUSCULAR | Status: DC
  Administered 2020-11-01 – 2020-11-13 (×26): 5000 [IU] via SUBCUTANEOUS
  Filled 2020-11-01 (×25): qty 1

## 2020-11-01 NOTE — ED Triage Notes (Signed)
Pt reports hypertension, has not taken meds since 2020. Reports nausea intermittently. Pt reports feeling that he gets tired when walking, hx of kidney problems, prostate issues and htn.

## 2020-11-01 NOTE — ED Provider Notes (Signed)
Emergency Medicine Provider Triage Evaluation Note  Daniel Mccarty , a 55 y.o. male  was evaluated in triage.  Pt complains of hypertension.  Sent from [facility] [place] for high blood pressure reading.  Reports ongoing history of kidney and prostate issues.  Denies any headache, vision changes, chest pain, shortness of breath, numbness.  States that he was prescribed antihypertensive medication years ago but ran out and did not get it refilled.  Review of Systems  Positive: Kidney and prostate issues Negative: Headache, vision changes, chest pain, shortness of breath  Physical Exam  BP (!) 156/86 (BP Location: Right Arm)   Pulse (!) 103   Temp 99.1 F (37.3 C) (Oral)   Resp 16   SpO2 100%  Gen:   Awake, no distress Resp:  Normal effort MSK:   Moves extremities without difficulty Other:  No facial asymmetry noted.  No acute distress  Medical Decision Making  Medically screening exam initiated at 3:22 PM.  Appropriate orders placed.  Daniel Mccarty was informed that the remainder of the evaluation will be completed by another provider, this initial triage assessment does not replace that evaluation, and the importance of remaining in the ED until their evaluation is complete.  Will need lab work   Delia Heady, [place] 11/01/20 1523    Pattricia Boss, MD 11/05/20 [phone]

## 2020-11-01 NOTE — ED Notes (Signed)
Bladder Scan 1000ML

## 2020-11-01 NOTE — ED Notes (Signed)
This RN attempted to call report to [phone].

## 2020-11-01 NOTE — ED Notes (Signed)
This RN attempted to give report again & Antonio stated this pt was not assigned to a nurse yet. This RN will try to give report for the 3rd time as soon as possible.

## 2020-11-01 NOTE — ED Provider Notes (Signed)
[facility] EM[facility] DEPARTMENT Provider Note   CSN: [id number] Arrival date & time: 11/01/20  1511     History Chief Complaint  Patient presents with  . Hypertension    Daniel Mccarty is a 55 y.o. male.  Pt presents to the ED today from his PCP's office.  Pt has a hx of htn and kidney problems.  He was admitted in March of 2020 for renal failure due urinary retention and htn.  He was d/c with Norvasc.  He took them until the prescription ran out, but never went to get a refill.  Pt has not followed up with any doctor since his d/c.  He said he had a bad experience and did not want to see anyone.  Pt finally made an appt with his pcp which he went today.   He was sent here due to htn.  Pt said he feels very tired.  He feels sob with ambulation.  Some nausea.        History reviewed. No pertinent past medical history.  Patient Active Problem List   Diagnosis Date Noted  . Bladder outlet obstruction 08/14/2018  . Essential hypertension 08/14/2018  . Hepatic lesion 08/14/2018  . Acute renal insufficiency 08/13/2018    History reviewed. No pertinent surgical history.     No family history on file.  Social History   Tobacco Use  . Smoking status: Never Smoker  . Smokeless tobacco: Never Used  Substance Use Topics  . Alcohol use: Never  . Drug use: Never    Home Medications Prior to Admission medications   Medication Sig Start Date End Date Taking? Authorizing Provider  amLODipine (NORVASC) 5 MG tablet Take 1 tablet (5 mg total) by mouth daily. 08/09/18   Loura Halt A, NP  finasteride (PROSCAR) 5 MG tablet Take 1 tablet (5 mg total) by mouth daily. 07/23/18   Raylene Everts, MD  ondansetron (ZOFRAN) 4 MG tablet Take 1 tablet (4 mg total) by mouth every 6 (six) hours as needed for nausea. 08/15/18   Aline August, MD  silodosin (RAPAFLO) 4 MG CAPS capsule Take 1 capsule (4 mg total) by mouth daily with breakfast. 08/16/18   Aline August, MD     Allergies    Patient has no known allergies.  Review of Systems   Review of Systems  Neurological: Positive for weakness.  All other systems reviewed and are negative.   Physical Exam Updated Vital Signs BP (!) 178/97   Pulse 90   Temp 98.9 F (37.2 C) (Oral)   Resp 15   Ht 6\' 1"  (1.854 m)   Wt 79.8 kg   SpO2 100%   BMI 23.22 kg/m   Physical Exam Vitals and nursing note reviewed. Exam conducted with a chaperone present.  Constitutional:      Appearance: Normal appearance.  HENT:     Head: Normocephalic and atraumatic.     Right Ear: External ear normal.     Left Ear: External ear normal.     Nose: Nose normal.     Mouth/Throat:     Mouth: Mucous membranes are moist.     Pharynx: Oropharynx is clear.  Eyes:     Extraocular Movements: Extraocular movements intact.     Conjunctiva/sclera: Conjunctivae normal.     Pupils: Pupils are equal, round, and reactive to light.  Cardiovascular:     Rate and Rhythm: Normal rate and regular rhythm.     Pulses: Normal pulses.  Heart sounds: Normal heart sounds.  Pulmonary:     Effort: Pulmonary effort is normal.     Breath sounds: Normal breath sounds.  Abdominal:     General: Abdomen is flat. Bowel sounds are normal.     Palpations: Abdomen is soft.  Genitourinary:    Rectum: Guaiac result negative.  Musculoskeletal:        General: Normal range of motion.     Cervical back: Normal range of motion and neck supple.  Skin:    Capillary Refill: Capillary refill takes less than 2 seconds.     Coloration: Skin is pale.  Neurological:     General: No focal deficit present.     Mental Status: He is alert and oriented to person, place, and time.  Psychiatric:        Mood and Affect: Mood normal.        Behavior: Behavior normal.     ED Results / Procedures / Treatments   Labs (all labs ordered are listed, but only abnormal results are displayed) Labs Reviewed  BASIC METABOLIC PANEL - Abnormal; Notable for the  following components:      Result Value   Sodium 130 (*)    Chloride 96 (*)    CO2 <7 (*)    Glucose, Bld 132 (*)    BUN 239 (*)    Creatinine, Ser 33.39 (*)    Calcium 6.6 (*)    GFR, Estimated 1 (*)    All other components within normal limits  CBC WITH DIFFERENTIAL/PLATELET - Abnormal; Notable for the following components:   RBC 2.16 (*)    Hemoglobin 5.8 (*)    HCT 18.2 (*)    Lymphs Abs 0.2 (*)    Abs Immature Granulocytes 0.08 (*)    All other components within normal limits  HEPATIC FUNCTION PANEL - Abnormal; Notable for the following components:   AST 11 (*)    All other components within normal limits  RESP PANEL BY RT-PCR (FLU A&B, COVID) ARPGX2  URINALYSIS, ROUTINE W REFLEX MICROSCOPIC  POC OCCULT BLOOD, ED  TYPE AND SCREEN  PREPARE RBC (CROSSMATCH)  ABO/RH    EKG EKG Interpretation  Date/Time:  Thursday November 01 2020 15:16:24 EDT Ventricular Rate:  104 PR Interval:  146 QRS Duration: 94 QT Interval:  366 QTC Calculation: 481 R Axis:   -36 Text Interpretation: Sinus tachycardia Left axis deviation Minimal voltage criteria for LVH, may be normal variant ( Cornell product ) Abnormal ECG No old tracing to compare Confirmed by Isla Pence [phone]) on 11/01/2020 6:54:47 PM   Radiology DG Chest Portable 1 View  Result Date: 11/01/2020 CLINICAL DATA:  Acute kidney injury, hypertension EXAM: PORTABLE CHEST 1 VIEW COMPARISON:  None. FINDINGS: The heart size and mediastinal contours are within normal limits. Both lungs are clear. The visualized skeletal structures are unremarkable. IMPRESSION: Negative. Electronically Signed   By: Rolm Baptise M.D.   On: 11/01/2020 18:20    Procedures Procedures   Medications Ordered in ED Medications  0.9 %  sodium chloride infusion (Manually program via Guardrails IV Fluids) (has no administration in time range)  sodium bicarbonate 150 mEq in dextrose 5 % 1,150 mL infusion (has no administration in time range)  calcium  gluconate 2 g/ 100 mL sodium chloride IVPB (has no administration in time range)    ED Course  I have reviewed the triage vital signs and the nursing notes.  Pertinent labs & imaging results that were available during my care  of the patient were reviewed by me and considered in my medical decision making (see chart for details).    MDM Rules/Calculators/A&P                           Pt is now in kidney failure.  He was d/w Dr. Joylene Grapes (nephrology) who will see pt in consult tomorrow.  No need for emergent dialysis now.    Severe anemia is likely from kidney failure.  He reports no bloody or black stools and stool today is nl in color.  Guaiac neg.  Due to hx of urinary retention, bladder scan was done.  He had over 1000 cc of urine in his bladder.  Foley placed.  2 units of blood ordered for transfusion.  Pt d/w Dr. [place] Crane ([facility]) for admission.  CRITICAL CARE Performed by: Isla Pence   Total critical care time: 30 minutes  Critical care time was exclusive of separately billable procedures and treating other patients.  Critical care was necessary to treat or prevent imminent or life-threatening deterioration.  Critical care was time spent personally by me on the following activities: development of treatment plan with patient and/or surrogate as well as nursing, discussions with consultants, evaluation of patient's response to treatment, examination of patient, obtaining history from patient or surrogate, ordering and performing treatments and interventions, ordering and review of laboratory studies, ordering and review of radiographic studies, pulse oximetry and re-evaluation of patient's condition.  Final Clinical Impression(s) / ED Diagnoses Final diagnoses:  Acute renal failure superimposed on stage 4 chronic kidney disease, unspecified acute renal failure type ([place])  Symptomatic anemia  Urinary retention    Rx / DC Orders ED Discharge Orders    None        Isla Pence, MD 11/01/20 1933

## 2020-11-01 NOTE — ED Notes (Signed)
This RN attempted to call report to 2W. 

## 2020-11-01 NOTE — H&P (Addendum)
History and Physical  Daniel Mccarty YKD:[id number] DOB: 1966-01-09 DOA: 11/01/2020  Referring physician: Dr. Gilford Raid, [place] PCP: Patient, No Pcp Per (Inactive)  Outpatient Specialists: None Patient coming from: Home through his PCPs office.  Chief Complaint: Hypertensive urgency  HPI: Daniel Mccarty is a 55 y.o. male with medical history significant for essential hypertension, CKD, urinary retention, BPH, who presented to [facility] ED at the recommendation of his PCP due to hypertensive urgency with SBP in the 200s.  Patient has not seen a provider for more than 2 years.  He ran out of his oral antihypertensive and has not taken any prescribed medications for the last 2 years.  He finally made an appointment to see his PCP today due to worsening fatigue and with the hope of having refills of his prior medications.  Also reported nausea with 1 episode of vomiting 3 days ago.  Last bowel movement was 4 days ago and was dark in color.  Denies use of NSAIDs or abdominal pain.  No prior history of colonoscopy or endoscopy.  While at the PCPs office, his SBP was markedly elevated, greater than 200.  He was not provided with refills and instead was advised to go to the ED for further evaluation and management of his blood pressure.  While in the ED labs were drawn and resulted with significant abnormalities.  BUN 239, creatinine 33, serum bicarb less than 7, hemoglobin 5.8K.  EDP contacted nephrology who will see in consultation.  Patient was started on isotonic bicarb.  TRH, hospitalist team, was asked to admit.    ED Course:  T-max 99.1.  BP 178/97.  Pulse 90, respiratory 15, O2 saturation 100% on room air.  Lab studies remarkable for serum sodium 130, potassium 4.7, serum bicarb less than 7.  BUN to 39, creatinine 33 with baseline of 3, calcium 6.6, GFR of 1.  WBC 8.4, hemoglobin 5.8, hematocrit 18.2, MCV 84, platelet 207.  12-lead EKG, peaked T waves.  Sinus tachycardia with rate of 104, QTc 481.  Review of  Systems: Review of systems as noted in the HPI. All other systems reviewed and are negative.  Past medical history: Essential hypertension BPH Urinary retention CKD 4  Past surgical history: None reported.   Social History:  reports that he has never smoked. He has never used smokeless tobacco. He reports that he does not drink alcohol and does not use drugs.   No Known Allergies  Family history: Reports mother is healthy and not on any prescribed medications. Reports father has deceased at the age of 53, had alcoholism, per the patient, he drank himself to death. Denies any family history of renal failure or dialysis.  States he is the only child, has no siblings.  Prior to Admission medications   Medication Sig Start Date End Date Taking? Authorizing Provider  amLODipine (NORVASC) 5 MG tablet Take 1 tablet (5 mg total) by mouth daily. 08/09/18   Loura Halt A, NP  finasteride (PROSCAR) 5 MG tablet Take 1 tablet (5 mg total) by mouth daily. 07/23/18   Raylene Everts, MD  ondansetron (ZOFRAN) 4 MG tablet Take 1 tablet (4 mg total) by mouth every 6 (six) hours as needed for nausea. 08/15/18   Aline August, MD  silodosin (RAPAFLO) 4 MG CAPS capsule Take 1 capsule (4 mg total) by mouth daily with breakfast. 08/16/18   Aline August, MD    Physical Exam: BP (!) 178/97   Pulse 90   Temp 98.9 F (37.2 C) (Oral)  Resp 15   Ht 6\' 1"  (1.854 m)   Wt 79.8 kg   SpO2 100%   BMI 23.22 kg/m   . General: 55 y.o. year-old male well developed well nourished in no acute distress.  Alert and oriented x3. . Cardiovascular: Regular rate and rhythm with no rubs or gallops.  No thyromegaly or JVD noted.  No lower extremity edema. 2/4 pulses in all 4 extremities. Marland Kitchen Respiratory: Clear to auscultation with no wheezes or rales. Good inspiratory effort. . Abdomen: Soft nontender nondistended with normal bowel sounds x4 quadrants. . Muskuloskeletal: No cyanosis, clubbing or edema noted  bilaterally . Neuro: CN II-XII intact, strength, sensation, reflexes . Skin: No ulcerative lesions noted or rashes.  Dry skin affecting upper extremities bilaterally. Marland Kitchen Psychiatry: Judgement and insight appear normal. Mood is appropriate for condition and setting          Labs on Admission:  Basic Metabolic Panel: Recent Labs  Lab 11/01/20 1523  NA 130*  K 4.7  CL 96*  CO2 <7*  GLUCOSE 132*  BUN 239*  CREATININE 33.39*  CALCIUM 6.6*   Liver Function Tests: Recent Labs  Lab 11/01/20 1807  AST 11*  ALT 11  ALKPHOS 50  BILITOT 0.7  PROT 8.0  ALBUMIN 4.2   No results for input(s): LIPASE, AMYLASE in the last 168 hours. No results for input(s): AMMONIA in the last 168 hours. CBC: Recent Labs  Lab 11/01/20 1523  WBC 8.4  NEUTROABS 7.5  HGB 5.8*  HCT 18.2*  MCV 84.3  PLT 207   Cardiac Enzymes: No results for input(s): CKTOTAL, CKMB, CKMBINDEX, TROPONINI in the last 168 hours.  BNP (last 3 results) No results for input(s): BNP in the last 8760 hours.  ProBNP (last 3 results) No results for input(s): PROBNP in the last 8760 hours.  CBG: No results for input(s): GLUCAP in the last 168 hours.  Radiological Exams on Admission: DG Chest Portable 1 View  Result Date: 11/01/2020 CLINICAL DATA:  Acute kidney injury, hypertension EXAM: PORTABLE CHEST 1 VIEW COMPARISON:  None. FINDINGS: The heart size and mediastinal contours are within normal limits. Both lungs are clear. The visualized skeletal structures are unremarkable. IMPRESSION: Negative. Electronically Signed   By: Rolm Baptise M.D.   On: 11/01/2020 18:20    EKG: I independently viewed the EKG done and my findings are as followed: Peaked T waves.  Sinus tachycardia with rate of 104, QTc 481.   Assessment/Plan Present on Admission: . AKI (acute kidney injury) ([place])  Active Problems:   AKI (acute kidney injury) ([place])  AKI on CKD IV suspect multifactorial. Presented with acute urinary retention with prior  history of urinary retention. Baseline creatinine 3.1 with GFR of 25. Presented with creatinine of 33 with GFR of 1. Bladder scan revealed greater than 1000 cc within the bladder. Obtain renal ultrasound. Insert Foley catheter due to overstretched bladder. Avoid nephrotoxic agents Continue isotonic bicarb. Nephrology consulted, may require hemodialysis if no quick recovery, defer to nephrology. Repeat renal panel in the morning.  Marked bilateral hydronephrosis with renal cortical thinning and increased parenchymal echogenicity, suggesting this is chronic. Internal bladder debris which may be due to stasis or infection, mild bladder wall thickening Insert Foley catheter, may consider outpatient voiding trial at urology's office after 6 days. Start Rocephin empirically, DC if UA or urine culture are negative.  Presumptive UTI, POA Management per above  Severe metabolic acidosis secondary to advanced renal failure. Presented with serum bicarb less than 7, anion  gap not calculated. Obtain VBG Started on isotonic bicarb, continue. Nephrology consult  Severe symptomatic anemia likely secondary to advanced CKD, rule out GI source of blood loss. Presented with hemoglobin of 5.8 and generalized fatigue Reported last bowel movement was 4 days ago and dark. No prior history of colonoscopy or endoscopy. Obtain FOBT, iron studies.  May consider GI evaluation. 2 units PRBCs ordered to be transfused by EDP. No overt bleeding. Repeat CBC posttransfusion.  Hyperkalemia in the setting of advanced renal failure. Presented with serum potassium 4.7 with peaked T waves on twelve-lead EKG. Has received calcium gluconate Continue isotonic bicarb Repeat renal panel.  Hypocalcemia Calcium corrected for albumin 6.4 He has received 2 g of IV calcium gluconate Repeat BMP in the morning  Uremia Presented BUN 239, generalized weakness, nausea. Management per nephrology.  Acute urinary  retention More than 1000 cc from bladder scan Insert Foley catheter on 11/01/2020 Obtain urine analysis and urine culture.  Uncontrolled hypertension secondary to medication noncompliance. BP is not at goal Resume home oral antihypertensives. IV antihypertensives as needed with parameters. Monitor vital signs.  BPH Resume home regimen Monitor urine output  Generalized weakness/nausea suspect secondary to uremia Treat underlying conditions PT OT assessment Fall precautions IV antiemetics as needed    DVT prophylaxis: Subcu heparin 3 times daily.  Code Status: Full code.  Family Communication: None at bedside.  Patient reports that his mother makes his medical decision for him when he is not able to make them for himself.  Reports he has made her aware of his admission.  He declines admitter to place call to his mother.  Disposition Plan: Admit to progressive unit  Consults called: Nephrology.  Admission status: Inpatient status.  Patient will require at least 2 midnights for further evaluation and treatment of present condition.   Status is: Inpatient    Dispo:  Patient From: Home  Planned Disposition: Home, possibly on 11/04/2020 or when hemodynamically stable or when nephrology signs off.  Medically stable for discharge: No         Kayleen Memos MD [facility] Pager [phone]  If 7PM-7AM, please contact night-coverage [email or web address] Password [email or web address]  11/01/2020, 7:44 PM

## 2020-11-02 ENCOUNTER — Inpatient Hospital Stay (HOSPITAL_COMMUNITY): Payer: BC Managed Care – PPO

## 2020-11-02 ENCOUNTER — Encounter (HOSPITAL_COMMUNITY): Payer: Self-pay | Admitting: Internal Medicine

## 2020-11-02 DIAGNOSIS — R339 Retention of urine, unspecified: Secondary | ICD-10-CM

## 2020-11-02 DIAGNOSIS — D649 Anemia, unspecified: Secondary | ICD-10-CM

## 2020-11-02 HISTORY — PX: IR FLUORO GUIDE CV LINE RIGHT: IMG2283

## 2020-11-02 HISTORY — PX: IR US GUIDE VASC ACCESS RIGHT: IMG2390

## 2020-11-02 LAB — CBC
HCT: 20.6 % — ABNORMAL LOW (ref 39.0–52.0)
Hemoglobin: 7.2 g/dL — ABNORMAL LOW (ref 13.0–17.0)
MCH: 27.9 pg (ref 26.0–34.0)
MCHC: 35 g/dL (ref 30.0–36.0)
MCV: 79.8 fL — ABNORMAL LOW (ref 80.0–100.0)
Platelets: 172 10*3/uL (ref 150–400)
RBC: 2.58 MIL/uL — ABNORMAL LOW (ref 4.22–5.81)
RDW: 13.6 % (ref 11.5–15.5)
WBC: 8.5 10*3/uL (ref 4.0–10.5)
nRBC: 0 % (ref 0.0–0.2)

## 2020-11-02 LAB — SAVE SMEAR(SSMR), FOR PROVIDER SLIDE REVIEW

## 2020-11-02 LAB — BASIC METABOLIC PANEL
Anion gap: 29 — ABNORMAL HIGH (ref 5–15)
BUN: 231 mg/dL — ABNORMAL HIGH (ref 6–20)
CO2: 9 mmol/L — ABNORMAL LOW (ref 22–32)
Calcium: 6.3 mg/dL — CL (ref 8.9–10.3)
Chloride: 95 mmol/L — ABNORMAL LOW (ref 98–111)
Creatinine, Ser: 33.43 mg/dL — ABNORMAL HIGH (ref 0.61–1.24)
GFR, Estimated: 1 mL/min — ABNORMAL LOW (ref >60)
Glucose, Bld: 135 mg/dL — ABNORMAL HIGH (ref 70–99)
Potassium: 3.3 mmol/L — ABNORMAL LOW (ref 3.5–5.1)
Sodium: 133 mmol/L — ABNORMAL LOW (ref 135–145)

## 2020-11-02 LAB — BLOOD GAS, VENOUS
Acid-base deficit: 15.1 mmol/L — ABNORMAL HIGH (ref 0.0–2.0)
Bicarbonate: 10.4 mmol/L — ABNORMAL LOW (ref 20.0–28.0)
Drawn by: 4653
FIO2: 21
O2 Saturation: 92.2 %
Patient temperature: 37
pCO2, Ven: 23.3 mmHg — ABNORMAL LOW (ref 44.0–60.0)
pH, Ven: 7.274 (ref 7.250–7.430)
pO2, Ven: 68.6 mmHg — ABNORMAL HIGH (ref 32.0–45.0)

## 2020-11-02 LAB — RETICULOCYTES
Immature Retic Fract: 13.1 % (ref 2.3–15.9)
RBC.: 2.57 MIL/uL — ABNORMAL LOW (ref 4.22–5.81)
Retic Count, Absolute: 70.2 10*3/uL (ref 19.0–186.0)
Retic Ct Pct: 2.7 % (ref 0.4–3.1)

## 2020-11-02 LAB — PHOSPHORUS: Phosphorus: >30 mg/dL — ABNORMAL HIGH (ref 2.5–4.6)

## 2020-11-02 LAB — IRON AND TIBC
Iron: 123 ug/dL (ref 45–182)
Saturation Ratios: 60 % — ABNORMAL HIGH (ref 17.9–39.5)
TIBC: 206 ug/dL — ABNORMAL LOW (ref 250–450)
UIBC: 83 ug/dL

## 2020-11-02 LAB — MAGNESIUM: Magnesium: 1.6 mg/dL — ABNORMAL LOW (ref 1.7–2.4)

## 2020-11-02 LAB — HEPATITIS B SURFACE ANTIGEN: Hepatitis B Surface Ag: NONREACTIVE

## 2020-11-02 LAB — FERRITIN: Ferritin: 374 ng/mL — ABNORMAL HIGH (ref 24–336)

## 2020-11-02 LAB — FOLATE: Folate: 7.1 ng/mL (ref >5.9)

## 2020-11-02 LAB — HEPATITIS B CORE ANTIBODY, TOTAL: Hep B Core Total Ab: NONREACTIVE

## 2020-11-02 LAB — VITAMIN B12: Vitamin B-12: 1063 pg/mL — ABNORMAL HIGH (ref 180–914)

## 2020-11-02 MED ORDER — SODIUM CHLORIDE 0.9 % IV SOLN
INTRAVENOUS | Status: AC | PRN
  Administered 2020-11-02: 250 mL via INTRAVENOUS

## 2020-11-02 MED ORDER — HEPARIN SODIUM (PORCINE) 1000 UNIT/ML IJ SOLN
INTRAMUSCULAR | Status: AC
  Filled 2020-11-02: qty 1

## 2020-11-02 MED ORDER — CHLORHEXIDINE GLUCONATE CLOTH 2 % EX PADS
6.0000 | MEDICATED_PAD | Freq: Every day | CUTANEOUS | Status: DC
  Administered 2020-11-02 – 2020-11-13 (×11): 6 via TOPICAL

## 2020-11-02 MED ORDER — CEFAZOLIN SODIUM-DEXTROSE 2-4 GM/100ML-% IV SOLN
INTRAVENOUS | Status: AC
  Administered 2020-11-02: 2000 mg via INTRAVENOUS
  Filled 2020-11-02: qty 100

## 2020-11-02 MED ORDER — POTASSIUM CHLORIDE CRYS ER 20 MEQ PO TBCR
20.0000 meq | EXTENDED_RELEASE_TABLET | Freq: Once | ORAL | Status: AC
  Administered 2020-11-02: 20 meq via ORAL
  Filled 2020-11-02: qty 1

## 2020-11-02 MED ORDER — BOOST / RESOURCE BREEZE PO LIQD CUSTOM
1.0000 | Freq: Three times a day (TID) | ORAL | Status: DC
  Administered 2020-11-03 – 2020-11-13 (×24): 1 via ORAL

## 2020-11-02 MED ORDER — ONDANSETRON HCL 4 MG/2ML IJ SOLN
INTRAMUSCULAR | Status: AC
  Administered 2020-11-02: 2 mg via ARTERIOVENOUS_FISTULA
  Filled 2020-11-02: qty 2

## 2020-11-02 MED ORDER — CALCIUM ACETATE (PHOS BINDER) 667 MG PO CAPS
1334.0000 mg | ORAL_CAPSULE | Freq: Three times a day (TID) | ORAL | Status: DC
  Administered 2020-11-03 (×2): 1334 mg via ORAL
  Filled 2020-11-02 (×2): qty 2

## 2020-11-02 MED ORDER — LIDOCAINE-EPINEPHRINE 1 %-1:100000 IJ SOLN
INTRAMUSCULAR | Status: AC | PRN
  Administered 2020-11-02: 10 mL

## 2020-11-02 MED ORDER — ONDANSETRON 4 MG PO TBDP
4.0000 mg | ORAL_TABLET | Freq: Once | ORAL | Status: DC
  Filled 2020-11-02: qty 1

## 2020-11-02 MED ORDER — MIDAZOLAM HCL 2 MG/2ML IJ SOLN
INTRAMUSCULAR | Status: AC
  Filled 2020-11-02: qty 2

## 2020-11-02 MED ORDER — HEPARIN SODIUM (PORCINE) 1000 UNIT/ML IJ SOLN
INTRAMUSCULAR | Status: AC | PRN
  Administered 2020-11-02 (×2): 1.9 mL via INTRAVENOUS

## 2020-11-02 MED ORDER — MIDAZOLAM HCL 2 MG/2ML IJ SOLN
INTRAMUSCULAR | Status: AC | PRN
  Administered 2020-11-02: 1 mg via INTRAVENOUS

## 2020-11-02 MED ORDER — FENTANYL CITRATE (PF) 100 MCG/2ML IJ SOLN
INTRAMUSCULAR | Status: AC
  Filled 2020-11-02: qty 2

## 2020-11-02 MED ORDER — FENTANYL CITRATE (PF) 100 MCG/2ML IJ SOLN
INTRAMUSCULAR | Status: AC | PRN
  Administered 2020-11-02: 50 ug via INTRAVENOUS

## 2020-11-02 MED ORDER — LIDOCAINE-EPINEPHRINE 1 %-1:100000 IJ SOLN
INTRAMUSCULAR | Status: AC
  Filled 2020-11-02: qty 1

## 2020-11-02 NOTE — Progress Notes (Signed)
OT Cancellation Note  Patient Details Name: Daniel Mccarty MRN: [id number] DOB: 06-29-1965   Cancelled Treatment:    Reason Eval/Treat Not Completed: Patient at procedure or test/ unavailable. Pt having at a procedure then heading to dialysis after. OT will follow up as time allows.   Alexanderjames Berg H., OTR/L Acute Rehabilitation  Daleiza Bacchi Elane Yolanda Bonine 11/02/2020, 12:10 PM

## 2020-11-02 NOTE — Progress Notes (Signed)
PROGRESS NOTE    Rockney Grenz  DTO:[id number] DOB: 07-03-1965 DOA: 11/01/2020 PCP: Patient, No Pcp Per (Inactive)    Chief Complaint  Patient presents with  . Hypertension    Brief Narrative:  Tyrick Dunagan is a 55 y.o. male with medical history significant for essential hypertension, CKD, urinary retention, BPH, who presented to [facility] ED at the recommendation of his PCP due to hypertensive urgency with SBP in the 200s.  Patient has not seen a provider for more than 2 years.  He ran out of his oral antihypertensive and has not taken any prescribed medications for the last 2 years.  He finally made an appointment to see his PCP today due to worsening fatigue and with the hope of having refills of his prior medications. While in the ED labs were drawn and resulted with significant abnormalities.  BUN 239, creatinine 33, serum bicarb less than 7, hemoglobin 5.8K.  EDP contacted nephrology who will see in consultation.  Patient was started on isotonic bicarb.  TRH, hospitalist team, was asked to admit.   Nephrology consulted.  IR consulted for HD catheter placement Assessment & Plan:   Active Problems:   AKI (acute kidney injury) ([place])   Acute kidney injury with nongap metabolic acidosis in the setting of CKD from uncontrolled hypertension. Tunneled HD catheter placed today by IR. Avoid nephrotoxins and continue to monitor renal parameters. Continue with sodium bicarbonate drip.   Urinary obstruction Probably chronic secondary to BPH Foley catheter in place and continue with finasteride and and Flomax.    Urinary tract infection Urine cultures, continue with Rocephin.   Anemia of chronic disease probably secondary to CKD S/p PRBC transfusion and nephrology plan to start Aranesp today Transfuse to keep hemoglobin greater than 7.   Mild hypokalemia Replaced.   DVT prophylaxis: Heparin Code Status: Full code Family Communication: None at bedside Disposition:   Status is:  Inpatient  Remains inpatient appropriate because:Ongoing diagnostic testing needed not appropriate for outpatient work up and IV treatments appropriate due to intensity of illness or inability to take PO   Dispo:  Patient From: Home  Planned Disposition: Home  Medically stable for discharge: No         Consultants:   IR  NEPHROLOGY.    Procedures: none.   Antimicrobials:  Antibiotics Given (last 72 hours)    Date/Time Action Medication Dose Rate   11/01/20 2132 New Bag/Given   cefTRIAXone (ROCEPHIN) 1 g in sodium chloride 0.9 % 100 mL IVPB 1 g 200 mL/hr   11/02/20 1204 New Bag/Given   ceFAZolin (ANCEF) 2-4 GM/100ML-% IVPB 2,000 mg 200 mL/hr          Subjective: No chest pain or sob.   Objective: Vitals:   11/02/20 1245 11/02/20 1250 11/02/20 1300 11/02/20 1317  BP: 136/85 136/82 (!) 150/88 (!) 150/89  Pulse: 91 90 86 86  Resp: 11 11 16 16   Temp:    97.7 F (36.5 C)  TempSrc:    Oral  SpO2: 100% 100% 100% 100%  Weight:      Height:        Intake/Output Summary (Last 24 hours) at 11/02/2020 1559 Last data filed at 11/02/2020 1319 Gross per 24 hour  Intake 838.25 ml  Output 3550 ml  Net -2711.75 ml   Filed Weights   11/01/20 1528 11/01/20 2331 11/02/20 0500  Weight: 79.8 kg 78 kg 78 kg    Examination:  General exam: Appears calm and comfortable  Respiratory system: Clear to  auscultation. Respiratory effort normal. Cardiovascular system: S1 & S2 heard, RRR. No JVD,  No pedal edema. Gastrointestinal system: Abdomen is nondistended, soft and nontender.Normal bowel sounds heard. Central nervous system: Alert and oriented. No focal neurological deficits. Extremities: Symmetric 5 x 5 power. Skin: No rashes, lesions or ulcers Psychiatry: Mood & affect appropriate.     Data Reviewed: I have personally reviewed following labs and imaging studies  CBC: Recent Labs  Lab 11/01/20 1523 11/01/20 1940 11/02/20 0618  WBC 8.4 8.3 8.5  NEUTROABS 7.5  --    --   HGB 5.8* 5.5* 7.2*  HCT 18.2* 17.3* 20.6*  MCV 84.3 84.8 79.8*  PLT 207 200 825    Basic Metabolic Panel: Recent Labs  Lab 11/01/20 1523 11/01/20 1940 11/02/20 0618  NA 130*  --  133*  K 4.7  --  3.3*  CL 96*  --  95*  CO2 <7*  --  9*  GLUCOSE 132*  --  135*  BUN 239*  --  231*  CREATININE 33.39* 32.20* 33.43*  CALCIUM 6.6*  --  6.3*  MG  --   --  1.6*  PHOS  --   --  >30.0*    GFR: Estimated Creatinine Clearance: 2.8 mL/min (A) (by C-G formula based on SCr of 33.43 mg/dL (H)).  Liver Function Tests: Recent Labs  Lab 11/01/20 1807  AST 11*  ALT 11  ALKPHOS 50  BILITOT 0.7  PROT 8.0  ALBUMIN 4.2    CBG: No results for input(s): GLUCAP in the last 168 hours.   Recent Results (from the past 240 hour(s))  Resp Panel by RT-PCR (Flu A&B, Covid) Nasopharyngeal Swab     Status: None   Collection Time: 11/01/20  8:24 PM   Specimen: Nasopharyngeal Swab; Nasopharyngeal(NP) swabs in vial transport medium  Result Value Ref Range Status   SARS Coronavirus 2 by RT PCR NEGATIVE NEGATIVE Final    Comment: (NOTE) SARS-CoV-2 target nucleic acids are NOT DETECTED.  The SARS-CoV-2 RNA is generally detectable in upper respiratory specimens during the acute phase of infection. The lowest concentration of SARS-CoV-2 viral copies this assay can detect is 138 copies/mL. A negative result does not preclude SARS-Cov-2 infection and should not be used as the sole basis for treatment or other patient management decisions. A negative result may occur with  improper specimen collection/handling, submission of specimen other than nasopharyngeal swab, presence of viral mutation(s) within the areas targeted by this assay, and inadequate number of viral copies(<138 copies/mL). A negative result must be combined with clinical observations, patient history, and epidemiological information. The expected result is Negative.  Fact Sheet for Patients:   EntrepreneurPulse.com.au  Fact Sheet for Healthcare Providers:  IncredibleEmployment.be  This test is no t yet approved or cleared by the [place] FDA and  has been authorized for detection and/or diagnosis of SARS-CoV-2 by FDA under an Emergency Use Authorization (EUA). This EUA will remain  in effect (meaning this test can be used) for the duration of the COVID-19 declaration under Section 564(b)(1) of the Act, 21 U.S.C.section 360bbb-3(b)(1), unless the authorization is terminated  or revoked sooner.       Influenza A by PCR NEGATIVE NEGATIVE Final   Influenza B by PCR NEGATIVE NEGATIVE Final    Comment: (NOTE) The Xpert Xpress SARS-CoV-2/FLU/RSV plus assay is intended as an aid in the diagnosis of influenza from Nasopharyngeal swab specimens and should not be used as a sole basis for treatment. Nasal washings and aspirates are  unacceptable for Xpert Xpress SARS-CoV-2/FLU/RSV testing.  Fact Sheet for Patients: EntrepreneurPulse.com.au  Fact Sheet for Healthcare Providers: IncredibleEmployment.be  This test is not yet approved or cleared by the [place] FDA and has been authorized for detection and/or diagnosis of SARS-CoV-2 by FDA under an Emergency Use Authorization (EUA). This EUA will remain in effect (meaning this test can be used) for the duration of the COVID-19 declaration under Section 564(b)(1) of the Act, 21 U.S.C. section 360bbb-3(b)(1), unless the authorization is terminated or revoked.  Performed at [facility], [place] [address] [place], [place] [address]          Radiology Studies: US RENAL  Result Date: 11/01/2020 CLINICAL DATA:  Acute kidney injury. EXAM: RENAL / URINARY TRACT ULTRASOUND COMPLETE COMPARISON:  Abdominopelvic CT 08/14/2018 FINDINGS: Right Kidney: Renal measurements: 11.4 x 6.1 x 5.3 cm = volume: 194 mL. Marked hydronephrosis. Mild thinning of the  renal parenchyma with increased parenchymal echogenicity. No evidence of stones or focal lesion. Left Kidney: Renal measurements: 9.3 x 5.2 x 4.2 cm = volume: 107 mL. Marked hydronephrosis. Moderate thinning of the renal parenchyma with mild increased echogenicity. No evidence of stones or focal lesion. Bladder: Distended with prevoid bladder volume of 708 cc. Internal debris/low-level echoes. Patient had difficulty voiding, and postvoid bladder volume actually increased, 773 cc. There is mild diffuse bladder wall thickening. Other: None. IMPRESSION: 1. Marked bilateral hydronephrosis with renal cortical thinning and increased parenchymal echogenicity, suggesting this is chronic. The degree of hydronephrosis has progressed from March 2020 abdominal CT. 2. Distended urinary bladder. Postvoid bladder volume actually increased as patient was unable to void, with postvoid bladder volume of 773 cc. 3. Internal bladder debris which may be due to stasis or infection. Mild bladder wall thickening. Electronically Signed   By: Keith Rake M.D.   On: 11/01/2020 20:08   IR Fluoro Guide CV Line Right  Result Date: 11/02/2020 CLINICAL DATA:  Progressive renal insufficiency, needs access for hemodialysis EXAM: TUNNELED HEMODIALYSIS CATHETER PLACEMENT WITH ULTRASOUND AND FLUOROSCOPIC GUIDANCE TECHNIQUE: The procedure, risks, benefits, and alternatives were explained to the patient. Questions regarding the procedure were encouraged and answered. The patient understands and consents to the procedure. As antibiotic prophylaxis, cefazolin 2 g was ordered pre-procedure and administered intravenously within one hour of incision.Patency of the right IJ vein was confirmed with ultrasound with image documentation. An appropriate skin site was determined. Region was prepped using maximum barrier technique including cap and mask, sterile gown, sterile gloves, large sterile sheet, and Chlorhexidine as cutaneous antisepsis. The region  was infiltrated locally with 1% lidocaine. Intravenous Fentanyl 62mcg and Versed 1mg  were administered as conscious sedation during continuous monitoring of the patient's level of consciousness and physiological / cardiorespiratory status by the radiology RN, with a total moderate sedation time of 15 minutes. Under real-time ultrasound guidance, the right IJ vein was accessed with a 21 gauge micropuncture needle; the needle tip within the vein was confirmed with ultrasound image documentation. Needle exchanged over the 018 guidewire for transitional dilator, which allowed advancement of a Benson wire into the IVC. Over this, an MPA catheter was advanced. A Palindrome 23 hemodialysis catheter was tunneled from the right anterior chest wall approach to the right IJ dermatotomy site. The MPA catheter was exchanged over an Amplatz wire for serial vascular dilators which allow placement of a peel-away sheath, through which the catheter was advanced under intermittent fluoroscopy, positioned with its tips in the proximal and midright atrium. Spot chest radiograph confirms good  catheter position. No pneumothorax. Catheter was flushed and primed per protocol. Catheter secured externally with O Prolene sutures. The right IJ dermatotomy site was closed with Dermabond. COMPLICATIONS: COMPLICATIONS None immediate FLUOROSCOPY TIME:  48 seconds; 2 mGy COMPARISON:  None IMPRESSION: 1. Technically successful placement of tunneled right IJ hemodialysis catheter with ultrasound and fluoroscopic guidance. Ready for routine use. Electronically Signed   By: Lucrezia Europe M.D.   On: 11/02/2020 13:41   IR US Guide Vasc Access Right  Result Date: 11/02/2020 CLINICAL DATA:  Progressive renal insufficiency, needs access for hemodialysis EXAM: TUNNELED HEMODIALYSIS CATHETER PLACEMENT WITH ULTRASOUND AND FLUOROSCOPIC GUIDANCE TECHNIQUE: The procedure, risks, benefits, and alternatives were explained to the patient. Questions regarding the  procedure were encouraged and answered. The patient understands and consents to the procedure. As antibiotic prophylaxis, cefazolin 2 g was ordered pre-procedure and administered intravenously within one hour of incision.Patency of the right IJ vein was confirmed with ultrasound with image documentation. An appropriate skin site was determined. Region was prepped using maximum barrier technique including cap and mask, sterile gown, sterile gloves, large sterile sheet, and Chlorhexidine as cutaneous antisepsis. The region was infiltrated locally with 1% lidocaine. Intravenous Fentanyl 5mcg and Versed 1mg  were administered as conscious sedation during continuous monitoring of the patient's level of consciousness and physiological / cardiorespiratory status by the radiology RN, with a total moderate sedation time of 15 minutes. Under real-time ultrasound guidance, the right IJ vein was accessed with a 21 gauge micropuncture needle; the needle tip within the vein was confirmed with ultrasound image documentation. Needle exchanged over the 018 guidewire for transitional dilator, which allowed advancement of a Benson wire into the IVC. Over this, an MPA catheter was advanced. A Palindrome 23 hemodialysis catheter was tunneled from the right anterior chest wall approach to the right IJ dermatotomy site. The MPA catheter was exchanged over an Amplatz wire for serial vascular dilators which allow placement of a peel-away sheath, through which the catheter was advanced under intermittent fluoroscopy, positioned with its tips in the proximal and midright atrium. Spot chest radiograph confirms good catheter position. No pneumothorax. Catheter was flushed and primed per protocol. Catheter secured externally with O Prolene sutures. The right IJ dermatotomy site was closed with Dermabond. COMPLICATIONS: COMPLICATIONS None immediate FLUOROSCOPY TIME:  48 seconds; 2 mGy COMPARISON:  None IMPRESSION: 1. Technically successful  placement of tunneled right IJ hemodialysis catheter with ultrasound and fluoroscopic guidance. Ready for routine use. Electronically Signed   By: Lucrezia Europe M.D.   On: 11/02/2020 13:41   DG Chest Portable 1 View  Result Date: 11/01/2020 CLINICAL DATA:  Acute kidney injury, hypertension EXAM: PORTABLE CHEST 1 VIEW COMPARISON:  None. FINDINGS: The heart size and mediastinal contours are within normal limits. Both lungs are clear. The visualized skeletal structures are unremarkable. IMPRESSION: Negative. Electronically Signed   By: Rolm Baptise M.D.   On: 11/01/2020 18:20        Scheduled Meds: . amLODipine  5 mg Oral Daily  . calcium acetate  1,334 mg Oral TID WC  . Chlorhexidine Gluconate Cloth  6 each Topical Daily  . feeding supplement  1 Container Oral TID BM  . finasteride  5 mg Oral Daily  . heparin  5,000 Units Subcutaneous Q8H  . ondansetron  4 mg Oral Once  . senna-docusate  1 tablet Oral QHS  . tamsulosin  0.4 mg Oral QPC supper   Continuous Infusions: . cefTRIAXone (ROCEPHIN)  IV Stopped (11/01/20 2206)  . sodium bicarbonate  150 mEq in D5W infusion 150 mL/hr at 11/02/20 1430     LOS: 1 day        Hosie Poisson, MD [facility]   To contact the attending provider between 7A-7P or the covering provider during after hours 7P-7A, please log into the web site [email or web address] and access using universal [place] password for that web site. If you do not have the password, please call the hospital operator.  11/02/2020, 3:59 PM

## 2020-11-02 NOTE — Consult Note (Addendum)
Nephrology Consult   Requesting provider: Irene Pap, DO Service requesting consult: Hospitalist Reason for consult: Severe AKI on CKD   Assessment/Recommendations: Daniel Mccarty is a/an 55 y.o. male with a past medical history hypertension, BPH, urinary retention, possible CKD who present w/ severe AKI possibly progression of CKD  Nonoliguric severe AKI: Significant elevation in both BUN and creatinine.  Most of his clinical picture suggest this is a fairly chronic process likely related to obstruction but some contributions from hypertension and arterionephrosclerosis.  We discussed that he may need to start dialysis today once we see his labs this morning.  He is making quite a bit of urine which is reassuring but the rest of his clinical picture is grim -Agree with foley -NPO in case of need for dialysis catheter with IR today -Determine need for dialysis today based on labs this morning -Some uremic symptoms but mild at this time -Electrolyte disorder management as below -Continue to monitor daily Cr, Dose meds for GFR -Monitor Daily I/Os, Daily weight  -Maintain MAP>65 for optimal renal perfusion.  -Avoid nephrotoxic medications including NSAIDs and Vanc/Zosyn combo  Urinary tract infection: Associated with obstruction.  Agree with ceftriaxone.  Follow-up urine culture  Hypertension: Mild at this time.  Continue amlodipine  Urinary obstruction: Likely chronic related to BPH.  Continue finasteride and Flomax  Metabolic acidosis: Likely anion gap associated with renal failure.  Unfortunately venous blood gas was not obtained until this AM.  Continue IV sodium bicarb and follow-up labs this morning.  Adjust plan as needed.  Possibly dialysis  Hyponatremia: Secondary to free water retention in the setting of severe AKI.  Follow-up labs this morning  Hypocalcemia: Calcium 6.6.  2 g of calcium gluconate yesterday. 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 F/u  PTH and phos. May start calcitriol vs calcium supplementation after knowing phos level.  Severe anemia: Most likely related to longstanding worsening CKD.  Transfusion per primary team.  No iron given recent transfusion.  Likely start Aranesp based on hemoglobin this morning   Recommendations conveyed to primary service.    [facility] 11/02/2020 6:47 AM   _____________________________________________________________________________________ CC: AKI  History of Present Illness: Daniel Mccarty is a/an 55 y.o. male with a past medical history of hypertension, urinary retention, BPH, CKD who presents at the request of his primary care provider for hypertension.  The patient was hospitalized in March 2020 urinary retention.  He was found to have an AKI at that time with creatinine up to 4.5.  The patient was treated for his urinary retention with a Foley catheter and creatinine was improving to 3.1 prior to discharge.  It was unclear what his baseline was.  Unfortunately after discharge the patient failed to follow-up with any providers until yesterday when he establish care with a primary care provider.  Because of high blood pressures in the office it was recommended that he go to the emergency department for further evaluation.  States that he has been feeling more tired.  He has noted difficulties voiding.  No obvious blood in the urine.  Denies any NSAID use.  The emergency department his creatinine was found to be 33.4 with a BUN of 240.  Bicarb was undetectable and sodium was 130.  Hemoglobin was also less than 6.  Renal ultrasound demonstrated significant bilateral hydronephrosis with cortical thinning and suggestions that this was a chronic process.  Hydronephrosis was noted to have progressed since his imaging in March 2020.  Internal bladder  debris was present.  The patient had a Foley catheter placed and immediately 1000 mL of urine was obtained.   Urinalysis demonstrated significant pyuria and the patient was started on antibiotics.  Because of his acidosis patient was started on sodium bicarbonate.  The patient states he feels somewhat better today.  Difficulty saying exactly what improved.  Denies any significant confusion, shortness of breath, chest pain.   Medications:  Current Facility-Administered Medications  Medication Dose Route Frequency Provider Last Rate Last Admin  . acetaminophen (TYLENOL) tablet 650 mg  650 mg Oral Q6H PRN Irene Pap N, DO      . amLODipine (NORVASC) tablet 5 mg  5 mg Oral Daily Irene Pap N, DO   5 mg at 11/01/20 2037  . cefTRIAXone (ROCEPHIN) 1 g in sodium chloride 0.9 % 100 mL IVPB  1 g Intravenous Q24H Irene Pap N, DO   Stopped at 11/01/20 2206  . Chlorhexidine Gluconate Cloth 2 % PADS 6 each  6 each Topical Daily Irene Pap N, DO      . finasteride (PROSCAR) tablet 5 mg  5 mg Oral Daily [place], Carole N, DO   5 mg at 11/01/20 2036  . heparin injection 5,000 Units  5,000 Units Subcutaneous Q8H Kayleen Memos, DO   5,000 Units at 11/01/20 2127  . labetalol (NORMODYNE) injection 5 mg  5 mg Intravenous Q2H PRN Irene Pap N, DO      . melatonin tablet 3 mg  3 mg Oral QHS PRN Irene Pap N, DO      . prochlorperazine (COMPAZINE) injection 5 mg  5 mg Intravenous Q6H PRN Kayleen Memos, DO      . senna-docusate (Senokot-S) tablet 1 tablet  1 tablet Oral QHS Irene Pap N, DO   1 tablet at 11/01/20 2127  . sodium bicarbonate 150 mEq in dextrose 5 % 1,150 mL infusion   Intravenous Continuous Reesa Chew, MD 150 mL/hr at 11/01/20 2112 New Bag at 11/01/20 2112  . tamsulosin (FLOMAX) capsule 0.4 mg  0.4 mg Oral QPC supper Kayleen Memos, DO         ALLERGIES Patient has no known allergies.  MEDICAL HISTORY Urinary retention, essential hypertension  SOCIAL HISTORY Social History   Socioeconomic History  . Marital status: Single    Spouse name: Not on file  . Number of children: Not on  file  . Years of education: Not on file  . Highest education level: Not on file  Occupational History  . Not on file  Tobacco Use  . Smoking status: Never Smoker  . Smokeless tobacco: Never Used  Substance and Sexual Activity  . Alcohol use: Never  . Drug use: Never  . Sexual activity: Not on file  Other Topics Concern  . Not on file  Social History Narrative  . Not on file   Social Determinants of Health   Financial Resource Strain: Not on file  Food Insecurity: Not on file  Transportation Needs: Not on file  Physical Activity: Not on file  Stress: Not on file  Social Connections: Not on file  Intimate Partner Violence: Not on file     FAMILY HISTORY Father had alcoholism, no family history of kidney disease   Review of Systems: 12 systems reviewed Otherwise as per HPI, all other systems reviewed and negative  Physical Exam: Vitals:   11/02/20 0309 11/02/20 0500  BP: (!) 153/90 (!) 132/98  Pulse: 85 90  Resp: 18   Temp: 97.7  F (36.5 C) 98.8 F (37.1 C)  SpO2: 100% 100%   Total I/O In: 738.3 [I.V.:50; Blood:688.3] Out: 1400 [Urine:1400]  Intake/Output Summary (Last 24 hours) at 11/02/2020 5102 Last data filed at 11/02/2020 0309 Gross per 24 hour  Intake 738.25 ml  Output 1400 ml  Net -661.75 ml   General: well-appearing, no acute distress HEENT: anicteric sclera, oropharynx clear without lesions CV: Normal rate, no murmurs, no peripheral edema Lungs: Clear to auscultation anteriorly, normal work of breathing Abd: soft, non-tender, non-distended Skin: no visible lesions or rashes Psych: alert, engaged, appropriate mood and affect Musculoskeletal: no obvious deformities Neuro: normal speech, no gross focal deficits   Test Results Reviewed Lab Results  Component Value Date   NA 130 (L) 11/01/2020   K 4.7 11/01/2020   CL 96 (L) 11/01/2020   CO2 <7 (L) 11/01/2020   BUN 239 (H) 11/01/2020   CREATININE 32.20 (H) 11/01/2020   CALCIUM 6.6 (L)  11/01/2020   ALBUMIN 4.2 11/01/2020     I have reviewed all relevant outside healthcare records related to the patient's current hospitalization

## 2020-11-02 NOTE — Procedures (Signed)
  Procedure: R IJ tunnled HD CVC placement Palindrome 23 EBL:   minimal Complications:  none immediate  See full dictation in [facility].  Dillard Cannon MD Main # [phone] Pager  [phone] Mobile [phone]

## 2020-11-02 NOTE — Consult Note (Signed)
Chief Complaint: Patient was seen in consultation today for acute kidney injury  Referring Physician(s): Dr. Posey Pronto  Supervising Physician: Arne Cleveland  Patient Status: [facility] - In-pt  History of Present Illness: Daniel Mccarty is a 55 y.o. male with past medical history of HTN, BPH, urinary retention, who presents with severe AKI and suspected progression of CKD with significantly elevated Scr of 33, phos >30. IR consulted for tunneled dialysis catheter placement.   Daniel Mccarty is assessed at bedside.  She states he doesn't feel well, "but not that bad."  Complains of muscle shakes, weakness, and occasional burning with urination-- improved since foley placement.   He has been NPO today.   History reviewed. No pertinent past medical history.  History reviewed. No pertinent surgical history.  Allergies: Patient has no known allergies.  Medications: Prior to Admission medications   Medication Sig Start Date End Date Taking? Authorizing Provider  amLODipine (NORVASC) 5 MG tablet Take 1 tablet (5 mg total) by mouth daily. Patient not taking: Reported on 11/01/2020 08/09/18   Loura Halt A, NP  finasteride (PROSCAR) 5 MG tablet Take 1 tablet (5 mg total) by mouth daily. Patient not taking: Reported on 11/01/2020 07/23/18   Raylene Everts, MD  ondansetron (ZOFRAN) 4 MG tablet Take 1 tablet (4 mg total) by mouth every 6 (six) hours as needed for nausea. Patient not taking: Reported on 11/01/2020 08/15/18   Aline August, MD  silodosin (RAPAFLO) 4 MG CAPS capsule Take 1 capsule (4 mg total) by mouth daily with breakfast. Patient not taking: Reported on 11/01/2020 08/16/18   Aline August, MD     History reviewed. No pertinent family history.  Social History   Socioeconomic History  . Marital status: Single    Spouse name: Not on file  . Number of children: Not on file  . Years of education: Not on file  . Highest education level: Not on file  Occupational History  . Not on file   Tobacco Use  . Smoking status: Never Smoker  . Smokeless tobacco: Never Used  Substance and Sexual Activity  . Alcohol use: Never  . Drug use: Never  . Sexual activity: Not on file  Other Topics Concern  . Not on file  Social History Narrative  . Not on file   Social Determinants of Health   Financial Resource Strain: Not on file  Food Insecurity: Not on file  Transportation Needs: Not on file  Physical Activity: Not on file  Stress: Not on file  Social Connections: Not on file     Review of Systems: A 12 point ROS discussed and pertinent positives are indicated in the HPI above.  All other systems are negative.  Review of Systems  Constitutional: Positive for fatigue. Negative for fever.  Respiratory: Negative for cough and shortness of breath.   Cardiovascular: Negative for chest pain.  Gastrointestinal: Negative for abdominal pain, nausea and vomiting.  Genitourinary: Positive for dysuria. Negative for decreased urine volume and difficulty urinating.  Musculoskeletal: Negative for back pain.  Neurological: Positive for tremors.  Psychiatric/Behavioral: Negative for behavioral problems and confusion.    Vital Signs: BP (!) 143/91 (BP Location: Right Arm)   Pulse 92   Temp 97.6 F (36.4 C) (Oral)   Resp 18   Ht 6\' 1"  (1.854 m)   Wt 171 lb 14.4 oz (78 kg)   SpO2 100%   BMI 22.68 kg/m   Physical Exam Vitals and nursing note reviewed.  Constitutional:  General: He is not in acute distress.    Appearance: Normal appearance. He is ill-appearing.  HENT:     Mouth/Throat:     Mouth: Mucous membranes are moist.     Pharynx: Oropharynx is clear.  Cardiovascular:     Rate and Rhythm: Normal rate and regular rhythm.  Pulmonary:     Effort: Pulmonary effort is normal. No respiratory distress.     Breath sounds: Normal breath sounds.  Skin:    General: Skin is warm and dry.  Neurological:     General: No focal deficit present.     Mental Status: He is alert  and oriented to person, place, and time. Mental status is at baseline.  Psychiatric:        Mood and Affect: Mood normal.        Behavior: Behavior normal.        Thought Content: Thought content normal.        Judgment: Judgment normal.      MD Evaluation Airway: WNL Heart: WNL Abdomen: WNL Chest/ Lungs: WNL ASA  Classification: 3 Mallampati/Airway Score: One   Imaging: US RENAL  Result Date: 11/01/2020 CLINICAL DATA:  Acute kidney injury. EXAM: RENAL / URINARY TRACT ULTRASOUND COMPLETE COMPARISON:  Abdominopelvic CT 08/14/2018 FINDINGS: Right Kidney: Renal measurements: 11.4 x 6.1 x 5.3 cm = volume: 194 mL. Marked hydronephrosis. Mild thinning of the renal parenchyma with increased parenchymal echogenicity. No evidence of stones or focal lesion. Left Kidney: Renal measurements: 9.3 x 5.2 x 4.2 cm = volume: 107 mL. Marked hydronephrosis. Moderate thinning of the renal parenchyma with mild increased echogenicity. No evidence of stones or focal lesion. Bladder: Distended with prevoid bladder volume of 708 cc. Internal debris/low-level echoes. Patient had difficulty voiding, and postvoid bladder volume actually increased, 773 cc. There is mild diffuse bladder wall thickening. Other: None. IMPRESSION: 1. Marked bilateral hydronephrosis with renal cortical thinning and increased parenchymal echogenicity, suggesting this is chronic. The degree of hydronephrosis has progressed from March 2020 abdominal CT. 2. Distended urinary bladder. Postvoid bladder volume actually increased as patient was unable to void, with postvoid bladder volume of 773 cc. 3. Internal bladder debris which may be due to stasis or infection. Mild bladder wall thickening. Electronically Signed   By: Keith Rake M.D.   On: 11/01/2020 20:08   DG Chest Portable 1 View  Result Date: 11/01/2020 CLINICAL DATA:  Acute kidney injury, hypertension EXAM: PORTABLE CHEST 1 VIEW COMPARISON:  None. FINDINGS: The heart size and  mediastinal contours are within normal limits. Both lungs are clear. The visualized skeletal structures are unremarkable. IMPRESSION: Negative. Electronically Signed   By: Rolm Baptise M.D.   On: 11/01/2020 18:20    Labs:  CBC: Recent Labs    11/01/20 1523 11/01/20 1940 11/02/20 0618  WBC 8.4 8.3 8.5  HGB 5.8* 5.5* 7.2*  HCT 18.2* 17.3* 20.6*  PLT 207 200 172    COAGS: No results for input(s): INR, APTT in the last 8760 hours.  BMP: Recent Labs    11/01/20 1523 11/01/20 1940 11/02/20 0618  NA 130*  --  133*  K 4.7  --  3.3*  CL 96*  --  95*  CO2 <7*  --  9*  GLUCOSE 132*  --  135*  BUN 239*  --  231*  CALCIUM 6.6*  --  6.3*  CREATININE 33.39* 32.20* 33.43*  GFRNONAA 1* 1* 1*    LIVER FUNCTION TESTS: Recent Labs    11/01/20 1807  BILITOT  0.7  AST 11*  ALT 11  ALKPHOS 50  PROT 8.0  ALBUMIN 4.2    TUMOR MARKERS: No results for input(s): AFPTM, CEA, CA199, CHROMGRNA in the last 8760 hours.  Assessment and Plan: Acute kidney injury, suspected progression to chronic kidney disease Patient in need of dialysis initiation.  IR consulted for tunneled catheter placement.  Patient aware of plan of care and agreeable to procedure today.  NPO.   Risks and benefits discussed with the patient including, but not limited to bleeding, infection, vascular injury, pneumothorax which may require chest tube placement, air embolism or even death  All of the patient's questions were answered, patient is agreeable to proceed. Consent signed and in chart.   Thank you for this interesting consult.  I greatly enjoyed meeting Kodie Kishi and look forward to participating in their care.  A copy of this report was sent to the requesting provider on this date.  Electronically Signed: Docia Barrier, PA 11/02/2020, 11:22 AM   I spent a total of 40 Minutes    in face to face in clinical consultation, greater than 50% of which was counseling/coordinating care for acute  kidney injury.

## 2020-11-02 NOTE — Progress Notes (Signed)
Initial Nutrition Assessment  DOCUMENTATION CODES:   Not applicable  INTERVENTION:    Boost Breeze po TID, each supplement provides 250 kcal and 9 grams of protein  NUTRITION DIAGNOSIS:   Inadequate oral intake related to nausea as evidenced by per patient/family report.  GOAL:   Patient will meet greater than or equal to 90% of their needs  MONITOR:   PO intake,Supplement acceptance,Labs,Skin  REASON FOR ASSESSMENT:   Malnutrition Screening Tool    ASSESSMENT:   55 yo male admitted with severe AKI, suspected progression of CKD with serum creatinine of 33 and phosphorus > 30. PMH includes HTN, BPH, urinary retention.   R IJ tunneled HD CVC placed earlier today.  Patient c/o nausea. He was unable to answer many of RD's questions d/t feeling unwell. He states that he has lost 60-70 lbs, but unable to provide time frame of weight loss. Nutrition focused physical exam deferred per patient request.   Labs reviewed. Na 133, K 3.3, BUN 231, Creat 33.43, phos > 30, mag 1.6  Medications reviewed and include phoslo, potassium chloride, Senokot-S, Flomax, IV Rocephin. IVF: sodium bicarb in D5 at 150 ml/h  No recent weight history available for review. Most recent weight PTA was 99 kg on 08/15/18. Currently 78 kg.  Suspect patient is malnourished, unable to obtain enough information at this time for identification of malnutrition.    NUTRITION - FOCUSED PHYSICAL EXAM:  deferred  Diet Order:   Diet Order            Diet regular Room service appropriate? Yes; Fluid consistency: Thin  Diet effective now                 EDUCATION NEEDS:   Not appropriate for education at this time  Skin:  Skin Assessment: Reviewed RN Assessment  Last BM:  5/30  Height:   Ht Readings from Last 1 Encounters:  11/01/20 6\' 1"  (1.854 m)    Weight:   Wt Readings from Last 1 Encounters:  11/02/20 78 kg    Ideal Body Weight:  83.6 kg  BMI:  Body mass index is 22.68  kg/m.  Estimated Nutritional Needs:   Kcal:  1007-1219  Protein:  110-125 gm  Fluid:  >/= 2.2 L    Lucas Mallow, RD, LDN, CNSC Please refer to Amion for contact information.

## 2020-11-02 NOTE — Progress Notes (Signed)
Patient ID: Daniel Mccarty, male   DOB: 1966-05-24, 55 y.o.   MRN: [id number] [facility] Progress Note   Assessment/ Plan:   1. 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  His labs have not improved at all overnight with alleviation of obstruction and I will order for placement of a tunneled hemodialysis catheter and start hemodialysis today.  Unclear if he will have significant recovery to be able to come off dialysis. 2.  [place]ap metabolic acidosis: On sodium bicarbonate drip, discontinue this after starting dialysis to limit volume overload.  Likely exacerbated by obstruction and distal RTA. 3.  Hypokalemia: Secondary to transcellular shifts and also a bicarbonate drip/management of acidemia.  Will give oral potassium supplement. 4.  Urinary obstruction: This is likely chronic and related to benign prostatic hyperplasia, finasteride and tamsulosin with catheter now in place. 5.  Urinary tract infection: On intravenous ceftriaxone and without markers of sepsis. 6.  Hypocalcemia/hyperphosphatemia: Status post intravenous calcium yesterday and likely an indicator of chronic kidney disease with secondary hyperparathyroidism.  We will begin calcium acetate 3 times daily AC. 7.  Anemia: Suspect anemia of chronic kidney disease, status post PRBC transfusion and will start Aranesp today.  Subjective:   Is feeling a little better but overall still feels poor and not back to baseline.   Objective:   BP (!) 143/91 (BP Location: Right Arm)   Pulse 92   Temp 97.6 F (36.4 C) (Oral)   Resp 18   Ht 6\' 1"  (1.854 m)   Wt 78 kg   SpO2 100%   BMI 22.68 kg/m   Intake/Output Summary (Last 24 hours) at 11/02/2020 0956 Last data filed at 11/02/2020 0309 Gross per 24 hour  Intake 738.25 ml  Output 1400 ml  Net -661.75 ml   Weight change:   Physical  Exam: Gen: Comfortably resting in bed CVS: Pulse regular rhythm, normal rate, S1 and S2 with ejection systolic murmur Resp: Anteriorly clear to auscultation, no rales/rhonchi Abd: Soft, flat, nontender, bowel sounds normal Ext: No lower extremity edema  Imaging: US RENAL  Result Date: 11/01/2020 CLINICAL DATA:  Acute kidney injury. EXAM: RENAL / URINARY TRACT ULTRASOUND COMPLETE COMPARISON:  Abdominopelvic CT 08/14/2018 FINDINGS: Right Kidney: Renal measurements: 11.4 x 6.1 x 5.3 cm = volume: 194 mL. Marked hydronephrosis. Mild thinning of the renal parenchyma with increased parenchymal echogenicity. No evidence of stones or focal lesion. Left Kidney: Renal measurements: 9.3 x 5.2 x 4.2 cm = volume: 107 mL. Marked hydronephrosis. Moderate thinning of the renal parenchyma with mild increased echogenicity. No evidence of stones or focal lesion. Bladder: Distended with prevoid bladder volume of 708 cc. Internal debris/low-level echoes. Patient had difficulty voiding, and postvoid bladder volume actually increased, 773 cc. There is mild diffuse bladder wall thickening. Other: None. IMPRESSION: 1. Marked bilateral hydronephrosis with renal cortical thinning and increased parenchymal echogenicity, suggesting this is chronic. The degree of hydronephrosis has progressed from March 2020 abdominal CT. 2. Distended urinary bladder. Postvoid bladder volume actually increased as patient was unable to void, with postvoid bladder volume of 773 cc. 3. Internal bladder debris which may be due to stasis or infection. Mild bladder wall thickening. Electronically Signed   By: Keith Rake M.D.   On: 11/01/2020 20:08   DG Chest Portable 1 View  Result Date: 11/01/2020 CLINICAL DATA:  Acute kidney injury, hypertension EXAM: PORTABLE CHEST 1 VIEW COMPARISON:  None.  FINDINGS: The heart size and mediastinal contours are within normal limits. Both lungs are clear. The visualized skeletal structures are unremarkable.  IMPRESSION: Negative. Electronically Signed   By: Rolm Baptise M.D.   On: 11/01/2020 18:20    Labs: BMET Recent Labs  Lab 11/01/20 1523 11/01/20 1940 11/02/20 0618  NA 130*  --  133*  K 4.7  --  3.3*  CL 96*  --  95*  CO2 <7*  --  9*  GLUCOSE 132*  --  135*  BUN 239*  --  231*  CREATININE 33.39* 32.20* 33.43*  CALCIUM 6.6*  --  6.3*  PHOS  --   --  >30.0*   CBC Recent Labs  Lab 11/01/20 1523 11/01/20 1940 11/02/20 0618  WBC 8.4 8.3 8.5  NEUTROABS 7.5  --   --   HGB 5.8* 5.5* 7.2*  HCT 18.2* 17.3* 20.6*  MCV 84.3 84.8 79.8*  PLT 207 200 172    Medications:    . amLODipine  5 mg Oral Daily  . Chlorhexidine Gluconate Cloth  6 each Topical Daily  . finasteride  5 mg Oral Daily  . heparin  5,000 Units Subcutaneous Q8H  . potassium chloride  20 mEq Oral Once  . senna-docusate  1 tablet Oral QHS  . tamsulosin  0.4 mg Oral QPC supper      Elmarie Shiley, MD 11/02/2020, 9:56 AM

## 2020-11-02 NOTE — Evaluation (Signed)
Physical Therapy Evaluation Patient Details Name: Daniel Mccarty MRN: [id number] DOB: 10-12-1965 Today's Date: 11/02/2020   History of Present Illness  Patient is a 55 y/o male who presents on 11/01/20 from PCP due to elevated BP. Admitted with Hypertensive urgency, AKI on CKD IV, metabolic acidosis and severe anemia. Plan for tunneled HD catheter placement and initation of dialysis 11/02/20. PMH includes HTN, BPH, urinary retention.  Clinical Impression  Patient presents with mild balance deficits, decreased activity tolerance, cognitive deficits and impaired mobility s/p above. 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 Today, pt tolerated transfers and gait training with Min guard assist for safety due to slight unsteadiness. BP elevated post walk but pt asymptomatic. Not the best historian or reporter of symptoms but states that he is not close to baseline as he was running 2 weeks ago. Will need to further assess cognition after dialysis session. Encouraged increasing activity while in the hospital. Will follow acutely to maximize independence and mobility prior to return home.    Follow Up Recommendations No PT follow up;Supervision - Intermittent    Equipment Recommendations  None recommended by PT    Recommendations for Other Services       Precautions / Restrictions Precautions Precautions: Fall;Other (comment) Precaution Comments: watch BP Restrictions Weight Bearing Restrictions: No      Mobility  Bed Mobility Overal bed mobility: Needs Assistance Bed Mobility: Sit to Supine       Sit to supine: Modified independent (Device/Increase time)   General bed mobility comments: HOB flat, no assist needed.    Transfers Overall transfer level: Needs assistance Equipment used: None Transfers: Sit to/from Stand Sit to Stand: Min guard         General transfer comment: Min guard for safety. Stood from Automotive engineer.  Transferred back to bed due to needing to go down for procedure.  Ambulation/Gait Ambulation/Gait assistance: Min guard Gait Distance (Feet): 300 Feet Assistive device: None Gait Pattern/deviations: Step-through pattern;Decreased stride length;Drifts right/left Gait velocity: decreased   General Gait Details: Slow, mildly unsteady gait with IR of LLE and some drifting. Reports feeling ~50% at baseline.  Stairs            Wheelchair Mobility    Modified Rankin (Stroke Patients Only)       Balance Overall balance assessment: Needs assistance Sitting-balance support: Feet supported;No upper extremity supported Sitting balance-Leahy Scale: Good     Standing balance support: During functional activity   Standing balance comment: min guard for safety.                             Pertinent Vitals/Pain Pain Assessment: Faces Faces Pain Scale: Hurts a little bit Pain Location: penis from catheter Pain Descriptors / Indicators: Sore Pain Intervention(s): Monitored during session    Home Living Family/patient expects to be discharged to:: Private residence Living Arrangements: Parent Available Help at Discharge: Family;Available 24 hours/day Type of Home: House Home Access: Level entry     Home Layout: One level Home Equipment: None      Prior Function Level of Independence: Independent         Comments: Drives for [employer]; independent for ADLs/IADLs/walking.     Hand Dominance   Dominant Hand: Right    Extremity/Trunk Assessment   Upper Extremity Assessment Upper Extremity Assessment: Defer to OT evaluation    Lower Extremity Assessment Lower Extremity Assessment: Generalized  weakness;RLE deficits/detail (but functional) RLE Deficits / Details: some slight numbness in right foot-premorbid due to hx of sciatica RLE Sensation: decreased light touch    Cervical / Trunk Assessment Cervical / Trunk Assessment: Normal  Communication    Communication: No difficulties  Cognition Arousal/Alertness: Awake/alert Behavior During Therapy: WFL for tasks assessed/performed Overall Cognitive Status: Impaired/Different from baseline Area of Impairment: Orientation;Memory;Problem solving                 Orientation Level: Disoriented to;Time   Memory: Decreased short-term memory       Problem Solving: Slow processing;Requires verbal cues General Comments: "I couldn't tell you that" when asked about the month/date. Difficult historian at times with some slower processing. hard time explaining symptoms      General Comments General comments (skin integrity, edema, etc.): BP 177/85 post walk. Reports feeling fatigued.    Exercises     Assessment/Plan    PT Assessment Patient needs continued PT services  PT Problem List Decreased strength;Decreased mobility;Decreased safety awareness;Pain;Decreased balance;Impaired sensation;Cardiopulmonary status limiting activity;Decreased cognition;Decreased activity tolerance       PT Treatment Interventions Therapeutic exercise;Gait training;Stair training;Functional mobility training;Therapeutic activities;Patient/family education;Cognitive remediation;Balance training    PT Goals (Current goals can be found in the Care Plan section)  Acute Rehab PT Goals Patient Stated Goal: to get better PT Goal Formulation: With patient Time For Goal Achievement: 11/16/20 Potential to Achieve Goals: Good    Frequency Min 3X/week   Barriers to discharge        Co-evaluation               AM-PAC PT "6 Clicks" Mobility  Outcome Measure Help needed turning from your back to your side while in a flat bed without using bedrails?: None Help needed moving from lying on your back to sitting on the side of a flat bed without using bedrails?: None Help needed moving to and from a bed to a chair (including a wheelchair)?: A Little Help needed standing up from a chair using your arms  (e.g., wheelchair or bedside chair)?: A Little Help needed to walk in hospital room?: A Little Help needed climbing 3-5 steps with a railing? : A Little 6 Click Score: 20    End of Session Equipment Utilized During Treatment: Gait belt Activity Tolerance: Patient tolerated treatment well Patient left: in bed;with call bell/phone within reach Nurse Communication: Mobility status PT Visit Diagnosis: Unsteadiness on feet (R26.81);Difficulty in walking, not elsewhere classified (R26.2);Pain Pain - part of body:  (catheter insertion site)    Time: 1047-1105 PT Time Calculation (min) (ACUTE ONLY): 18 min   Charges:   PT Evaluation $PT Eval Moderate Complexity: 1 Mod          Marisa Severin, PT, DPT Acute Rehabilitation Services Pager [phone] Office [place] 11/02/2020, 12:44 PM

## 2020-11-03 LAB — BPAM RBC
Blood Product Expiration Date: 202206092359
Blood Product Expiration Date: 202207012359
ISSUE DATE / TIME: 202206022154
ISSUE DATE / TIME: 202206030007
Unit Type and Rh: 5100
Unit Type and Rh: 5100

## 2020-11-03 LAB — CBC
HCT: 19.7 % — ABNORMAL LOW (ref 39.0–52.0)
HCT: 19.9 % — ABNORMAL LOW (ref 39.0–52.0)
Hemoglobin: 7.1 g/dL — ABNORMAL LOW (ref 13.0–17.0)
Hemoglobin: 7 g/dL — ABNORMAL LOW (ref 13.0–17.0)
MCH: 27.8 pg (ref 26.0–34.0)
MCH: 28 pg (ref 26.0–34.0)
MCHC: 36 g/dL (ref 30.0–36.0)
MCHC: 35.2 g/dL (ref 30.0–36.0)
MCV: 77.3 fL — ABNORMAL LOW (ref 80.0–100.0)
MCV: 79.6 fL — ABNORMAL LOW (ref 80.0–100.0)
Platelets: 161 10*3/uL (ref 150–400)
Platelets: 155 10*3/uL (ref 150–400)
RBC: 2.55 MIL/uL — ABNORMAL LOW (ref 4.22–5.81)
RBC: 2.5 MIL/uL — ABNORMAL LOW (ref 4.22–5.81)
RDW: 13 % (ref 11.5–15.5)
RDW: 13.2 % (ref 11.5–15.5)
WBC: 7.8 10*3/uL (ref 4.0–10.5)
WBC: 8.3 10*3/uL (ref 4.0–10.5)
nRBC: 0 % (ref 0.0–0.2)
nRBC: 0 % (ref 0.0–0.2)

## 2020-11-03 LAB — RENAL FUNCTION PANEL
Albumin: 3.3 g/dL — ABNORMAL LOW (ref 3.5–5.0)
Albumin: 3.3 g/dL — ABNORMAL LOW (ref 3.5–5.0)
Anion gap: 20 — ABNORMAL HIGH (ref 5–15)
Anion gap: 19 — ABNORMAL HIGH (ref 5–15)
BUN: 145 mg/dL — ABNORMAL HIGH (ref 6–20)
BUN: 137 mg/dL — ABNORMAL HIGH (ref 6–20)
CO2: 23 mmol/L (ref 22–32)
CO2: 26 mmol/L (ref 22–32)
Calcium: 6 mg/dL — CL (ref 8.9–10.3)
Calcium: 5.8 mg/dL — CL (ref 8.9–10.3)
Chloride: 91 mmol/L — ABNORMAL LOW (ref 98–111)
Chloride: 91 mmol/L — ABNORMAL LOW (ref 98–111)
Creatinine, Ser: 19.53 mg/dL — ABNORMAL HIGH (ref 0.61–1.24)
Creatinine, Ser: 19.54 mg/dL — ABNORMAL HIGH (ref 0.61–1.24)
GFR, Estimated: 3 mL/min — ABNORMAL LOW (ref >60)
GFR, Estimated: 3 mL/min — ABNORMAL LOW (ref >60)
Glucose, Bld: 153 mg/dL — ABNORMAL HIGH (ref 70–99)
Glucose, Bld: 121 mg/dL — ABNORMAL HIGH (ref 70–99)
Phosphorus: 6.2 mg/dL — ABNORMAL HIGH (ref 2.5–4.6)
Phosphorus: 7.1 mg/dL — ABNORMAL HIGH (ref 2.5–4.6)
Potassium: 2.7 mmol/L — CL (ref 3.5–5.1)
Potassium: 3.3 mmol/L — ABNORMAL LOW (ref 3.5–5.1)
Sodium: 134 mmol/L — ABNORMAL LOW (ref 135–145)
Sodium: 136 mmol/L (ref 135–145)

## 2020-11-03 LAB — URINE CULTURE: Culture: NO GROWTH

## 2020-11-03 LAB — TYPE AND SCREEN
ABO/RH(D): O POS
Antibody Screen: NEGATIVE
Unit division: 0
Unit division: 0

## 2020-11-03 LAB — HEPATITIS B SURFACE ANTIBODY, QUANTITATIVE: Hep B S AB Quant (Post): <3.1 m[IU]/mL — ABNORMAL LOW (ref >9.9)

## 2020-11-03 LAB — PARATHYROID HORMONE, INTACT (NO CA): PTH: 406 pg/mL — ABNORMAL HIGH (ref 15–65)

## 2020-11-03 MED ORDER — HEPARIN SODIUM (PORCINE) 1000 UNIT/ML IJ SOLN
3800.0000 [IU] | Freq: Once | INTRAMUSCULAR | Status: AC
  Administered 2020-11-03: 3800 [IU]

## 2020-11-03 MED ORDER — AMLODIPINE BESYLATE 10 MG PO TABS: 10.0000 mg | ORAL_TABLET | Freq: Every day | ORAL | Status: DC

## 2020-11-03 MED ORDER — AMLODIPINE BESYLATE 5 MG PO TABS
5.0000 mg | ORAL_TABLET | Freq: Every day | ORAL | Status: DC
  Administered 2020-11-04 – 2020-11-13 (×9): 5 mg via ORAL
  Filled 2020-11-03 (×10): qty 1

## 2020-11-03 MED ORDER — POTASSIUM CHLORIDE CRYS ER 20 MEQ PO TBCR: 20.0000 meq | EXTENDED_RELEASE_TABLET | Freq: Once | ORAL | Status: DC

## 2020-11-03 MED ORDER — CALCIUM ACETATE (PHOS BINDER) 667 MG/5ML PO SOLN
1334.0000 mg | Freq: Three times a day (TID) | ORAL | Status: DC
  Administered 2020-11-03 – 2020-11-08 (×13): 1334 mg via ORAL
  Filled 2020-11-03 (×18): qty 10

## 2020-11-03 MED ORDER — POTASSIUM CHLORIDE 20 MEQ PO PACK
40.0000 meq | PACK | Freq: Once | ORAL | Status: AC
  Administered 2020-11-03: 40 meq via ORAL
  Filled 2020-11-03: qty 2

## 2020-11-03 MED ORDER — COVID-19 MRNA VACC (MODERNA) 100 MCG/0.5ML IM SUSP
0.5000 mL | Freq: Once | INTRAMUSCULAR | Status: AC
  Filled 2020-11-03 (×2): qty 0.5

## 2020-11-03 MED ORDER — HEPARIN SODIUM (PORCINE) 1000 UNIT/ML IJ SOLN
INTRAMUSCULAR | Status: AC
  Filled 2020-11-03: qty 4

## 2020-11-03 MED ORDER — POTASSIUM CHLORIDE CRYS ER 20 MEQ PO TBCR: 40.0000 meq | EXTENDED_RELEASE_TABLET | Freq: Once | ORAL | Status: DC

## 2020-11-03 MED ORDER — POTASSIUM CHLORIDE 20 MEQ PO PACK
20.0000 meq | PACK | Freq: Once | ORAL | Status: AC
  Administered 2020-11-03: 20 meq via ORAL
  Filled 2020-11-03: qty 1

## 2020-11-03 NOTE — Progress Notes (Signed)
Patient ID: Daniel Mccarty, male   DOB: January 13, 1966, 55 y.o.   MRN: [id number] [facility] Progress Note   Assessment/ Plan:   1. 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  I am impressed by his significant improvement of urine output overnight with labs looking better following dialysis yesterday for severe azotemia.  I will order for additional hemodialysis today and then monitor for potential renal recovery over the next few days (high index of suspicion that he has significant underlying chronic kidney disease but unclear if this would mean chronic dialysis dependency at this point). 2.  Nongap metabolic acidosis: Improved with hemodialysis/sodium bicarbonate drip-we will discontinue the latter. 3.  Hypokalemia: Secondary to transcellular shifts and also a bicarbonate drip/management of acidemia.  Will give additional potassium supplement and adjust dialysate. 4.  Urinary obstruction: This is likely chronic and related to benign prostatic hyperplasia, finasteride and tamsulosin with catheter now in place. 5.  Urinary tract infection: On intravenous ceftriaxone and without markers of sepsis. 6.  Hypocalcemia/hyperphosphatemia: Status post intravenous calcium yesterday and likely an indicator of chronic kidney disease with secondary hyperparathyroidism.  On calcium acetate 3 times daily AC. 7.  Anemia: Suspect anemia of chronic kidney disease, status post PRBC transfusion and started on Aranesp.  Subjective:   He reports to be feeling tired from difficulty sleeping overnight-ambient noises.   Objective:   BP (!) 150/93   Pulse 82   Temp 98.6 F (37 C) (Oral)   Resp 15   Ht 6\' 1"  (1.854 m)   Wt 78.7 kg   SpO2 98%   BMI 22.89 kg/m   Intake/Output Summary (Last 24 hours) at 11/03/2020 1019 Last data filed at 11/03/2020 0415 Gross per 24  hour  Intake 100 ml  Output 3550 ml  Net -3450 ml   Weight change: -1.133 kg  Physical Exam: Gen: Comfortably resting in bed CVS: Pulse regular rhythm, normal rate, S1 and S2 with ejection systolic murmur Resp: Anteriorly clear to auscultation, no rales/rhonchi Abd: Soft, flat, nontender, bowel sounds normal Ext: No lower extremity edema  Imaging: US RENAL  Result Date: 11/01/2020 CLINICAL DATA:  Acute kidney injury. EXAM: RENAL / URINARY TRACT ULTRASOUND COMPLETE COMPARISON:  Abdominopelvic CT 08/14/2018 FINDINGS: Right Kidney: Renal measurements: 11.4 x 6.1 x 5.3 cm = volume: 194 mL. Marked hydronephrosis. Mild thinning of the renal parenchyma with increased parenchymal echogenicity. No evidence of stones or focal lesion. Left Kidney: Renal measurements: 9.3 x 5.2 x 4.2 cm = volume: 107 mL. Marked hydronephrosis. Moderate thinning of the renal parenchyma with mild increased echogenicity. No evidence of stones or focal lesion. Bladder: Distended with prevoid bladder volume of 708 cc. Internal debris/low-level echoes. Patient had difficulty voiding, and postvoid bladder volume actually increased, 773 cc. There is mild diffuse bladder wall thickening. Other: None. IMPRESSION: 1. Marked bilateral hydronephrosis with renal cortical thinning and increased parenchymal echogenicity, suggesting this is chronic. The degree of hydronephrosis has progressed from March 2020 abdominal CT. 2. Distended urinary bladder. Postvoid bladder volume actually increased as patient was unable to void, with postvoid bladder volume of 773 cc. 3. Internal bladder debris which may be due to stasis or infection. Mild bladder wall thickening. Electronically Signed   By: Keith Rake M.D.   On: 11/01/2020 20:08   IR Fluoro Guide CV Line Right  Result Date: 11/02/2020 CLINICAL DATA:  Progressive renal insufficiency, needs access for hemodialysis  EXAM: TUNNELED HEMODIALYSIS CATHETER PLACEMENT WITH ULTRASOUND AND FLUOROSCOPIC  GUIDANCE TECHNIQUE: The procedure, risks, benefits, and alternatives were explained to the patient. Questions regarding the procedure were encouraged and answered. The patient understands and consents to the procedure. As antibiotic prophylaxis, cefazolin 2 g was ordered pre-procedure and administered intravenously within one hour of incision.Patency of the right IJ vein was confirmed with ultrasound with image documentation. An appropriate skin site was determined. Region was prepped using maximum barrier technique including cap and mask, sterile gown, sterile gloves, large sterile sheet, and Chlorhexidine as cutaneous antisepsis. The region was infiltrated locally with 1% lidocaine. Intravenous Fentanyl 11mcg and Versed 1mg  were administered as conscious sedation during continuous monitoring of the patient's level of consciousness and physiological / cardiorespiratory status by the radiology RN, with a total moderate sedation time of 15 minutes. Under real-time ultrasound guidance, the right IJ vein was accessed with a 21 gauge micropuncture needle; the needle tip within the vein was confirmed with ultrasound image documentation. Needle exchanged over the 018 guidewire for transitional dilator, which allowed advancement of a Benson wire into the IVC. Over this, an MPA catheter was advanced. A Palindrome 23 hemodialysis catheter was tunneled from the right anterior chest wall approach to the right IJ dermatotomy site. The MPA catheter was exchanged over an Amplatz wire for serial vascular dilators which allow placement of a peel-away sheath, through which the catheter was advanced under intermittent fluoroscopy, positioned with its tips in the proximal and midright atrium. Spot chest radiograph confirms good catheter position. No pneumothorax. Catheter was flushed and primed per protocol. Catheter secured externally with O Prolene sutures. The right IJ dermatotomy site was closed with Dermabond. COMPLICATIONS:  COMPLICATIONS None immediate FLUOROSCOPY TIME:  48 seconds; 2 mGy COMPARISON:  None IMPRESSION: 1. Technically successful placement of tunneled right IJ hemodialysis catheter with ultrasound and fluoroscopic guidance. Ready for routine use. Electronically Signed   By: Lucrezia Europe M.D.   On: 11/02/2020 13:41   IR US Guide Vasc Access Right  Result Date: 11/02/2020 CLINICAL DATA:  Progressive renal insufficiency, needs access for hemodialysis EXAM: TUNNELED HEMODIALYSIS CATHETER PLACEMENT WITH ULTRASOUND AND FLUOROSCOPIC GUIDANCE TECHNIQUE: The procedure, risks, benefits, and alternatives were explained to the patient. Questions regarding the procedure were encouraged and answered. The patient understands and consents to the procedure. As antibiotic prophylaxis, cefazolin 2 g was ordered pre-procedure and administered intravenously within one hour of incision.Patency of the right IJ vein was confirmed with ultrasound with image documentation. An appropriate skin site was determined. Region was prepped using maximum barrier technique including cap and mask, sterile gown, sterile gloves, large sterile sheet, and Chlorhexidine as cutaneous antisepsis. The region was infiltrated locally with 1% lidocaine. Intravenous Fentanyl 81mcg and Versed 1mg  were administered as conscious sedation during continuous monitoring of the patient's level of consciousness and physiological / cardiorespiratory status by the radiology RN, with a total moderate sedation time of 15 minutes. Under real-time ultrasound guidance, the right IJ vein was accessed with a 21 gauge micropuncture needle; the needle tip within the vein was confirmed with ultrasound image documentation. Needle exchanged over the 018 guidewire for transitional dilator, which allowed advancement of a Benson wire into the IVC. Over this, an MPA catheter was advanced. A Palindrome 23 hemodialysis catheter was tunneled from the right anterior chest wall approach to the right  IJ dermatotomy site. The MPA catheter was exchanged over an Amplatz wire for serial vascular dilators which allow placement of a peel-away sheath, through which the catheter  was advanced under intermittent fluoroscopy, positioned with its tips in the proximal and midright atrium. Spot chest radiograph confirms good catheter position. No pneumothorax. Catheter was flushed and primed per protocol. Catheter secured externally with O Prolene sutures. The right IJ dermatotomy site was closed with Dermabond. COMPLICATIONS: COMPLICATIONS None immediate FLUOROSCOPY TIME:  48 seconds; 2 mGy COMPARISON:  None IMPRESSION: 1. Technically successful placement of tunneled right IJ hemodialysis catheter with ultrasound and fluoroscopic guidance. Ready for routine use. Electronically Signed   By: Lucrezia Europe M.D.   On: 11/02/2020 13:41   DG Chest Portable 1 View  Result Date: 11/01/2020 CLINICAL DATA:  Acute kidney injury, hypertension EXAM: PORTABLE CHEST 1 VIEW COMPARISON:  None. FINDINGS: The heart size and mediastinal contours are within normal limits. Both lungs are clear. The visualized skeletal structures are unremarkable. IMPRESSION: Negative. Electronically Signed   By: Rolm Baptise M.D.   On: 11/01/2020 18:20    Labs: BMET Recent Labs  Lab 11/01/20 1523 11/01/20 1940 11/02/20 0618 11/03/20 0318  NA 130*  --  133* 134*  K 4.7  --  3.3* 2.7*  CL 96*  --  95* 91*  CO2 <7*  --  9* 23  GLUCOSE 132*  --  135* 153*  BUN 239*  --  231* 145*  CREATININE 33.39* 32.20* 33.43* 19.53*  CALCIUM 6.6*  --  6.3* 6.0*  PHOS  --   --  >30.0* 6.2*   CBC Recent Labs  Lab 11/01/20 1523 11/01/20 1940 11/02/20 0618 11/03/20 0318  WBC 8.4 8.3 8.5 7.8  NEUTROABS 7.5  --   --   --   HGB 5.8* 5.5* 7.2* 7.1*  HCT 18.2* 17.3* 20.6* 19.7*  MCV 84.3 84.8 79.8* 77.3*  PLT 207 200 172 161    Medications:    . amLODipine  5 mg Oral Daily  . calcium acetate  1,334 mg Oral TID WC  . Chlorhexidine Gluconate Cloth  6  each Topical Daily  . [START ON 11/04/2020] COVID-19 mRNA vaccine (Moderna)  0.5 mL Intramuscular ONCE-1600  . feeding supplement  1 Container Oral TID BM  . finasteride  5 mg Oral Daily  . heparin  5,000 Units Subcutaneous Q8H  . ondansetron  4 mg Oral Once  . senna-docusate  1 tablet Oral QHS  . tamsulosin  0.4 mg Oral QPC supper      Elmarie Shiley, MD 11/03/2020, 10:19 AM

## 2020-11-03 NOTE — Progress Notes (Signed)
Occupational Therapy Evaluation Patient Details Name: Daniel Mccarty MRN: [id number] DOB: [birth year] Today's Date: 11/03/2020    History of Present Illness Patient is a 55 y/o male who presents on 11/01/20 from PCP due to elevated BP. Admitted with Hypertensive urgency, AKI on CKD IV, metabolic acidosis and severe anemia. Plan for tunneled HD catheter placement and initation of dialysis 11/02/20. PMH includes HTN, BPH, urinary retention.   Clinical Impression   PT received awake and alert agreeable to initial OT eval. PTA pt lives at home with family, I with ADLs and IADLs, employed as driver for [employer]. Pt currently limited with safe ADL engaged due to limited cognition and weakness. During session and after completion of mini cog assessment pt c/o of nausea and observed to vomit with increased HR of 128. RN advised of condition and given meds to control vomitting. Pt is motivated to get stronger and participate in therapy to return home safely. Pt will benefit in further acute OT to address safety awareness, cognition, strength, and safety with ADLs as needed.     Follow Up Recommendations  No OT follow up    Equipment Recommendations       Recommendations for Other Services       Precautions / Restrictions Precautions Precautions: Fall;Other (comment) Precaution Comments: watch BP Restrictions Weight Bearing Restrictions: No      Mobility Bed Mobility Overal bed mobility: Needs Assistance Bed Mobility: Sit to Supine       Sit to supine: Modified independent (Device/Increase time)   General bed mobility comments: HOB flat, no assist needed.    Transfers Overall transfer level: Needs assistance Equipment used: None Transfers: Sit to/from Stand Sit to Stand: Min guard         General transfer comment: Min guard for safety.    Balance Overall balance assessment: Needs assistance Sitting-balance support: Feet supported;No upper extremity supported Sitting balance-Leahy  Scale: Good     Standing balance support: During functional activity   Standing balance comment: min guard for safety.                           ADL either performed or assessed with clinical judgement   ADL Overall ADL's : Needs assistance/impaired Eating/Feeding: Set up;Sitting   Grooming: Wash/dry hands;Wash/dry face;Oral care;Set up;Sitting   Upper Body Bathing: Modified independent;Sitting   Lower Body Bathing: Modified independent;Sitting/lateral leans   Upper Body Dressing : Modified independent;Sitting   Lower Body Dressing: Supervision/safety;Sit to/from stand   Toilet Transfer: Magazine features editor Details (indicate cue type and reason): simulated toilet transfer from bed<>bed. Toileting- Clothing Manipulation and Hygiene: Total assistance Toileting - Clothing Manipulation Details (indicate cue type and reason): foley catheter placed.     Functional mobility during ADLs: Min guard       Vision         Perception     Praxis      Pertinent Vitals/Pain Pain Assessment: Faces Faces Pain Scale: No hurt Pain Location: penis from catheter Pain Descriptors / Indicators: Sore Pain Intervention(s): Monitored during session;RN gave pain meds during session     Hand Dominance Right   Extremity/Trunk Assessment Upper Extremity Assessment Upper Extremity Assessment: Overall WFL for tasks assessed   Lower Extremity Assessment Lower Extremity Assessment: Defer to PT evaluation RLE Deficits / Details: some slight numbness in right foot-premorbid due to hx of sciatica RLE Sensation: decreased light touch   Cervical / Trunk Assessment Cervical / Trunk Assessment: Normal  Communication Communication Communication: No difficulties   Cognition Arousal/Alertness: Awake/alert Behavior During Therapy: WFL for tasks assessed/performed Overall Cognitive Status: Impaired/Different from baseline Area of Impairment: Orientation;Memory;Problem  solving                 Orientation Level: Disoriented to;Time   Memory: Decreased short-term memory       Problem Solving: Slow processing;Requires verbal cues General Comments: when completing mini cog assessment, pt demonstrated difficulty with drawing clock, mostly specifically number placement. Cues were required with increased time. Pt took more than 3 mins to complete task. Pt only able to restate 1/3 words.   General Comments  BP levels checked for orthostatic values 128/90 sitting, 107/69 sstanding,141/87 sitting    Exercises     Shoulder Instructions      Home Living Family/patient expects to be discharged to:: Private residence Living Arrangements: Parent Available Help at Discharge: Family;Available 24 hours/day Type of Home: House Home Access: Level entry     Home Layout: One level     Bathroom Shower/Tub: Teacher, early years/pre: Standard     Home Equipment: None          Prior Functioning/Environment Level of Independence: Independent        Comments: Drives for [employer]; independent for ADLs/IADLs/walking.        OT Problem List: Decreased activity tolerance;Decreased cognition;Decreased safety awareness      OT Treatment/Interventions: Self-care/ADL training;Therapeutic exercise;Therapeutic activities;Cognitive remediation/compensation;Patient/family education    OT Goals(Current goals can be found in the care plan section) Acute Rehab OT Goals Patient Stated Goal: to get better OT Goal Formulation: With patient Time For Goal Achievement: 11/17/20 Potential to Achieve Goals: Fair  OT Frequency: Min 2X/week   Barriers to D/C:            Co-evaluation              AM-PAC OT "6 Clicks" Daily Activity     Outcome Measure Help from another person eating meals?: None Help from another person taking care of personal grooming?: None Help from another person toileting, which includes using toliet, bedpan, or urinal?: A  Little Help from another person bathing (including washing, rinsing, drying)?: None Help from another person to put on and taking off regular upper body clothing?: None Help from another person to put on and taking off regular lower body clothing?: None 6 Click Score: 23   End of Session Equipment Utilized During Treatment: Gait belt  Activity Tolerance: Other (comment) (session limited due to c/o nausea and vomitting. RN advised, pt given meds during session. HR elevated to 128, able to resolve with rest.) Patient left: in bed;with call bell/phone within reach;with nursing/sitter in room  OT Visit Diagnosis: Muscle weakness (generalized) (M62.81);Dizziness and giddiness (R42)                Time: 1610-9604 OT Time Calculation (min): 33 min Charges:  OT General Charges $OT Visit: 1 Visit OT Evaluation $OT Eval Low Complexity: 1 Low OT Treatments $Self Care/Home Management : 8-22 mins  Daniel Mccarty, MSOT, OTR/L  [facility]  [phone]   Marius Ditch 11/03/2020, 10:21 AM

## 2020-11-03 NOTE — Progress Notes (Signed)
Date and time results received: 11/03/20 1645  (use smartphrase ".now" to insert current time)  Test: Calcium Critical Value: 5.8  Name of Provider Notified: Hosie Poisson MD  Orders Received? Or Actions Taken?: No order received. Pt currently in dialysis.

## 2020-11-03 NOTE — Progress Notes (Signed)
PROGRESS NOTE    Daniel Mccarty  ERD:[id number] DOB: Jan 15, 1966 DOA: 11/01/2020 PCP: Patient, No Pcp Per (Inactive)    Chief Complaint  Patient presents with  . Hypertension    Brief Narrative:  Daniel Mccarty is a 55 y.o. male with medical history significant for essential hypertension, CKD, urinary retention, BPH, who presented to [facility] ED at the recommendation of his PCP due to hypertensive urgency with SBP in the 200s.  Patient has not seen a provider for more than 2 years.  He ran out of his oral antihypertensive and has not taken any prescribed medications for the last 2 years.  He finally made an appointment to see his PCP today due to worsening fatigue and with the hope of having refills of his prior medications. While in the ED labs were drawn and resulted with significant abnormalities.  BUN 239, creatinine 33, serum bicarb less than 7, hemoglobin 5.8K.  EDP contacted nephrology who will see in consultation.  Patient was started on isotonic bicarb.  TRH, hospitalist team, was asked to admit.   Nephrology consulted.  IR consulted for HD catheter placement Assessment & Plan:   Active Problems:   AKI (acute kidney injury) ([place])   Acute kidney injury with nongap metabolic acidosis in the setting of CKD from uncontrolled hypertension. Tunneled HD catheter placed  by IR, underwent HD on 11/02/20 and plan for another session today,.  Improvement in the creatinine from 33 to 19.  Avoid nephrotoxins and continue to monitor renal parameters. Bicarb wnl. D/c sodium bicarb gtt.    Urinary obstruction Probably chronic secondary to BPH Foley catheter in place and continue with finasteride and and Flomax. Negative 5 lit since admission.     Urinary tract infection Urine cultures, negative, d/c rocephin.    Anemia of chronic disease probably secondary to CKD S/p PRBC transfusion and nephrology plan to start Aranesp by nephrology.  Transfuse to keep hemoglobin greater than  7.   Hypokalemia Replaced.   Hypertension:  Better controlled.  Continue with norvasc 5 mg daily.    DVT prophylaxis: Heparin Code Status: Full code Family Communication: None at bedside Disposition:   Status is: Inpatient  Remains inpatient appropriate because:Ongoing diagnostic testing needed not appropriate for outpatient work up and IV treatments appropriate due to intensity of illness or inability to take PO   Dispo:  Patient From: Home  Planned Disposition: Home  Medically stable for discharge: No         Consultants:   IR  NEPHROLOGY.    Procedures: none.   Antimicrobials:  Antibiotics Given (last 72 hours)    Date/Time Action Medication Dose Rate   11/01/20 2132 New Bag/Given   cefTRIAXone (ROCEPHIN) 1 g in sodium chloride 0.9 % 100 mL IVPB 1 g 200 mL/hr   11/02/20 1204 New Bag/Given   ceFAZolin (ANCEF) 2-4 GM/100ML-% IVPB 2,000 mg 200 mL/hr   11/02/20 2125 New Bag/Given   cefTRIAXone (ROCEPHIN) 1 g in sodium chloride 0.9 % 100 mL IVPB 1 g 200 mL/hr         Subjective: No chest pain or sob.   Objective: Vitals:   11/02/20 2055 11/02/20 2300 11/03/20 0257 11/03/20 0700  BP: (!) 147/88 (!) 143/91 135/90 (!) 150/93  Pulse: 87 89 78 82  Resp: 18 16 15    Temp: 98.7 F (37.1 C)  98.6 F (37 C)   TempSrc: Oral  Oral   SpO2: 100% 99% 97% 98%  Weight:      Height:  Intake/Output Summary (Last 24 hours) at 11/03/2020 1435 Last data filed at 11/03/2020 1100 Gross per 24 hour  Intake --  Output 2101 ml  Net -2101 ml   Filed Weights   11/01/20 2331 11/02/20 0500 11/02/20 1500  Weight: 78 kg 78 kg 78.7 kg    Examination:  General exam:alert and comfortable.  Respiratory system: air entry fair, no wheezing or rhonchi.  Cardiovascular system: SS1S2, RRR, no JVD, no pedal edema.  Gastrointestinal system: Abdomen is soft, NT ND BS+ Central nervous system: Alert and oriented, non focal Extremities: no pedal edema.  Skin: No rashes  seen Psychiatry: Mood is appropriate.    Data Reviewed: I have personally reviewed following labs and imaging studies  CBC: Recent Labs  Lab 11/01/20 1523 11/01/20 1940 11/02/20 0618 11/03/20 0318  WBC 8.4 8.3 8.5 7.8  NEUTROABS 7.5  --   --   --   HGB 5.8* 5.5* 7.2* 7.1*  HCT 18.2* 17.3* 20.6* 19.7*  MCV 84.3 84.8 79.8* 77.3*  PLT 207 200 172 161    Basic Metabolic Panel: Recent Labs  Lab 11/01/20 1523 11/01/20 1940 11/02/20 0618 11/03/20 0318  NA 130*  --  133* 134*  K 4.7  --  3.3* 2.7*  CL 96*  --  95* 91*  CO2 <7*  --  9* 23  GLUCOSE 132*  --  135* 153*  BUN 239*  --  231* 145*  CREATININE 33.39* 32.20* 33.43* 19.53*  CALCIUM 6.6*  --  6.3* 6.0*  MG  --   --  1.6*  --   PHOS  --   --  >30.0* 6.2*    GFR: Estimated Creatinine Clearance: 4.8 mL/min (A) (by C-G formula based on SCr of 19.53 mg/dL (H)).  Liver Function Tests: Recent Labs  Lab 11/01/20 1807 11/03/20 0318  AST 11*  --   ALT 11  --   ALKPHOS 50  --   BILITOT 0.7  --   PROT 8.0  --   ALBUMIN 4.2 3.3*    CBG: No results for input(s): GLUCAP in the last 168 hours.   Recent Results (from the past 240 hour(s))  Culture, Urine     Status: None   Collection Time: 11/01/20  8:05 PM   Specimen: Urine, Random  Result Value Ref Range Status   Specimen Description URINE, RANDOM  Final   Special Requests NONE  Final   Culture   Final    NO GROWTH Performed at [facility], [address] [place], [place] [address]    Report Status 11/03/2020 FINAL  Final  Resp Panel by RT-PCR (Flu A&B, Covid) Nasopharyngeal Swab     Status: None   Collection Time: 11/01/20  8:24 PM   Specimen: Nasopharyngeal Swab; Nasopharyngeal(NP) swabs in vial transport medium  Result Value Ref Range Status   SARS Coronavirus 2 by RT PCR NEGATIVE NEGATIVE Final    Comment: (NOTE) SARS-CoV-2 target nucleic acids are NOT DETECTED.  The SARS-CoV-2 RNA is generally detectable in upper respiratory specimens  during the acute phase of infection. The lowest concentration of SARS-CoV-2 viral copies this assay can detect is 138 copies/mL. A negative result does not preclude SARS-Cov-2 infection and should not be used as the sole basis for treatment or other patient management decisions. A negative result may occur with  improper specimen collection/handling, submission of specimen other than nasopharyngeal swab, presence of viral mutation(s) within the areas targeted by this assay, and inadequate number of viral copies(<138  copies/mL). A negative result must be combined with clinical observations, patient history, and epidemiological information. The expected result is Negative.  Fact Sheet for Patients:  EntrepreneurPulse.com.au  Fact Sheet for Healthcare Providers:  IncredibleEmployment.be  This test is no t yet approved or cleared by the [place] FDA and  has been authorized for detection and/or diagnosis of SARS-CoV-2 by FDA under an Emergency Use Authorization (EUA). This EUA will remain  in effect (meaning this test can be used) for the duration of the COVID-19 declaration under Section 564(b)(1) of the Act, 21 U.S.C.section 360bbb-3(b)(1), unless the authorization is terminated  or revoked sooner.       Influenza A by PCR NEGATIVE NEGATIVE Final   Influenza B by PCR NEGATIVE NEGATIVE Final    Comment: (NOTE) The Xpert Xpress SARS-CoV-2/FLU/RSV plus assay is intended as an aid in the diagnosis of influenza from Nasopharyngeal swab specimens and should not be used as a sole basis for treatment. Nasal washings and aspirates are unacceptable for Xpert Xpress SARS-CoV-2/FLU/RSV testing.  Fact Sheet for Patients: EntrepreneurPulse.com.au  Fact Sheet for Healthcare Providers: IncredibleEmployment.be  This test is not yet approved or cleared by the [place] FDA and has been authorized for detection  and/or diagnosis of SARS-CoV-2 by FDA under an Emergency Use Authorization (EUA). This EUA will remain in effect (meaning this test can be used) for the duration of the COVID-19 declaration under Section 564(b)(1) of the Act, 21 U.S.C. section 360bbb-3(b)(1), unless the authorization is terminated or revoked.  Performed at [facility], [place] [address], [place], [place] [address]          Radiology Studies: US RENAL  Result Date: 11/01/2020 CLINICAL DATA:  Acute kidney injury. EXAM: RENAL / URINARY TRACT ULTRASOUND COMPLETE COMPARISON:  Abdominopelvic CT 08/14/2018 FINDINGS: Right Kidney: Renal measurements: 11.4 x 6.1 x 5.3 cm = volume: 194 mL. Marked hydronephrosis. Mild thinning of the renal parenchyma with increased parenchymal echogenicity. No evidence of stones or focal lesion. Left Kidney: Renal measurements: 9.3 x 5.2 x 4.2 cm = volume: 107 mL. Marked hydronephrosis. Moderate thinning of the renal parenchyma with mild increased echogenicity. No evidence of stones or focal lesion. Bladder: Distended with prevoid bladder volume of 708 cc. Internal debris/low-level echoes. Patient had difficulty voiding, and postvoid bladder volume actually increased, 773 cc. There is mild diffuse bladder wall thickening. Other: None. IMPRESSION: 1. Marked bilateral hydronephrosis with renal cortical thinning and increased parenchymal echogenicity, suggesting this is chronic. The degree of hydronephrosis has progressed from March 2020 abdominal CT. 2. Distended urinary bladder. Postvoid bladder volume actually increased as patient was unable to void, with postvoid bladder volume of 773 cc. 3. Internal bladder debris which may be due to stasis or infection. Mild bladder wall thickening. Electronically Signed   By: Keith Rake M.D.   On: 11/01/2020 20:08   IR Fluoro Guide CV Line Right  Result Date: 11/02/2020 CLINICAL DATA:  Progressive renal insufficiency, needs access for hemodialysis EXAM:  TUNNELED HEMODIALYSIS CATHETER PLACEMENT WITH ULTRASOUND AND FLUOROSCOPIC GUIDANCE TECHNIQUE: The procedure, risks, benefits, and alternatives were explained to the patient. Questions regarding the procedure were encouraged and answered. The patient understands and consents to the procedure. As antibiotic prophylaxis, cefazolin 2 g was ordered pre-procedure and administered intravenously within one hour of incision.Patency of the right IJ vein was confirmed with ultrasound with image documentation. An appropriate skin site was determined. Region was prepped using maximum barrier technique including cap and mask, sterile gown, sterile gloves, large sterile  sheet, and Chlorhexidine as cutaneous antisepsis. The region was infiltrated locally with 1% lidocaine. Intravenous Fentanyl 21mcg and Versed 1mg  were administered as conscious sedation during continuous monitoring of the patient's level of consciousness and physiological / cardiorespiratory status by the radiology RN, with a total moderate sedation time of 15 minutes. Under real-time ultrasound guidance, the right IJ vein was accessed with a 21 gauge micropuncture needle; the needle tip within the vein was confirmed with ultrasound image documentation. Needle exchanged over the 018 guidewire for transitional dilator, which allowed advancement of a Benson wire into the IVC. Over this, an MPA catheter was advanced. A Palindrome 23 hemodialysis catheter was tunneled from the right anterior chest wall approach to the right IJ dermatotomy site. The MPA catheter was exchanged over an Amplatz wire for serial vascular dilators which allow placement of a peel-away sheath, through which the catheter was advanced under intermittent fluoroscopy, positioned with its tips in the proximal and midright atrium. Spot chest radiograph confirms good catheter position. No pneumothorax. Catheter was flushed and primed per protocol. Catheter secured externally with O Prolene sutures.  The right IJ dermatotomy site was closed with Dermabond. COMPLICATIONS: COMPLICATIONS None immediate FLUOROSCOPY TIME:  48 seconds; 2 mGy COMPARISON:  None IMPRESSION: 1. Technically successful placement of tunneled right IJ hemodialysis catheter with ultrasound and fluoroscopic guidance. Ready for routine use. Electronically Signed   By: Lucrezia Europe M.D.   On: 11/02/2020 13:41   IR US Guide Vasc Access Right  Result Date: 11/02/2020 CLINICAL DATA:  Progressive renal insufficiency, needs access for hemodialysis EXAM: TUNNELED HEMODIALYSIS CATHETER PLACEMENT WITH ULTRASOUND AND FLUOROSCOPIC GUIDANCE TECHNIQUE: The procedure, risks, benefits, and alternatives were explained to the patient. Questions regarding the procedure were encouraged and answered. The patient understands and consents to the procedure. As antibiotic prophylaxis, cefazolin 2 g was ordered pre-procedure and administered intravenously within one hour of incision.Patency of the right IJ vein was confirmed with ultrasound with image documentation. An appropriate skin site was determined. Region was prepped using maximum barrier technique including cap and mask, sterile gown, sterile gloves, large sterile sheet, and Chlorhexidine as cutaneous antisepsis. The region was infiltrated locally with 1% lidocaine. Intravenous Fentanyl 61mcg and Versed 1mg  were administered as conscious sedation during continuous monitoring of the patient's level of consciousness and physiological / cardiorespiratory status by the radiology RN, with a total moderate sedation time of 15 minutes. Under real-time ultrasound guidance, the right IJ vein was accessed with a 21 gauge micropuncture needle; the needle tip within the vein was confirmed with ultrasound image documentation. Needle exchanged over the 018 guidewire for transitional dilator, which allowed advancement of a Benson wire into the IVC. Over this, an MPA catheter was advanced. A Palindrome 23 hemodialysis  catheter was tunneled from the right anterior chest wall approach to the right IJ dermatotomy site. The MPA catheter was exchanged over an Amplatz wire for serial vascular dilators which allow placement of a peel-away sheath, through which the catheter was advanced under intermittent fluoroscopy, positioned with its tips in the proximal and midright atrium. Spot chest radiograph confirms good catheter position. No pneumothorax. Catheter was flushed and primed per protocol. Catheter secured externally with O Prolene sutures. The right IJ dermatotomy site was closed with Dermabond. COMPLICATIONS: COMPLICATIONS None immediate FLUOROSCOPY TIME:  48 seconds; 2 mGy COMPARISON:  None IMPRESSION: 1. Technically successful placement of tunneled right IJ hemodialysis catheter with ultrasound and fluoroscopic guidance. Ready for routine use. Electronically Signed   By: Eden Emms.D.  On: 11/02/2020 13:41   DG Chest Portable 1 View  Result Date: 11/01/2020 CLINICAL DATA:  Acute kidney injury, hypertension EXAM: PORTABLE CHEST 1 VIEW COMPARISON:  None. FINDINGS: The heart size and mediastinal contours are within normal limits. Both lungs are clear. The visualized skeletal structures are unremarkable. IMPRESSION: Negative. Electronically Signed   By: Rolm Baptise M.D.   On: 11/01/2020 18:20        Scheduled Meds: . amLODipine  5 mg Oral Daily  . calcium acetate (Phos Binder)  1,334 mg Oral TID WC  . Chlorhexidine Gluconate Cloth  6 each Topical Daily  . [START ON 11/04/2020] COVID-19 mRNA vaccine (Moderna)  0.5 mL Intramuscular ONCE-1600  . feeding supplement  1 Container Oral TID BM  . finasteride  5 mg Oral Daily  . heparin  5,000 Units Subcutaneous Q8H  . ondansetron  4 mg Oral Once  . senna-docusate  1 tablet Oral QHS  . tamsulosin  0.4 mg Oral QPC supper   Continuous Infusions: . cefTRIAXone (ROCEPHIN)  IV 1 g (11/02/20 2125)     LOS: 2 days        Hosie Poisson, MD [facility]   To contact the attending provider between 7A-7P or the covering provider during after hours 7P-7A, please log into the web site [email or web address] and access using universal [place] password for that web site. If you do not have the password, please call the hospital operator.  11/03/2020, 2:35 PM

## 2020-11-04 LAB — CBC
HCT: 21.1 % — ABNORMAL LOW (ref 39.0–52.0)
Hemoglobin: 7.2 g/dL — ABNORMAL LOW (ref 13.0–17.0)
MCH: 27.9 pg (ref 26.0–34.0)
MCHC: 34.1 g/dL (ref 30.0–36.0)
MCV: 81.8 fL (ref 80.0–100.0)
Platelets: 146 10*3/uL — ABNORMAL LOW (ref 150–400)
RBC: 2.58 MIL/uL — ABNORMAL LOW (ref 4.22–5.81)
RDW: 13.3 % (ref 11.5–15.5)
WBC: 7.7 10*3/uL (ref 4.0–10.5)
nRBC: 0 % (ref 0.0–0.2)

## 2020-11-04 LAB — BASIC METABOLIC PANEL
Anion gap: 13 (ref 5–15)
BUN: 75 mg/dL — ABNORMAL HIGH (ref 6–20)
CO2: 25 mmol/L (ref 22–32)
Calcium: 6.5 mg/dL — ABNORMAL LOW (ref 8.9–10.3)
Chloride: 98 mmol/L (ref 98–111)
Creatinine, Ser: 12.57 mg/dL — ABNORMAL HIGH (ref 0.61–1.24)
GFR, Estimated: 4 mL/min — ABNORMAL LOW (ref >60)
Glucose, Bld: 114 mg/dL — ABNORMAL HIGH (ref 70–99)
Potassium: 3.6 mmol/L (ref 3.5–5.1)
Sodium: 136 mmol/L (ref 135–145)

## 2020-11-04 LAB — CALCIUM, IONIZED: Calcium, Ionized, Serum: 3 mg/dL — ABNORMAL LOW (ref 4.5–5.6)

## 2020-11-04 NOTE — Progress Notes (Signed)
Patient ID: Daniel Mccarty, male   DOB: 03-31-1966, 55 y.o.   MRN: [id number] [facility] Progress Note   Assessment/ Plan:   1. 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  He has had 2 dialysis treatments so far for management of severe azotemia and appears to be doing better from a clinical standpoint.  It is unclear if he will have sufficient recovery enough to get off of dialysis or whether this will be a chronic treatment plan.  Will reevaluate labs tomorrow morning for additional timing of dialysis +/- vascular surgery consultation for permanent dialysis access. 2.  Nongap metabolic acidosis: This has improved with bicarbonate drip/dialysis. 3.  Hypokalemia: From transcellular shifts/management of acidemia and now corrected with oral replacement/HD. 4.  Urinary obstruction: This is likely chronic and related to benign prostatic hyperplasia, finasteride and tamsulosin with catheter now in place. 5.  Urinary tract infection: On intravenous ceftriaxone and without markers of sepsis. 6.  Hypocalcemia/hyperphosphatemia: These are likely indicators of chronic kidney disease with secondary hyperparathyroidism.  On calcium acetate 3 times daily AC. 7.  Anemia: Suspect anemia of chronic kidney disease, status post PRBC transfusion and started on Aranesp.  Subjective:   Reports to be feeling significantly better after his second hemodialysis treatment yesterday.   Objective:   BP (!) 143/95 (BP Location: Left Arm)   Pulse 97   Temp 98.7 F (37.1 C) (Oral)   Resp 18   Ht 6\' 1"  (1.854 m)   Wt 78.3 kg   SpO2 99%   BMI 22.77 kg/m   Intake/Output Summary (Last 24 hours) at 11/04/2020 0914 Last data filed at 11/04/2020 0700 Gross per 24 hour  Intake 196.64 ml  Output 2001 ml  Net -1804.36 ml   Weight change: 0.3 kg  Physical Exam: Gen:  Comfortably resting in bed CVS: Pulse regular rhythm, normal rate, S1 and S2 with ejection systolic murmur Resp: Anteriorly clear to auscultation, no rales/rhonchi Abd: Soft, flat, nontender, bowel sounds normal Ext: No lower extremity edema  Imaging: IR Fluoro Guide CV Line Right  Result Date: 11/02/2020 CLINICAL DATA:  Progressive renal insufficiency, needs access for hemodialysis EXAM: TUNNELED HEMODIALYSIS CATHETER PLACEMENT WITH ULTRASOUND AND FLUOROSCOPIC GUIDANCE TECHNIQUE: The procedure, risks, benefits, and alternatives were explained to the patient. Questions regarding the procedure were encouraged and answered. The patient understands and consents to the procedure. As antibiotic prophylaxis, cefazolin 2 g was ordered pre-procedure and administered intravenously within one hour of incision.Patency of the right IJ vein was confirmed with ultrasound with image documentation. An appropriate skin site was determined. Region was prepped using maximum barrier technique including cap and mask, sterile gown, sterile gloves, large sterile sheet, and Chlorhexidine as cutaneous antisepsis. The region was infiltrated locally with 1% lidocaine. Intravenous Fentanyl 67mcg and Versed 1mg  were administered as conscious sedation during continuous monitoring of the patient's level of consciousness and physiological / cardiorespiratory status by the radiology RN, with a total moderate sedation time of 15 minutes. Under real-time ultrasound guidance, the right IJ vein was accessed with a 21 gauge micropuncture needle; the needle tip within the vein was confirmed with ultrasound image documentation. Needle exchanged over the 018 guidewire for transitional dilator, which allowed advancement of a Benson wire into the IVC. Over this, an MPA catheter was advanced. A Palindrome 23 hemodialysis catheter was tunneled from the right anterior chest wall approach to the right IJ dermatotomy site.  The MPA catheter was exchanged  over an Amplatz wire for serial vascular dilators which allow placement of a peel-away sheath, through which the catheter was advanced under intermittent fluoroscopy, positioned with its tips in the proximal and midright atrium. Spot chest radiograph confirms good catheter position. No pneumothorax. Catheter was flushed and primed per protocol. Catheter secured externally with O Prolene sutures. The right IJ dermatotomy site was closed with Dermabond. COMPLICATIONS: COMPLICATIONS None immediate FLUOROSCOPY TIME:  48 seconds; 2 mGy COMPARISON:  None IMPRESSION: 1. Technically successful placement of tunneled right IJ hemodialysis catheter with ultrasound and fluoroscopic guidance. Ready for routine use. Electronically Signed   By: Lucrezia Europe M.D.   On: 11/02/2020 13:41   IR US Guide Vasc Access Right  Result Date: 11/02/2020 CLINICAL DATA:  Progressive renal insufficiency, needs access for hemodialysis EXAM: TUNNELED HEMODIALYSIS CATHETER PLACEMENT WITH ULTRASOUND AND FLUOROSCOPIC GUIDANCE TECHNIQUE: The procedure, risks, benefits, and alternatives were explained to the patient. Questions regarding the procedure were encouraged and answered. The patient understands and consents to the procedure. As antibiotic prophylaxis, cefazolin 2 g was ordered pre-procedure and administered intravenously within one hour of incision.Patency of the right IJ vein was confirmed with ultrasound with image documentation. An appropriate skin site was determined. Region was prepped using maximum barrier technique including cap and mask, sterile gown, sterile gloves, large sterile sheet, and Chlorhexidine as cutaneous antisepsis. The region was infiltrated locally with 1% lidocaine. Intravenous Fentanyl 86mcg and Versed 1mg  were administered as conscious sedation during continuous monitoring of the patient's level of consciousness and physiological / cardiorespiratory status by the radiology RN, with a total moderate sedation time of  15 minutes. Under real-time ultrasound guidance, the right IJ vein was accessed with a 21 gauge micropuncture needle; the needle tip within the vein was confirmed with ultrasound image documentation. Needle exchanged over the 018 guidewire for transitional dilator, which allowed advancement of a Benson wire into the IVC. Over this, an MPA catheter was advanced. A Palindrome 23 hemodialysis catheter was tunneled from the right anterior chest wall approach to the right IJ dermatotomy site. The MPA catheter was exchanged over an Amplatz wire for serial vascular dilators which allow placement of a peel-away sheath, through which the catheter was advanced under intermittent fluoroscopy, positioned with its tips in the proximal and midright atrium. Spot chest radiograph confirms good catheter position. No pneumothorax. Catheter was flushed and primed per protocol. Catheter secured externally with O Prolene sutures. The right IJ dermatotomy site was closed with Dermabond. COMPLICATIONS: COMPLICATIONS None immediate FLUOROSCOPY TIME:  48 seconds; 2 mGy COMPARISON:  None IMPRESSION: 1. Technically successful placement of tunneled right IJ hemodialysis catheter with ultrasound and fluoroscopic guidance. Ready for routine use. Electronically Signed   By: Lucrezia Europe M.D.   On: 11/02/2020 13:41    Labs: BMET Recent Labs  Lab 11/01/20 1523 11/01/20 1940 11/02/20 0618 11/03/20 0318 11/03/20 1440 11/04/20 0246  NA 130*  --  133* 134* 136 136  K 4.7  --  3.3* 2.7* 3.3* 3.6  CL 96*  --  95* 91* 91* 98  CO2 <7*  --  9* 23 26 25   GLUCOSE 132*  --  135* 153* 121* 114*  BUN 239*  --  231* 145* 137* 75*  CREATININE 33.39* 32.20* 33.43* 19.53* 19.54* 12.57*  CALCIUM 6.6*  --  6.3* 6.0* 5.8* 6.5*  PHOS  --   --  >30.0* 6.2* 7.1*  --    CBC Recent Labs  Lab 11/01/20 1523  11/01/20 1940 11/02/20 0618 11/03/20 0318 11/03/20 1439 11/04/20 0246  WBC 8.4   < > 8.5 7.8 8.3 7.7  NEUTROABS 7.5  --   --   --   --   --    HGB 5.8*   < > 7.2* 7.1* 7.0* 7.2*  HCT 18.2*   < > 20.6* 19.7* 19.9* 21.1*  MCV 84.3   < > 79.8* 77.3* 79.6* 81.8  PLT 207   < > 172 161 155 146*   < > = values in this interval not displayed.    Medications:    . amLODipine  5 mg Oral Daily  . calcium acetate (Phos Binder)  1,334 mg Oral TID WC  . Chlorhexidine Gluconate Cloth  6 each Topical Daily  . COVID-19 mRNA vaccine (Moderna)  0.5 mL Intramuscular ONCE-1600  . feeding supplement  1 Container Oral TID BM  . finasteride  5 mg Oral Daily  . heparin  5,000 Units Subcutaneous Q8H  . ondansetron  4 mg Oral Once  . senna-docusate  1 tablet Oral QHS  . tamsulosin  0.4 mg Oral QPC supper      Elmarie Shiley, MD 11/04/2020, 9:14 AM

## 2020-11-04 NOTE — Progress Notes (Signed)
PROGRESS NOTE    Daniel Mccarty  JJK:[id number] DOB: 1965-10-05 DOA: 11/01/2020 PCP: Patient, No Pcp Per (Inactive)    Chief Complaint  Patient presents with  . Hypertension    Brief Narrative:  Daniel Mccarty is a 55 y.o. male with medical history significant for essential hypertension, CKD, urinary retention, BPH, who presented to [facility] ED at the recommendation of his PCP due to hypertensive urgency with SBP in the 200s.  Patient has not seen a provider for more than 2 years.  He ran out of his oral antihypertensive and has not taken any prescribed medications for the last 2 years.  He finally made an appointment to see his PCP today due to worsening fatigue and with the hope of having refills of his prior medications. While in the ED labs were drawn and resulted with significant abnormalities.  BUN 239, creatinine 33, serum bicarb less than 7, hemoglobin 5.8K.  EDP contacted nephrology who will see in consultation.  Patient was started on isotonic bicarb.  TRH, hospitalist team, was asked to admit.   Nephrology consulted.  IR consulted for HD catheter placement. Pt underwent 2 HD sessions and his creatinine has improved. Pt reports feeling much better.   Assessment & Plan:   Active Problems:   AKI (acute kidney injury) ([place])   Acute kidney injury with nongap metabolic acidosis in the setting of CKD from uncontrolled hypertension. Tunneled HD catheter placed  by IR, underwent HD on 11/02/20 and on 11/03/20/ .  Plan for HD tomorrow.  Unclear if he will need long-term dialysis.  Continue to monitor renal parameters.  Nephrology on board and appreciate recommendations. Improvement in the creatinine from 33 to 19 to 12..  Avoid nephrotoxins and continue to monitor renal parameters. Bicarb wnl. D/c sodium bicarb gtt.    Urinary obstruction Probably chronic secondary to BPH Foley catheter in place and continue with finasteride and and Flomax. Negative 6 lit since admission.     Urinary tract  infection Urine cultures, negative, d/c rocephin.    Anemia of chronic disease probably secondary to CKD S/p PRBC transfusion and nephrology plan to start Aranesp by nephrology.  Transfuse to keep hemoglobin greater than 7.   Hypokalemia Replaced.   Hypertension:  Well controlled.   Continue with norvasc 5 mg daily.    DVT prophylaxis: Heparin Code Status: Full code Family Communication: None at bedside Disposition:   Status is: Inpatient  Remains inpatient appropriate because:Ongoing diagnostic testing needed not appropriate for outpatient work up and IV treatments appropriate due to intensity of illness or inability to take PO   Dispo:  Patient From: Home  Planned Disposition: Home  Medically stable for discharge: No         Consultants:   IR  NEPHROLOGY.    Procedures: none.   Antimicrobials:  Antibiotics Given (last 72 hours)    Date/Time Action Medication Dose Rate   11/01/20 2132 New Bag/Given   cefTRIAXone (ROCEPHIN) 1 g in sodium chloride 0.9 % 100 mL IVPB 1 g 200 mL/hr   11/02/20 1204 New Bag/Given   ceFAZolin (ANCEF) 2-4 GM/100ML-% IVPB 2,000 mg 200 mL/hr   11/02/20 2125 New Bag/Given   cefTRIAXone (ROCEPHIN) 1 g in sodium chloride 0.9 % 100 mL IVPB 1 g 200 mL/hr         Subjective: No new complaints.   Objective: Vitals:   11/03/20 2026 11/04/20 0225 11/04/20 0700 11/04/20 0853  BP: (!) 124/92 129/83  (!) 143/95  Pulse: 82 82  97  Resp: 17 18  18   Temp: 98.8 F (37.1 C) 98.7 F (37.1 C)  98.7 F (37.1 C)  TempSrc: Oral Oral  Oral  SpO2: 99% 95%  99%  Weight:   78.3 kg   Height:        Intake/Output Summary (Last 24 hours) at 11/04/2020 1049 Last data filed at 11/04/2020 0700 Gross per 24 hour  Intake 196.64 ml  Output 2000 ml  Net -1803.36 ml   Filed Weights   11/03/20 1434 11/03/20 1705 11/04/20 0700  Weight: 79 kg 79 kg 78.3 kg    Examination:  General exam: Elderly gentleman not in any kind of  distress Respiratory system: Clear to auscultation bilaterally, no wheezing or rhonchi Cardiovascular system: S1-S2 heard, regular rate rhythm, no JVD, no pedal edema Gastrointestinal system: Abdomen is soft nontender nondistended bowel sounds are normal Central nervous system: Alert and oriented, grossly nonfocal Extremities: No pedal edema Skin: Rashes seen Psychiatry: Mood is appropriate Data Reviewed: I have personally reviewed following labs and imaging studies  CBC: Recent Labs  Lab 11/01/20 1523 11/01/20 1940 11/02/20 0618 11/03/20 0318 11/03/20 1439 11/04/20 0246  WBC 8.4 8.3 8.5 7.8 8.3 7.7  NEUTROABS 7.5  --   --   --   --   --   HGB 5.8* 5.5* 7.2* 7.1* 7.0* 7.2*  HCT 18.2* 17.3* 20.6* 19.7* 19.9* 21.1*  MCV 84.3 84.8 79.8* 77.3* 79.6* 81.8  PLT 207 200 172 161 155 146*    Basic Metabolic Panel: Recent Labs  Lab 11/01/20 1523 11/01/20 1940 11/02/20 0618 11/03/20 0318 11/03/20 1440 11/04/20 0246  NA 130*  --  133* 134* 136 136  K 4.7  --  3.3* 2.7* 3.3* 3.6  CL 96*  --  95* 91* 91* 98  CO2 <7*  --  9* 23 26 25   GLUCOSE 132*  --  135* 153* 121* 114*  BUN 239*  --  231* 145* 137* 75*  CREATININE 33.39* 32.20* 33.43* 19.53* 19.54* 12.57*  CALCIUM 6.6*  --  6.3* 6.0* 5.8* 6.5*  MG  --   --  1.6*  --   --   --   PHOS  --   --  >30.0* 6.2* 7.1*  --     GFR: Estimated Creatinine Clearance: 7.4 mL/min (A) (by C-G formula based on SCr of 12.57 mg/dL (H)).  Liver Function Tests: Recent Labs  Lab 11/01/20 1807 11/03/20 0318 11/03/20 1440  AST 11*  --   --   ALT 11  --   --   ALKPHOS 50  --   --   BILITOT 0.7  --   --   PROT 8.0  --   --   ALBUMIN 4.2 3.3* 3.3*    CBG: No results for input(s): GLUCAP in the last 168 hours.   Recent Results (from the past 240 hour(s))  Culture, Urine     Status: None   Collection Time: 11/01/20  8:05 PM   Specimen: Urine, Random  Result Value Ref Range Status   Specimen Description URINE, RANDOM  Final   Special  Requests NONE  Final   Culture   Final    NO GROWTH Performed at [facility], [address] [place], [place] [address]    Report Status 11/03/2020 FINAL  Final  Resp Panel by RT-PCR (Flu A&B, Covid) Nasopharyngeal Swab     Status: None   Collection Time: 11/01/20  8:24 PM   Specimen: Nasopharyngeal Swab; Nasopharyngeal(NP) swabs  in vial transport medium  Result Value Ref Range Status   SARS Coronavirus 2 by RT PCR NEGATIVE NEGATIVE Final    Comment: (NOTE) SARS-CoV-2 target nucleic acids are NOT DETECTED.  The SARS-CoV-2 RNA is generally detectable in upper respiratory specimens during the acute phase of infection. The lowest concentration of SARS-CoV-2 viral copies this assay can detect is 138 copies/mL. A negative result does not preclude SARS-Cov-2 infection and should not be used as the sole basis for treatment or other patient management decisions. A negative result may occur with  improper specimen collection/handling, submission of specimen other than nasopharyngeal swab, presence of viral mutation(s) within the areas targeted by this assay, and inadequate number of viral copies(<138 copies/mL). A negative result must be combined with clinical observations, patient history, and epidemiological information. The expected result is Negative.  Fact Sheet for Patients:  EntrepreneurPulse.com.au  Fact Sheet for Healthcare Providers:  IncredibleEmployment.be  This test is no t yet approved or cleared by the [place] FDA and  has been authorized for detection and/or diagnosis of SARS-CoV-2 by FDA under an Emergency Use Authorization (EUA). This EUA will remain  in effect (meaning this test can be used) for the duration of the COVID-19 declaration under Section 564(b)(1) of the Act, 21 U.S.C.section 360bbb-3(b)(1), unless the authorization is terminated  or revoked sooner.       Influenza A by PCR NEGATIVE NEGATIVE Final    Influenza B by PCR NEGATIVE NEGATIVE Final    Comment: (NOTE) The Xpert Xpress SARS-CoV-2/FLU/RSV plus assay is intended as an aid in the diagnosis of influenza from Nasopharyngeal swab specimens and should not be used as a sole basis for treatment. Nasal washings and aspirates are unacceptable for Xpert Xpress SARS-CoV-2/FLU/RSV testing.  Fact Sheet for Patients: EntrepreneurPulse.com.au  Fact Sheet for Healthcare Providers: IncredibleEmployment.be  This test is not yet approved or cleared by the [place] FDA and has been authorized for detection and/or diagnosis of SARS-CoV-2 by FDA under an Emergency Use Authorization (EUA). This EUA will remain in effect (meaning this test can be used) for the duration of the COVID-19 declaration under Section 564(b)(1) of the Act, 21 U.S.C. section 360bbb-3(b)(1), unless the authorization is terminated or revoked.  Performed at [facility], [place] [address] [place], [place] [address]          Radiology Studies: IR Fluoro Guide CV Line Right  Result Date: 11/02/2020 CLINICAL DATA:  Progressive renal insufficiency, needs access for hemodialysis EXAM: TUNNELED HEMODIALYSIS CATHETER PLACEMENT WITH ULTRASOUND AND FLUOROSCOPIC GUIDANCE TECHNIQUE: The procedure, risks, benefits, and alternatives were explained to the patient. Questions regarding the procedure were encouraged and answered. The patient understands and consents to the procedure. As antibiotic prophylaxis, cefazolin 2 g was ordered pre-procedure and administered intravenously within one hour of incision.Patency of the right IJ vein was confirmed with ultrasound with image documentation. An appropriate skin site was determined. Region was prepped using maximum barrier technique including cap and mask, sterile gown, sterile gloves, large sterile sheet, and Chlorhexidine as cutaneous antisepsis. The region was infiltrated locally with 1%  lidocaine. Intravenous Fentanyl 68mcg and Versed 1mg  were administered as conscious sedation during continuous monitoring of the patient's level of consciousness and physiological / cardiorespiratory status by the radiology RN, with a total moderate sedation time of 15 minutes. Under real-time ultrasound guidance, the right IJ vein was accessed with a 21 gauge micropuncture needle; the needle tip within the vein was confirmed with ultrasound image documentation. Needle exchanged over the  018 guidewire for transitional dilator, which allowed advancement of a Benson wire into the IVC. Over this, an MPA catheter was advanced. A Palindrome 23 hemodialysis catheter was tunneled from the right anterior chest wall approach to the right IJ dermatotomy site. The MPA catheter was exchanged over an Amplatz wire for serial vascular dilators which allow placement of a peel-away sheath, through which the catheter was advanced under intermittent fluoroscopy, positioned with its tips in the proximal and midright atrium. Spot chest radiograph confirms good catheter position. No pneumothorax. Catheter was flushed and primed per protocol. Catheter secured externally with O Prolene sutures. The right IJ dermatotomy site was closed with Dermabond. COMPLICATIONS: COMPLICATIONS None immediate FLUOROSCOPY TIME:  48 seconds; 2 mGy COMPARISON:  None IMPRESSION: 1. Technically successful placement of tunneled right IJ hemodialysis catheter with ultrasound and fluoroscopic guidance. Ready for routine use. Electronically Signed   By: Lucrezia Europe M.D.   On: 11/02/2020 13:41   IR US Guide Vasc Access Right  Result Date: 11/02/2020 CLINICAL DATA:  Progressive renal insufficiency, needs access for hemodialysis EXAM: TUNNELED HEMODIALYSIS CATHETER PLACEMENT WITH ULTRASOUND AND FLUOROSCOPIC GUIDANCE TECHNIQUE: The procedure, risks, benefits, and alternatives were explained to the patient. Questions regarding the procedure were encouraged and  answered. The patient understands and consents to the procedure. As antibiotic prophylaxis, cefazolin 2 g was ordered pre-procedure and administered intravenously within one hour of incision.Patency of the right IJ vein was confirmed with ultrasound with image documentation. An appropriate skin site was determined. Region was prepped using maximum barrier technique including cap and mask, sterile gown, sterile gloves, large sterile sheet, and Chlorhexidine as cutaneous antisepsis. The region was infiltrated locally with 1% lidocaine. Intravenous Fentanyl 71mcg and Versed 1mg  were administered as conscious sedation during continuous monitoring of the patient's level of consciousness and physiological / cardiorespiratory status by the radiology RN, with a total moderate sedation time of 15 minutes. Under real-time ultrasound guidance, the right IJ vein was accessed with a 21 gauge micropuncture needle; the needle tip within the vein was confirmed with ultrasound image documentation. Needle exchanged over the 018 guidewire for transitional dilator, which allowed advancement of a Benson wire into the IVC. Over this, an MPA catheter was advanced. A Palindrome 23 hemodialysis catheter was tunneled from the right anterior chest wall approach to the right IJ dermatotomy site. The MPA catheter was exchanged over an Amplatz wire for serial vascular dilators which allow placement of a peel-away sheath, through which the catheter was advanced under intermittent fluoroscopy, positioned with its tips in the proximal and midright atrium. Spot chest radiograph confirms good catheter position. No pneumothorax. Catheter was flushed and primed per protocol. Catheter secured externally with O Prolene sutures. The right IJ dermatotomy site was closed with Dermabond. COMPLICATIONS: COMPLICATIONS None immediate FLUOROSCOPY TIME:  48 seconds; 2 mGy COMPARISON:  None IMPRESSION: 1. Technically successful placement of tunneled right IJ  hemodialysis catheter with ultrasound and fluoroscopic guidance. Ready for routine use. Electronically Signed   By: Lucrezia Europe M.D.   On: 11/02/2020 13:41        Scheduled Meds: . amLODipine  5 mg Oral Daily  . calcium acetate (Phos Binder)  1,334 mg Oral TID WC  . Chlorhexidine Gluconate Cloth  6 each Topical Daily  . COVID-19 mRNA vaccine (Moderna)  0.5 mL Intramuscular ONCE-1600  . feeding supplement  1 Container Oral TID BM  . finasteride  5 mg Oral Daily  . heparin  5,000 Units Subcutaneous Q8H  . ondansetron  4  mg Oral Once  . senna-docusate  1 tablet Oral QHS  . tamsulosin  0.4 mg Oral QPC supper   Continuous Infusions:    LOS: 3 days        Hosie Poisson, MD [facility]   To contact the attending provider between 7A-7P or the covering provider during after hours 7P-7A, please log into the web site [email or web address] and access using universal [place] password for that web site. If you do not have the password, please call the hospital operator.  11/04/2020, 10:49 AM

## 2020-11-05 DIAGNOSIS — D638 Anemia in other chronic diseases classified elsewhere: Secondary | ICD-10-CM | POA: Diagnosis present

## 2020-11-05 DIAGNOSIS — E876 Hypokalemia: Secondary | ICD-10-CM | POA: Diagnosis present

## 2020-11-05 DIAGNOSIS — N400 Enlarged prostate without lower urinary tract symptoms: Secondary | ICD-10-CM | POA: Diagnosis present

## 2020-11-05 LAB — BASIC METABOLIC PANEL
Anion gap: 13 (ref 5–15)
BUN: 85 mg/dL — ABNORMAL HIGH (ref 6–20)
CO2: 22 mmol/L (ref 22–32)
Calcium: 6.3 mg/dL — CL (ref 8.9–10.3)
Chloride: 99 mmol/L (ref 98–111)
Creatinine, Ser: 13.11 mg/dL — ABNORMAL HIGH (ref 0.61–1.24)
GFR, Estimated: 4 mL/min — ABNORMAL LOW (ref >60)
Glucose, Bld: 129 mg/dL — ABNORMAL HIGH (ref 70–99)
Potassium: 3.4 mmol/L — ABNORMAL LOW (ref 3.5–5.1)
Sodium: 134 mmol/L — ABNORMAL LOW (ref 135–145)

## 2020-11-05 LAB — HEMOGLOBIN AND HEMATOCRIT, BLOOD
HCT: 21.3 % — ABNORMAL LOW (ref 39.0–52.0)
Hemoglobin: 7 g/dL — ABNORMAL LOW (ref 13.0–17.0)

## 2020-11-05 LAB — PREPARE RBC (CROSSMATCH)

## 2020-11-05 MED ORDER — SODIUM CHLORIDE 0.9% IV SOLUTION: Freq: Once | INTRAVENOUS | Status: AC

## 2020-11-05 MED ORDER — CALCIUM GLUCONATE-NACL 1-0.675 GM/50ML-% IV SOLN
1.0000 g | Freq: Once | INTRAVENOUS | Status: AC
  Administered 2020-11-05: 1000 mg via INTRAVENOUS
  Filled 2020-11-05: qty 50

## 2020-11-05 MED ORDER — COVID-19 MRNA VACC (MODERNA) 50 MCG/0.25ML IM SUSP
0.2500 mL | Freq: Once | INTRAMUSCULAR | Status: DC
  Filled 2020-11-05: qty 0.25

## 2020-11-05 MED ORDER — COVID-19 MRNA VACC (MODERNA) 50 MCG/0.25ML IM SUSP
0.5000 mL | Freq: Once | INTRAMUSCULAR | Status: DC
  Filled 2020-11-05: qty 0.5

## 2020-11-05 NOTE — Progress Notes (Signed)
Lab called with a critical value result Calcium 6.3. Paged Dr Karleen Hampshire.

## 2020-11-05 NOTE — Progress Notes (Signed)
OT Cancellation Note  Patient Details Name: Dresean Beckel MRN: [id number] DOB: 19-Jan-1966   Cancelled Treatment:    Reason Eval/Treat Not Completed: Other (comment) patient with another discipline at this time. OT to check back as time allows.   Gloris Manchester OTR/L Supplemental OT, Department of rehab services [phone]  Torie Priebe R H. 11/05/2020, 8:47 AM

## 2020-11-05 NOTE — Progress Notes (Addendum)
PROGRESS NOTE    Daniel Mccarty  OEV:[id number] DOB: 12-22-1965 DOA: 11/01/2020 PCP: Patient, No Pcp Per (Inactive)    Chief Complaint  Patient presents with  . Hypertension    Brief Narrative:  Daniel Mccarty is a 55 y.o. male with medical history significant for essential hypertension, CKD, urinary retention, BPH, who presented to [facility] ED at the recommendation of his PCP due to hypertensive urgency with SBP in the 200s.  Patient has not seen a provider for more than 2 years.  He ran out of his oral antihypertensive and has not taken any prescribed medications for the last 2 years.  He finally made an appointment to see his PCP today due to worsening fatigue and with the hope of having refills of his prior medications. While in the ED labs were drawn and resulted with significant abnormalities.  BUN 239, creatinine 33, serum bicarb less than 7, hemoglobin 5.8K.  EDP contacted nephrology   Patient was started on isotonic bicarb.  TRH, hospitalist team, was asked to admit.   Nephrology consulted.  IR consulted for HD catheter placement. Pt underwent 2 HD sessions and his creatinine has improved.  He is scheduled for another HD session later today.  Of note he was also found to have urinary retention secondary to BPH.  Foley catheter was placed and he was started on Flomax.  Plan to do a voiding trial later today.  Assessment & Plan:   Active Problems:   AKI (acute kidney injury) ([place])   Acute kidney injury with nongap metabolic acidosis in the setting of CKD from uncontrolled hypertension. Tunneled HD catheter placed  by IR, underwent HD on 11/02/20 and on 11/03/20/ . Unclear if he will need long-term dialysis.  Continue to monitor renal parameters.  Nephrology on board and appreciate recommendations. Improvement in the creatinine from 33 to 19 to 12 to 13.Marland Kitchen  Avoid nephrotoxins and continue to monitor renal parameters. Bicarb wnl.  Discontinued sodium bicarb gtt.  He will probably need long-term  dialysis.   Urinary obstruction Probably chronic secondary to BPH Foley catheter in place and continue with finasteride and and Flomax. Negative 7 lit since admission.  We will plan for voiding trial later today    Abnormal urinalysis with negative cultures Urine cultures, negative, d/c rocephin.    Anemia of chronic disease probably secondary to CKD S/p 1 unit PRBC transfusion and nephrology plan to start Aranesp by nephrology.  Transfuse to keep hemoglobin greater than 7.   Hypokalemia Replaced.   Hypocalcemia:  Replaced. Recheck in am.    Hypertension:  Blood pressure parameters slightly elevated yesterday, recheck blood pressure parameters this morning and increase Norvasc to 10 mg daily if he continues to be hypertensive    DVT prophylaxis: Heparin Code Status: Full code Family Communication: None at bedside Disposition:   Status is: Inpatient  Remains inpatient appropriate because:Ongoing diagnostic testing needed not appropriate for outpatient work up and IV treatments appropriate due to intensity of illness or inability to take PO   Dispo:  Patient From: Home  Planned Disposition: Home  Medically stable for discharge: No         Consultants:   IR  NEPHROLOGY.    Procedures: none.   Antimicrobials:  Antibiotics Given (last 72 hours)    Date/Time Action Medication Dose Rate   11/02/20 1204 New Bag/Given   ceFAZolin (ANCEF) 2-4 GM/100ML-% IVPB 2,000 mg 200 mL/hr   11/02/20 2125 New Bag/Given   cefTRIAXone (ROCEPHIN) 1 g in sodium chloride  0.9 % 100 mL IVPB 1 g 200 mL/hr         Subjective: No chest pain shortness of breath nausea vomiting or abdominal pain  Objective: Vitals:   11/04/20 0853 11/04/20 1546 11/04/20 2046 11/05/20 0700  BP: (!) 143/95 (!) 144/88 (!) 141/98   Pulse: 97 97 (!) 111   Resp: 18 18 20    Temp: 98.7 F (37.1 C) 98.9 F (37.2 C) 98.9 F (37.2 C)   TempSrc: Oral Oral Oral   SpO2: 99% 100% 99%   Weight:     81.5 kg  Height:        Intake/Output Summary (Last 24 hours) at 11/05/2020 1050 Last data filed at 11/05/2020 0500 Gross per 24 hour  Intake 240 ml  Output 1400 ml  Net -1160 ml   Filed Weights   11/03/20 1705 11/04/20 0700 11/05/20 0700  Weight: 79 kg 78.3 kg 81.5 kg    Examination:  General exam: Well-developed gentleman not in any kind of distress Respiratory system: Clear to auscultation bilaterally, no wheezing or rhonchi Cardiovascular system: S1-S2 heard, regular rate rhythm, no JVD no pedal edema Gastrointestinal system: Abdomen is soft nontender bowel sounds normal Central nervous system: Alert and oriented, grossly nonfocal  Extremities: No pedal edema or cyanosis Skin: No rashes seen Psychiatry: Mood is Data Reviewed: I have personally reviewed following labs and imaging studies  CBC: Recent Labs  Lab 11/01/20 1523 11/01/20 1940 11/02/20 0618 11/03/20 0318 11/03/20 1439 11/04/20 0246  WBC 8.4 8.3 8.5 7.8 8.3 7.7  NEUTROABS 7.5  --   --   --   --   --   HGB 5.8* 5.5* 7.2* 7.1* 7.0* 7.2*  HCT 18.2* 17.3* 20.6* 19.7* 19.9* 21.1*  MCV 84.3 84.8 79.8* 77.3* 79.6* 81.8  PLT 207 200 172 161 155 146*    Basic Metabolic Panel: Recent Labs  Lab 11/02/20 0618 11/03/20 0318 11/03/20 1440 11/04/20 0246 11/05/20 0720  NA 133* 134* 136 136 134*  K 3.3* 2.7* 3.3* 3.6 3.4*  CL 95* 91* 91* 98 99  CO2 9* 23 26 25 22   GLUCOSE 135* 153* 121* 114* 129*  BUN 231* 145* 137* 75* 85*  CREATININE 33.43* 19.53* 19.54* 12.57* 13.11*  CALCIUM 6.3* 6.0* 5.8* 6.5* 6.3*  MG 1.6*  --   --   --   --   PHOS >30.0* 6.2* 7.1*  --   --     GFR: Estimated Creatinine Clearance: 7.3 mL/min (A) (by C-G formula based on SCr of 13.11 mg/dL (H)).  Liver Function Tests: Recent Labs  Lab 11/01/20 1807 11/03/20 0318 11/03/20 1440  AST 11*  --   --   ALT 11  --   --   ALKPHOS 50  --   --   BILITOT 0.7  --   --   PROT 8.0  --   --   ALBUMIN 4.2 3.3* 3.3*    CBG: No results  for input(s): GLUCAP in the last 168 hours.   Recent Results (from the past 240 hour(s))  Culture, Urine     Status: None   Collection Time: 11/01/20  8:05 PM   Specimen: Urine, Random  Result Value Ref Range Status   Specimen Description URINE, RANDOM  Final   Special Requests NONE  Final   Culture   Final    NO GROWTH Performed at [facility], [address], [place], [place] [address]    Report Status 11/03/2020 FINAL  Final  Resp Panel by RT-PCR (Flu A&B, Covid) Nasopharyngeal Swab     Status: None   Collection Time: 11/01/20  8:24 PM   Specimen: Nasopharyngeal Swab; Nasopharyngeal(NP) swabs in vial transport medium  Result Value Ref Range Status   SARS Coronavirus 2 by RT PCR NEGATIVE NEGATIVE Final    Comment: (NOTE) SARS-CoV-2 target nucleic acids are NOT DETECTED.  The SARS-CoV-2 RNA is generally detectable in upper respiratory specimens during the acute phase of infection. The lowest concentration of SARS-CoV-2 viral copies this assay can detect is 138 copies/mL. A negative result does not preclude SARS-Cov-2 infection and should not be used as the sole basis for treatment or other patient management decisions. A negative result may occur with  improper specimen collection/handling, submission of specimen other than nasopharyngeal swab, presence of viral mutation(s) within the areas targeted by this assay, and inadequate number of viral copies(<138 copies/mL). A negative result must be combined with clinical observations, patient history, and epidemiological information. The expected result is Negative.  Fact Sheet for Patients:  EntrepreneurPulse.com.au  Fact Sheet for Healthcare Providers:  IncredibleEmployment.be  This test is no t yet approved or cleared by the [place] FDA and  has been authorized for detection and/or diagnosis of SARS-CoV-2 by FDA under an Emergency Use Authorization (EUA). This EUA will remain   in effect (meaning this test can be used) for the duration of the COVID-19 declaration under Section 564(b)(1) of the Act, 21 U.S.C.section 360bbb-3(b)(1), unless the authorization is terminated  or revoked sooner.       Influenza A by PCR NEGATIVE NEGATIVE Final   Influenza B by PCR NEGATIVE NEGATIVE Final    Comment: (NOTE) The Xpert Xpress SARS-CoV-2/FLU/RSV plus assay is intended as an aid in the diagnosis of influenza from Nasopharyngeal swab specimens and should not be used as a sole basis for treatment. Nasal washings and aspirates are unacceptable for Xpert Xpress SARS-CoV-2/FLU/RSV testing.  Fact Sheet for Patients: EntrepreneurPulse.com.au  Fact Sheet for Healthcare Providers: IncredibleEmployment.be  This test is not yet approved or cleared by the [place] FDA and has been authorized for detection and/or diagnosis of SARS-CoV-2 by FDA under an Emergency Use Authorization (EUA). This EUA will remain in effect (meaning this test can be used) for the duration of the COVID-19 declaration under Section 564(b)(1) of the Act, 21 U.S.C. section 360bbb-3(b)(1), unless the authorization is terminated or revoked.  Performed at [facility], [place] [address] [place], [place] [address]          Radiology Studies: No results found.      Scheduled Meds: . amLODipine  5 mg Oral Daily  . calcium acetate (Phos Binder)  1,334 mg Oral TID WC  . Chlorhexidine Gluconate Cloth  6 each Topical Daily  . COVID-19 mRNA vaccine (Moderna)  0.5 mL Intramuscular ONCE-1600  . feeding supplement  1 Container Oral TID BM  . finasteride  5 mg Oral Daily  . heparin  5,000 Units Subcutaneous Q8H  . ondansetron  4 mg Oral Once  . senna-docusate  1 tablet Oral QHS  . tamsulosin  0.4 mg Oral QPC supper   Continuous Infusions: . calcium gluconate       LOS: 4 days        Hosie Poisson, MD [facility]   To contact the  attending provider between 7A-7P or the covering provider during after hours 7P-7A, please log into the web site [email or web address] and access using universal [place] password for that web site.  If you do not have the password, please call the hospital operator.  11/05/2020, 10:50 AM

## 2020-11-05 NOTE — Progress Notes (Signed)
Patient ID: Daniel Mccarty, male   DOB: 03/05/1966, 55 y.o.   MRN: [id number] S: Feels well, no complaints. O:BP (!) 141/98 (BP Location: Left Arm)   Pulse (!) 111   Temp 98.9 F (37.2 C) (Oral)   Resp 20   Ht 6\' 1"  (1.854 m)   Wt 81.5 kg   SpO2 99%   BMI 23.71 kg/m   Intake/Output Summary (Last 24 hours) at 11/05/2020 1439 Last data filed at 11/05/2020 1200 Gross per 24 hour  Intake 240 ml  Output 1900 ml  Net -1660 ml   Intake/Output: I/O last 3 completed shifts: In: 240 [P.O.:240] Out: 2700 [Urine:2700]  Intake/Output this shift:  Total I/O In: -  Out: 500 [Urine:500] Weight change: 2.512 kg Gen: NAD CVS: tachy at 111 Resp: CTA Abd: benign Ext: no edema  Recent Labs  Lab 11/01/20 1523 11/01/20 1807 11/01/20 1940 11/02/20 0618 11/03/20 0318 11/03/20 1440 11/04/20 0246 11/05/20 0720  NA 130*  --   --  133* 134* 136 136 134*  K 4.7  --   --  3.3* 2.7* 3.3* 3.6 3.4*  CL 96*  --   --  95* 91* 91* 98 99  CO2 <7*  --   --  9* 23 26 25 22   GLUCOSE 132*  --   --  135* 153* 121* 114* 129*  BUN 239*  --   --  231* 145* 137* 75* 85*  CREATININE 33.39*  --  32.20* 33.43* 19.53* 19.54* 12.57* 13.11*  ALBUMIN  --  4.2  --   --  3.3* 3.3*  --   --   CALCIUM 6.6*  --   --  6.3* 6.0* 5.8* 6.5* 6.3*  PHOS  --   --   --  >30.0* 6.2* 7.1*  --   --   AST  --  11*  --   --   --   --   --   --   ALT  --  11  --   --   --   --   --   --    Liver Function Tests: Recent Labs  Lab 11/01/20 1807 11/03/20 0318 11/03/20 1440  AST 11*  --   --   ALT 11  --   --   ALKPHOS 50  --   --   BILITOT 0.7  --   --   PROT 8.0  --   --   ALBUMIN 4.2 3.3* 3.3*   No results for input(s): LIPASE, AMYLASE in the last 168 hours. No results for input(s): AMMONIA in the last 168 hours. CBC: Recent Labs  Lab 11/01/20 1523 11/01/20 1940 11/02/20 0618 11/03/20 0318 11/03/20 1439 11/04/20 0246 11/05/20 0720  WBC 8.4 8.3 8.5 7.8 8.3 7.7  --   NEUTROABS 7.5  --   --   --   --   --   --    HGB 5.8* 5.5* 7.2* 7.1* 7.0* 7.2* 7.0*  HCT 18.2* 17.3* 20.6* 19.7* 19.9* 21.1* 21.3*  MCV 84.3 84.8 79.8* 77.3* 79.6* 81.8  --   PLT 207 200 172 161 155 146*  --    Cardiac Enzymes: No results for input(s): CKTOTAL, CKMB, CKMBINDEX, TROPONINI in the last 168 hours. CBG: No results for input(s): GLUCAP in the last 168 hours.  Iron Studies: No results for input(s): IRON, TIBC, TRANSFERRIN, FERRITIN in the last 72 hours. Studies/Results: No results found. Marland Kitchen amLODipine  5 mg Oral Daily  . calcium acetate (Phos  Binder)  1,334 mg Oral TID WC  . Chlorhexidine Gluconate Cloth  6 each Topical Daily  . COVID-19 mRNA vaccine (Moderna)  0.5 mL Intramuscular ONCE-1600  . feeding supplement  1 Container Oral TID BM  . finasteride  5 mg Oral Daily  . heparin  5,000 Units Subcutaneous Q8H  . ondansetron  4 mg Oral Once  . senna-docusate  1 tablet Oral QHS  . tamsulosin  0.4 mg Oral QPC supper    BMET    Component Value Date/Time   NA 134 (L) 11/05/2020 0720   K 3.4 (L) 11/05/2020 0720   CL 99 11/05/2020 0720   CO2 22 11/05/2020 0720   GLUCOSE 129 (H) 11/05/2020 0720   BUN 85 (H) 11/05/2020 0720   CREATININE 13.11 (H) 11/05/2020 0720   CALCIUM 6.3 (LL) 11/05/2020 0720   GFRNONAA 4 (L) 11/05/2020 0720   GFRAA 25 (L) 08/15/2018 0728   CBC    Component Value Date/Time   WBC 7.7 11/04/2020 0246   RBC 2.58 (L) 11/04/2020 0246   HGB 7.0 (L) 11/05/2020 0720   HCT 21.3 (L) 11/05/2020 0720   PLT 146 (L) 11/04/2020 0246   MCV 81.8 11/04/2020 0246   MCH 27.9 11/04/2020 0246   MCHC 34.1 11/04/2020 0246   RDW 13.3 11/04/2020 0246   LYMPHSABS 0.2 (L) 11/01/2020 1523   MONOABS 0.6 11/01/2020 1523   EOSABS 0.0 11/01/2020 1523   BASOSABS 0.0 11/01/2020 1523     Assessment/Plan:  1. 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  Renal US with marked bilateral hydronephrosis.  Significant  improvement with Foley catheter, but still with significantly elevated BUN/Cr and s/p HD x 2.  Last HD session 11/03/20.  Will recheck labs in am before deciding on further HD.   2. Urinary obstruction - bilateral hydronephrosis, present since 2020 but progressed since then.  Likely chronic.  Foley catheter removed today and will need to follow PVR.  He may benefit from urology evaluation if remains an issue.  Currently on finasteride and tamsulosin. 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  Receiving IV calcium and started on calcium acetate binders qac.  PTH elevated at 406 and will start calcitriol.  4. Anemia of CKD stage IV-V:  S/p blood transfusion and now on ESA. 5. Vascular access:  Had RIJ Dana placed 11/02/20 and will need Vascular surgery evaluation for placement of AVF/AVG given advanced CKD stage IV dating back to 2020. 6. Hypertensive urgency:  Improved BP with meds, had not taken any for the past 2 years.   Donetta Potts, MD [employer] [phone]

## 2020-11-05 NOTE — Progress Notes (Signed)
Occupational Therapy Treatment Patient Details Name: Daniel Mccarty MRN: [id number] DOB: Jun 03, 1965 Today's Date: 11/05/2020    History of present illness Patient is a 55 y/o male who presents on 11/01/20 from PCP due to elevated BP. Admitted with Hypertensive urgency, AKI on CKD IV, metabolic acidosis and severe anemia. S/p tunneled HD cath placement and initation of HD. PMH includes HTN, BPH and urinary retention.   OT comments  Patient progressing toward goals this date transitioning from supine to EOB with Mod I. BP 130's over 80's. Patient completed grooming standing at sink level with supervision A for safety. Patient with episode of angry outburst when ADL items fell into the sink. This OT prompted patient that moving ADL items from directly in front of the sink would likely prevent this from happening again. Patient demonstrates poor problem solving by choosing to place items on floor instead of moving towels on sink surface and placing ADL items beside sink. Patient later apologized for outburst and expressed frustration with requiring assistance for things he was able to do for himself PTA. OT provided emotional support. OT will continue to follow acutely.    Follow Up Recommendations  No OT follow up    Equipment Recommendations  None recommended by OT    Recommendations for Other Services      Precautions / Restrictions Precautions Precautions: Fall Precaution Comments: watch BP Restrictions Weight Bearing Restrictions: No       Mobility Bed Mobility Overal bed mobility: Modified Independent                  Transfers Overall transfer level: Needs assistance Equipment used: None Transfers: Sit to/from Stand Sit to Stand: Supervision         General transfer comment: Supervision A for safety.    Balance Overall balance assessment: Needs assistance         Standing balance support: No upper extremity supported;During functional activity Standing  balance-Leahy Scale: Fair Standing balance comment: Patient with mild balance deficits. No overt LOB.                           ADL either performed or assessed with clinical judgement   ADL Overall ADL's : Needs assistance/impaired     Grooming: Wash/dry hands;Wash/dry face;Oral care;Supervision/safety;Standing Grooming Details (indicate cue type and reason): Grooming standing at sink level with supervision A for safety. Patient with low frustration tolerance. States that he is used to doing things for himself.                                     Vision       Perception     Praxis      Cognition Arousal/Alertness: Awake/alert Behavior During Therapy: WFL for tasks assessed/performed Overall Cognitive Status: Impaired/Different from baseline Area of Impairment: Problem solving                 Orientation Level: Disoriented to;Time   Memory: Decreased short-term memory       Problem Solving: Slow processing;Requires verbal cues General Comments: Patient required increased time to sequence oral hygiene standing at sink level. Poor problem solving noted.        Exercises     Shoulder Instructions       General Comments      Pertinent Vitals/ Pain       Pain Assessment: No/denies pain  Home Living                                          Prior Functioning/Environment              Frequency  Min 2X/week        Progress Toward Goals  OT Goals(current goals can now be found in the care plan section)  Progress towards OT goals: Progressing toward goals  Acute Rehab OT Goals Patient Stated Goal: To return home. OT Goal Formulation: With patient Time For Goal Achievement: 11/17/20 Potential to Achieve Goals: Fair ADL Goals Pt Will Perform Grooming: Independently;standing Pt Will Transfer to Toilet: Independently;ambulating;regular height toilet  Plan Discharge plan remains appropriate;Frequency  remains appropriate    Co-evaluation                 AM-PAC OT "6 Clicks" Daily Activity     Outcome Measure   Help from another person eating meals?: None Help from another person taking care of personal grooming?: None Help from another person toileting, which includes using toliet, bedpan, or urinal?: A Little Help from another person bathing (including washing, rinsing, drying)?: None Help from another person to put on and taking off regular upper body clothing?: None Help from another person to put on and taking off regular lower body clothing?: None 6 Click Score: 23    End of Session Equipment Utilized During Treatment: Gait belt  OT Visit Diagnosis: Muscle weakness (generalized) (M62.81);Dizziness and giddiness (R42)   Activity Tolerance Patient tolerated treatment well   Patient Left in chair;with call bell/phone within reach   Nurse Communication Mobility status        Time: 1130-1150 OT Time Calculation (min): 20 min  Charges: OT General Charges $OT Visit: 1 Visit OT Treatments $Self Care/Home Management : 8-22 mins  Tiffiny Worthy H. OTR/L Supplemental OT, Department of rehab services [phone]   Vinnie Bobst R H. 11/05/2020, 12:09 PM

## 2020-11-05 NOTE — Progress Notes (Signed)
Physical Therapy Treatment Patient Details Name: Daniel Mccarty MRN: [id number] DOB: 1965-11-27 Today's Date: 11/05/2020    History of Present Illness Patient is a 55 y/o male who presents on 11/01/20 from PCP due to elevated BP. Admitted with Hypertensive urgency, AKI on CKD IV, metabolic acidosis and severe anemia. S/p tunneled HD cath placement 11/02/20 and initation of HD. PMH includes HTN, BPH and urinary retention.    PT Comments    Patient progressing well towards PT goals. Reports feeling better post HD sessions. Improved ambulation distance with Min guard-supervision for safety. Noted to have some drifting and antalgic like gait esp when fatigued post stair negotiation. Tolerated stair training with Min guard assist with 1 instance of LOB when descending steps but able to catch self on rail. Reports legs feel like noodles post stairs. BP supine 157/89 pre activity and BP supine post activity 176/91. Asymptomatic. Encouraged increasing activity. Continue to note some cognitive deficits? Will continue to follow.   Follow Up Recommendations  No PT follow up;Supervision - Intermittent     Equipment Recommendations  None recommended by PT    Recommendations for Other Services       Precautions / Restrictions Precautions Precautions: Fall;Other (comment) Precaution Comments: watch BP Restrictions Weight Bearing Restrictions: No    Mobility  Bed Mobility Overal bed mobility: Modified Independent Bed Mobility: Supine to Sit;Sit to Supine     Supine to sit: Modified independent (Device/Increase time) Sit to supine: Modified independent (Device/Increase time)   General bed mobility comments: HOB flat, no assist needed.    Transfers Overall transfer level: Needs assistance Equipment used: None Transfers: Sit to/from Stand Sit to Stand: Supervision         General transfer comment: Supervision for safety. Stood from [employer].  Ambulation/Gait Ambulation/Gait assistance:  Supervision;Min guard Gait Distance (Feet): 300 Feet Assistive device: None Gait Pattern/deviations: Step-through pattern;Decreased stride length;Drifts right/left;Antalgic Gait velocity: decreased Gait velocity interpretation: 1.31 - 2.62 ft/sec, indicative of limited community ambulator General Gait Details: Slow, mildly unsteady gait with IR of LLE and some drifting. HR up to 123 bpm.   Stairs Stairs: Yes Stairs assistance: Min guard Stair Management: One rail Left;Alternating pattern;Step to pattern;Forwards Number of Stairs: 6 General stair comments: Cues for technique/safety, initially LOB when stepping down to descend but able to catch self with rail.   Wheelchair Mobility    Modified Rankin (Stroke Patients Only)       Balance Overall balance assessment: Needs assistance Sitting-balance support: Feet supported;No upper extremity supported Sitting balance-Leahy Scale: Good     Standing balance support: During functional activity Standing balance-Leahy Scale: Good Standing balance comment: Able to reach down and pick up mask off floor without LOB.                            Cognition Arousal/Alertness: Awake/alert Behavior During Therapy: WFL for tasks assessed/performed Overall Cognitive Status: Impaired/Different from baseline Area of Impairment: Problem solving;Orientation;Memory                 Orientation Level: Disoriented to;Time   Memory: Decreased short-term memory       Problem Solving: Slow processing;Requires verbal cues General Comments: Patient required increased time to sequence oral hygiene standing at sink level. Poor problem solving noted.      Exercises      General Comments General comments (skin integrity, edema, etc.): BP supine 157/89 pre activity and BP supine post activity 176/91. Asymptomatic.  Pertinent Vitals/Pain Pain Assessment: No/denies pain    Home Living                      Prior  Function            PT Goals (current goals can now be found in the care plan section) Acute Rehab PT Goals Patient Stated Goal: To return home. Progress towards PT goals: Progressing toward goals    Frequency    Min 3X/week      PT Plan Current plan remains appropriate    Co-evaluation              AM-PAC PT "6 Clicks" Mobility   Outcome Measure  Help needed turning from your back to your side while in a flat bed without using bedrails?: None Help needed moving from lying on your back to sitting on the side of a flat bed without using bedrails?: None Help needed moving to and from a bed to a chair (including a wheelchair)?: None Help needed standing up from a chair using your arms (e.g., wheelchair or bedside chair)?: A Little Help needed to walk in hospital room?: A Little Help needed climbing 3-5 steps with a railing? : A Little 6 Click Score: 21    End of Session Equipment Utilized During Treatment: Gait belt Activity Tolerance: Patient tolerated treatment well Patient left: in bed;with call bell/phone within reach Nurse Communication: Mobility status PT Visit Diagnosis: Unsteadiness on feet (R26.81);Difficulty in walking, not elsewhere classified (R26.2)     Time: 0626-9485 PT Time Calculation (min) (ACUTE ONLY): 17 min  Charges:  $Gait Training: 8-22 mins                     Marisa Severin, PT, DPT Acute Rehabilitation Services Pager [phone] Office [place] 11/05/2020, 12:27 PM

## 2020-11-06 DIAGNOSIS — D638 Anemia in other chronic diseases classified elsewhere: Secondary | ICD-10-CM

## 2020-11-06 DIAGNOSIS — I10 Essential (primary) hypertension: Secondary | ICD-10-CM

## 2020-11-06 DIAGNOSIS — N400 Enlarged prostate without lower urinary tract symptoms: Secondary | ICD-10-CM

## 2020-11-06 DIAGNOSIS — N320 Bladder-neck obstruction: Secondary | ICD-10-CM

## 2020-11-06 DIAGNOSIS — N289 Disorder of kidney and ureter, unspecified: Secondary | ICD-10-CM

## 2020-11-06 DIAGNOSIS — E876 Hypokalemia: Secondary | ICD-10-CM

## 2020-11-06 LAB — CBC WITH DIFFERENTIAL/PLATELET
Abs Immature Granulocytes: 0.07 10*3/uL (ref 0.00–0.07)
Basophils Absolute: 0 10*3/uL (ref 0.0–0.1)
Basophils Relative: 0 %
Eosinophils Absolute: 0.1 10*3/uL (ref 0.0–0.5)
Eosinophils Relative: 1 %
HCT: 23.2 % — ABNORMAL LOW (ref 39.0–52.0)
Hemoglobin: 7.7 g/dL — ABNORMAL LOW (ref 13.0–17.0)
Immature Granulocytes: 1 %
Lymphocytes Relative: 5 %
Lymphs Abs: 0.4 10*3/uL — ABNORMAL LOW (ref 0.7–4.0)
MCH: 28.2 pg (ref 26.0–34.0)
MCHC: 33.2 g/dL (ref 30.0–36.0)
MCV: 85 fL (ref 80.0–100.0)
Monocytes Absolute: 1 10*3/uL (ref 0.1–1.0)
Monocytes Relative: 11 %
Neutro Abs: 7.6 10*3/uL (ref 1.7–7.7)
Neutrophils Relative %: 82 %
Platelets: 161 10*3/uL (ref 150–400)
RBC: 2.73 MIL/uL — ABNORMAL LOW (ref 4.22–5.81)
RDW: 12.8 % (ref 11.5–15.5)
WBC: 9.3 10*3/uL (ref 4.0–10.5)
nRBC: 0 % (ref 0.0–0.2)

## 2020-11-06 LAB — BPAM RBC
Blood Product Expiration Date: 202207062359
ISSUE DATE / TIME: 202206062316
Unit Type and Rh: 5100

## 2020-11-06 LAB — TYPE AND SCREEN
ABO/RH(D): O POS
Antibody Screen: NEGATIVE
Unit division: 0

## 2020-11-06 LAB — BASIC METABOLIC PANEL
Anion gap: 15 (ref 5–15)
BUN: 94 mg/dL — ABNORMAL HIGH (ref 6–20)
CO2: 21 mmol/L — ABNORMAL LOW (ref 22–32)
Calcium: 6.4 mg/dL — CL (ref 8.9–10.3)
Chloride: 97 mmol/L — ABNORMAL LOW (ref 98–111)
Creatinine, Ser: 12.96 mg/dL — ABNORMAL HIGH (ref 0.61–1.24)
GFR, Estimated: 4 mL/min — ABNORMAL LOW (ref >60)
Glucose, Bld: 123 mg/dL — ABNORMAL HIGH (ref 70–99)
Potassium: 3.4 mmol/L — ABNORMAL LOW (ref 3.5–5.1)
Sodium: 133 mmol/L — ABNORMAL LOW (ref 135–145)

## 2020-11-06 MED ORDER — CALCITRIOL 0.25 MCG PO CAPS
0.2500 ug | ORAL_CAPSULE | Freq: Every day | ORAL | Status: DC
  Administered 2020-11-06 – 2020-11-13 (×8): 0.25 ug via ORAL
  Filled 2020-11-06 (×8): qty 1

## 2020-11-06 MED ORDER — DARBEPOETIN ALFA 60 MCG/0.3ML IJ SOSY
60.0000 ug | PREFILLED_SYRINGE | INTRAMUSCULAR | Status: DC
Start: 2020-11-06 — End: 2020-11-14
  Administered 2020-11-06: 60 ug via SUBCUTANEOUS
  Filled 2020-11-06 (×2): qty 0.3

## 2020-11-06 NOTE — Progress Notes (Signed)
PROGRESS NOTE    Daniel Mccarty  TLX:[id number] DOB: 01/02/1966 DOA: 11/01/2020 PCP: Patient, No Pcp Per (Inactive)    Brief Narrative:  Daniel Mccarty is a 55 year old male with past medical history significant for essential hypertension, CKD stage IV, urinary retention, BPH who presented to [facility] ED on 6/2 at the recommendation of his PCP due to hypertensive urgency with SBP in the 200s.  Patient has not seen a provider for more than 2 years and has been off his antihypertensives for the last 2 years since he has had no refills.  Patient reports progressive fatigue.  In the ED, patient was noted to have a BUN of 239 with a creatinine of 33, serum bicarbonate less than 7, hemoglobin 5.8, platelets 207.  Bicarb drip.  Nephrology was consulted.  TRH consulted for further evaluation and management of hypertensive urgency, and progression of renal failure.   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  Found to have elevated BUN of 239 with a creatinine of 33.  SBP in the 200s on ED arrival.  Patient has been off antihypertensive treatment for the last 2 years due to noncompliance with outpatient visits.  Renal ultrasound with bilateral hydronephrosis with distended urinary bladder with associated renal cortical thinning and increased parenchymal echogenicity suggesting chronic cause. --Nephrology following, appreciate assistance --Cr 33>>>12.96 --Continue HD per nephrology; will likely need outpatient HD seat and further work-up for vascular access this admission per nephrology.  Hypertensive urgency Patient presenting with SBP in the 200s.  Has been off of antihypertensives  for 2 years due to noncompliance with medical therapy. BP 129/89 this morning, well controlled -- Amlodipine 5 mg p.o. daily  Obstructive uropathy BPH Ultrasound on 6/2 with marked bilateral hydronephrosis with renal cortical thinning and increased parenchymal echogenicity suggesting chronic, distended urinary bladder and mild bladder wall thickening.  Etiology likely secondary to chronic BPH causing obstruction and now superimposed medical renal disease.  Foley catheter initially placed which was discontinued on 11/05/2020. --Tamsulosin 0.4 mg p.o. daily --Finasteride 5 mg p.o. daily --Bladder scan every 8 hours following void  Anemia of chronic renal disease Hemoglobin 5.8 on admission.  Transfuse 2 unit PRBC on 6/2 and 1 unit PRBC on 6/6.  Iron 123, TIBC 206, ferritin 374, folate 7.1, vitamin B12 1063. --Hgb 5.8>>>7.7 --Transfuse for hemoglobin less than 7.0  Hypocalcemia Received 2 g calcium gluconate on 6/2 and 1 g calcium gluconate on 6/6. --Repeat calcium in the a.m. to include ionized calcium and albumin   DVT prophylaxis: heparin injection 5,000 Units Start: 11/01/20 2200 SCDs Start: 11/01/20 1941    Code Status: Full Code Family Communication: None present at bedside this morning  Disposition Plan:  Level of care: Progressive Status is: Inpatient  Remains inpatient appropriate because:Ongoing diagnostic testing needed not appropriate for outpatient work up, Unsafe d/c plan, IV treatments appropriate due to intensity of illness or inability to take PO and Inpatient level of care appropriate due to severity of illness   Dispo:  Patient From: Home  Planned Disposition: Home  Medically stable for discharge: No       Consultants:   Nephrology  Interventional radiology  Procedures:  Catheter placement, IR 6/3  Antimicrobials:   None   Subjective: Patient seen and examined at bedside, resting comfortably.  No complaints this morning.  Foley removed  yesterday, reports able to void without much issue.  No other concerns or questions at this time.  Denies headache, no dizziness, no fever/chills/night sweats, no nausea/vomiting/diarrhea, no chest pain, no palpitations, no shortness of breath, no abdominal pain, no weakness, no fatigue, no paresthesias.  No acute events overnight per nursing staff.  Objective: Vitals:   11/05/20 2322 11/05/20 2338 11/06/20 0340 11/06/20 0639  BP: 134/87 138/81 137/76 129/89  Pulse: 88 89 89 89  Resp:  19 18 17   Temp:  98.8 F (37.1 C) 98.8 F (37.1 C) 99.1 F (37.3 C)  TempSrc:  Oral Oral Oral  SpO2: 99% 100% 100% 100%  Weight:    84.3 kg  Height:        Intake/Output Summary (Last 24 hours) at 11/06/2020 1336 Last data filed at 11/06/2020 0345 Gross per 24 hour  Intake 930 ml  Output 1 ml  Net 929 ml   Filed Weights   11/04/20 0700 11/05/20 0700 11/06/20 0639  Weight: 78.3 kg 81.5 kg 84.3 kg    Examination:  General exam: Appears calm and comfortable  Respiratory system: Clear to auscultation. Respiratory effort normal. Cardiovascular system: S1 & S2 heard, RRR. No JVD, murmurs, rubs, gallops or clicks. No pedal edema.  Tunneled HD catheter noted right chest. Gastrointestinal system: Abdomen is nondistended, soft and nontender. No organomegaly or masses felt. Normal bowel sounds heard. Central nervous system: Alert and oriented. No focal neurological deficits. Extremities: Symmetric 5 x 5 power. Skin: No rashes, lesions or ulcers Psychiatry: Judgement and insight appear normal. Mood & affect appropriate.     Data Reviewed: I have personally reviewed following labs and imaging studies  CBC: Recent Labs  Lab 11/01/20 1523 11/01/20 1940 11/02/20 0618 11/03/20 0318 11/03/20 1439 11/04/20 0246 11/05/20 0720 11/06/20 0540  WBC 8.4   < > 8.5 7.8 8.3 7.7  --  9.3  NEUTROABS 7.5  --   --   --   --   --   --  7.6  HGB 5.8*   < > 7.2* 7.1* 7.0* 7.2* 7.0* 7.7*  HCT 18.2*   < > 20.6* 19.7*  19.9* 21.1* 21.3* 23.2*  MCV 84.3   < > 79.8* 77.3* 79.6* 81.8  --  85.0  PLT 207   < > 172 161 155 146*  --  161   < > = values in this interval not displayed.   Basic Metabolic Panel: Recent Labs  Lab 11/02/20 0618 11/03/20 0318 11/03/20 1440 11/04/20 0246 11/05/20 0720 11/06/20 0540  NA 133* 134* 136 136 134* 133*  K 3.3* 2.7* 3.3* 3.6 3.4* 3.4*  CL 95* 91* 91* 98 99 97*  CO2 9* 23 26 25 22  21*  GLUCOSE 135* 153* 121* 114* 129* 123*  BUN 231* 145* 137* 75* 85* 94*  CREATININE 33.43* 19.53* 19.54* 12.57* 13.11* 12.96*  CALCIUM 6.3* 6.0* 5.8* 6.5* 6.3* 6.4*  MG 1.6*  --   --   --   --   --   PHOS >30.0* 6.2* 7.1*  --   --   --    GFR: Estimated Creatinine Clearance: 7.4 mL/min (A) (by C-G formula based on SCr of 12.96 mg/dL (H)). Liver Function Tests: Recent Labs  Lab 11/01/20 1807 11/03/20 0318 11/03/20 1440  AST 11*  --   --  ALT 11  --   --   ALKPHOS 50  --   --   BILITOT 0.7  --   --   PROT 8.0  --   --   ALBUMIN 4.2 3.3* 3.3*   No results for input(s): LIPASE, AMYLASE in the last 168 hours. No results for input(s): AMMONIA in the last 168 hours. Coagulation Profile: No results for input(s): INR, PROTIME in the last 168 hours. Cardiac Enzymes: No results for input(s): CKTOTAL, CKMB, CKMBINDEX, TROPONINI in the last 168 hours. BNP (last 3 results) No results for input(s): PROBNP in the last 8760 hours. HbA1C: No results for input(s): HGBA1C in the last 72 hours. CBG: No results for input(s): GLUCAP in the last 168 hours. Lipid Profile: No results for input(s): CHOL, HDL, LDLCALC, TRIG, CHOLHDL, LDLDIRECT in the last 72 hours. Thyroid Function Tests: No results for input(s): TSH, T4TOTAL, FREET4, T3FREE, THYROIDAB in the last 72 hours. Anemia Panel: No results for input(s): VITAMINB12, FOLATE, FERRITIN, TIBC, IRON, RETICCTPCT in the last 72 hours. Sepsis Labs: No results for input(s): PROCALCITON, LATICACIDVEN in the last 168 hours.  Recent Results  (from the past 240 hour(s))  Culture, Urine     Status: None   Collection Time: 11/01/20  8:05 PM   Specimen: Urine, Random  Result Value Ref Range Status   Specimen Description URINE, RANDOM  Final   Special Requests NONE  Final   Culture   Final    NO GROWTH Performed at [facility], [address] [place], [place] [address]    Report Status 11/03/2020 FINAL  Final  Resp Panel by RT-PCR (Flu A&B, Covid) Nasopharyngeal Swab     Status: None   Collection Time: 11/01/20  8:24 PM   Specimen: Nasopharyngeal Swab; Nasopharyngeal(NP) swabs in vial transport medium  Result Value Ref Range Status   SARS Coronavirus 2 by RT PCR NEGATIVE NEGATIVE Final    Comment: (NOTE) SARS-CoV-2 target nucleic acids are NOT DETECTED.  The SARS-CoV-2 RNA is generally detectable in upper respiratory specimens during the acute phase of infection. The lowest concentration of SARS-CoV-2 viral copies this assay can detect is 138 copies/mL. A negative result does not preclude SARS-Cov-2 infection and should not be used as the sole basis for treatment or other patient management decisions. A negative result may occur with  improper specimen collection/handling, submission of specimen other than nasopharyngeal swab, presence of viral mutation(s) within the areas targeted by this assay, and inadequate number of viral copies(<138 copies/mL). A negative result must be combined with clinical observations, patient history, and epidemiological information. The expected result is Negative.  Fact Sheet for Patients:  EntrepreneurPulse.com.au  Fact Sheet for Healthcare Providers:  IncredibleEmployment.be  This test is no t yet approved or cleared by the [place] FDA and  has been authorized for detection and/or diagnosis of SARS-CoV-2 by FDA under an Emergency Use Authorization (EUA). This EUA will remain  in effect (meaning this test can be used) for the duration of  the COVID-19 declaration under Section 564(b)(1) of the Act, 21 U.S.C.section 360bbb-3(b)(1), unless the authorization is terminated  or revoked sooner.       Influenza A by PCR NEGATIVE NEGATIVE Final   Influenza B by PCR NEGATIVE NEGATIVE Final    Comment: (NOTE) The Xpert Xpress SARS-CoV-2/FLU/RSV plus assay is intended as an aid in the diagnosis of influenza from Nasopharyngeal swab specimens and should not be used as a sole basis for treatment. Nasal washings and aspirates are  unacceptable for Xpert Xpress SARS-CoV-2/FLU/RSV testing.  Fact Sheet for Patients: EntrepreneurPulse.com.au  Fact Sheet for Healthcare Providers: IncredibleEmployment.be  This test is not yet approved or cleared by the [place] FDA and has been authorized for detection and/or diagnosis of SARS-CoV-2 by FDA under an Emergency Use Authorization (EUA). This EUA will remain in effect (meaning this test can be used) for the duration of the COVID-19 declaration under Section 564(b)(1) of the Act, 21 U.S.C. section 360bbb-3(b)(1), unless the authorization is terminated or revoked.  Performed at [facility], [place] [address] [place], [place] [address]          Radiology Studies: No results found.      Scheduled Meds: . amLODipine  5 mg Oral Daily  . calcium acetate (Phos Binder)  1,334 mg Oral TID WC  . Chlorhexidine Gluconate Cloth  6 each Topical Daily  . COVID-19 mRNA vaccine (Moderna)  0.5 mL Intramuscular Once  . feeding supplement  1 Container Oral TID BM  . finasteride  5 mg Oral Daily  . heparin  5,000 Units Subcutaneous Q8H  . ondansetron  4 mg Oral Once  . senna-docusate  1 tablet Oral QHS  . tamsulosin  0.4 mg Oral QPC supper   Continuous Infusions:   LOS: 5 days    Time spent: 41 minutes spent on chart review, discussion with nursing staff, consultants, updating family and interview/physical exam; more than 50% of that time  was spent in counseling and/or coordination of care.    Fabyan Loughmiller J [place] DO [facility] Available via Epic secure chat 7am-7pm After these hours, please refer to coverage provider listed on [email or web address] 11/06/2020, 1:36 PM

## 2020-11-06 NOTE — Progress Notes (Signed)
Date and time results received: 11/06/20 7:41 AM  (use smartphrase ".now" to insert current time)  Test: Calcium Critical Value: 6.4  Name of Provider Notified: [place] Eric MD  Orders Received? Or Actions Taken?: Awaiting response.

## 2020-11-06 NOTE — Plan of Care (Signed)

## 2020-11-06 NOTE — Progress Notes (Signed)
Patient ID: Isaish Alemu, male   DOB: 1965-09-19, 55 y.o.   MRN: [id number] S: No events overnight. O:BP 115/81 (BP Location: Left Arm)   Pulse 99   Temp 99.1 F (37.3 C) (Oral)   Resp 18   Ht 6\' 1"  (1.854 m)   Wt 84.3 kg   SpO2 100%   BMI 24.53 kg/m   Intake/Output Summary (Last 24 hours) at 11/06/2020 1526 Last data filed at 11/06/2020 1400 Gross per 24 hour  Intake 1120 ml  Output 1 ml  Net 1119 ml   Intake/Output: I/O last 3 completed shifts: In: 0630 [P.O.:240; I.V.:140; Blood:740; IV Piggyback:50] Out: 1901 [Urine:1900; Stool:1]  Intake/Output this shift:  Total I/O In: 240 [P.O.:240] Out: -  Weight change: 2.812 kg Gen: NAD CVS: RRR Resp: CTA Abd: benign Ext: no edema  Recent Labs  Lab 11/01/20 1523 11/01/20 1807 11/01/20 1940 11/02/20 0618 11/03/20 0318 11/03/20 1440 11/04/20 0246 11/05/20 0720 11/06/20 0540  NA 130*  --   --  133* 134* 136 136 134* 133*  K 4.7  --   --  3.3* 2.7* 3.3* 3.6 3.4* 3.4*  CL 96*  --   --  95* 91* 91* 98 99 97*  CO2 <7*  --   --  9* 23 26 25 22  21*  GLUCOSE 132*  --   --  135* 153* 121* 114* 129* 123*  BUN 239*  --   --  231* 145* 137* 75* 85* 94*  CREATININE 33.39*  --  32.20* 33.43* 19.53* 19.54* 12.57* 13.11* 12.96*  ALBUMIN  --  4.2  --   --  3.3* 3.3*  --   --   --   CALCIUM 6.6*  --   --  6.3* 6.0* 5.8* 6.5* 6.3* 6.4*  PHOS  --   --   --  >30.0* 6.2* 7.1*  --   --   --   AST  --  11*  --   --   --   --   --   --   --   ALT  --  11  --   --   --   --   --   --   --    Liver Function Tests: Recent Labs  Lab 11/01/20 1807 11/03/20 0318 11/03/20 1440  AST 11*  --   --   ALT 11  --   --   ALKPHOS 50  --   --   BILITOT 0.7  --   --   PROT 8.0  --   --   ALBUMIN 4.2 3.3* 3.3*   No results for input(s): LIPASE, AMYLASE in the last 168 hours. No results for input(s): AMMONIA in the last 168 hours. CBC: Recent Labs  Lab 11/01/20 1523 11/01/20 1940 11/02/20 0618 11/03/20 0318 11/03/20 1439 11/04/20 0246  11/05/20 0720 11/06/20 0540  WBC 8.4   < > 8.5 7.8 8.3 7.7  --  9.3  NEUTROABS 7.5  --   --   --   --   --   --  7.6  HGB 5.8*   < > 7.2* 7.1* 7.0* 7.2* 7.0* 7.7*  HCT 18.2*   < > 20.6* 19.7* 19.9* 21.1* 21.3* 23.2*  MCV 84.3   < > 79.8* 77.3* 79.6* 81.8  --  85.0  PLT 207   < > 172 161 155 146*  --  161   < > = values in this interval not displayed.   Cardiac Enzymes:  No results for input(s): CKTOTAL, CKMB, CKMBINDEX, TROPONINI in the last 168 hours. CBG: No results for input(s): GLUCAP in the last 168 hours.  Iron Studies: No results for input(s): IRON, TIBC, TRANSFERRIN, FERRITIN in the last 72 hours. Studies/Results: No results found. Marland Kitchen amLODipine  5 mg Oral Daily  . calcium acetate (Phos Binder)  1,334 mg Oral TID WC  . Chlorhexidine Gluconate Cloth  6 each Topical Daily  . COVID-19 mRNA vaccine (Moderna)  0.5 mL Intramuscular Once  . feeding supplement  1 Container Oral TID BM  . finasteride  5 mg Oral Daily  . heparin  5,000 Units Subcutaneous Q8H  . ondansetron  4 mg Oral Once  . senna-docusate  1 tablet Oral QHS  . tamsulosin  0.4 mg Oral QPC supper    BMET    Component Value Date/Time   NA 133 (L) 11/06/2020 0540   K 3.4 (L) 11/06/2020 0540   CL 97 (L) 11/06/2020 0540   CO2 21 (L) 11/06/2020 0540   GLUCOSE 123 (H) 11/06/2020 0540   BUN 94 (H) 11/06/2020 0540   CREATININE 12.96 (H) 11/06/2020 0540   CALCIUM 6.4 (LL) 11/06/2020 0540   GFRNONAA 4 (L) 11/06/2020 0540   GFRAA 25 (L) 08/15/2018 0728   CBC    Component Value Date/Time   WBC 9.3 11/06/2020 0540   RBC 2.73 (L) 11/06/2020 0540   HGB 7.7 (L) 11/06/2020 0540   HCT 23.2 (L) 11/06/2020 0540   PLT 161 11/06/2020 0540   MCV 85.0 11/06/2020 0540   MCH 28.2 11/06/2020 0540   MCHC 33.2 11/06/2020 0540   RDW 12.8 11/06/2020 0540   LYMPHSABS 0.4 (L) 11/06/2020 0540   MONOABS 1.0 11/06/2020 0540   EOSABS 0.1 11/06/2020 0540   BASOSABS 0.0 11/06/2020 0540    Assessment/Plan:  1. Non-oliguric,  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  Renal US with marked bilateral hydronephrosis.  Significant improvement with Foley catheter, but still with significantly elevated BUN/Cr and s/p HD x 2.  Last HD session 11/03/20.   1. Scr stable since last HD but BUN slowly climbing.   2. He is without uremic symptoms and will recheck labs tomorrow and decide about ongoing HD. 3. He will need VVS consult for AVF/AVG regardless given his advanced CKD 4. Good UOP without foley.   2. Urinary obstruction - bilateral hydronephrosis, present since 2020 but progressed since then.  Likely chronic.  Foley catheter removed today and will need to follow PVR.  He may benefit from urology evaluation if remains an issue.  Currently on finasteride and tamsulosin. 1.  Will need to check bladder scan in am.  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  Receiving IV calcium and started on calcium acetate binders qac.  PTH elevated at 406 and will start calcitriol.  4. Anemia of CKD stage IV-V:  S/p blood transfusion and now on ESA. 5. Vascular access:  Had RIJ [place] placed 11/02/20 and will need Vascular surgery evaluation for placement of AVF/AVG given advanced CKD stage IV dating back to 2020. 6. Hypertensive urgency:  Improved BP with meds, had not taken any for the past 2 years.    Donetta Potts, MD [employer] [phone]

## 2020-11-07 ENCOUNTER — Inpatient Hospital Stay (HOSPITAL_COMMUNITY): Payer: BC Managed Care – PPO

## 2020-11-07 DIAGNOSIS — N186 End stage renal disease: Secondary | ICD-10-CM

## 2020-11-07 DIAGNOSIS — E440 Moderate protein-calorie malnutrition: Secondary | ICD-10-CM

## 2020-11-07 LAB — CBC
HCT: 24 % — ABNORMAL LOW (ref 39.0–52.0)
Hemoglobin: 8 g/dL — ABNORMAL LOW (ref 13.0–17.0)
MCH: 28.2 pg (ref 26.0–34.0)
MCHC: 33.3 g/dL (ref 30.0–36.0)
MCV: 84.5 fL (ref 80.0–100.0)
Platelets: 195 10*3/uL (ref 150–400)
RBC: 2.84 MIL/uL — ABNORMAL LOW (ref 4.22–5.81)
RDW: 12.8 % (ref 11.5–15.5)
WBC: 10.9 10*3/uL — ABNORMAL HIGH (ref 4.0–10.5)
nRBC: 0 % (ref 0.0–0.2)

## 2020-11-07 LAB — RENAL FUNCTION PANEL
Albumin: 3.1 g/dL — ABNORMAL LOW (ref 3.5–5.0)
Anion gap: 15 (ref 5–15)
BUN: 95 mg/dL — ABNORMAL HIGH (ref 6–20)
CO2: 19 mmol/L — ABNORMAL LOW (ref 22–32)
Calcium: 6.6 mg/dL — ABNORMAL LOW (ref 8.9–10.3)
Chloride: 99 mmol/L (ref 98–111)
Creatinine, Ser: 12.95 mg/dL — ABNORMAL HIGH (ref 0.61–1.24)
GFR, Estimated: 4 mL/min — ABNORMAL LOW (ref >60)
Glucose, Bld: 108 mg/dL — ABNORMAL HIGH (ref 70–99)
Phosphorus: 4.4 mg/dL (ref 2.5–4.6)
Potassium: 4 mmol/L (ref 3.5–5.1)
Sodium: 133 mmol/L — ABNORMAL LOW (ref 135–145)

## 2020-11-07 LAB — MAGNESIUM: Magnesium: 1.5 mg/dL — ABNORMAL LOW (ref 1.7–2.4)

## 2020-11-07 MED ORDER — HEPARIN SODIUM (PORCINE) 1000 UNIT/ML IJ SOLN: 1000.0000 [IU] | INTRAMUSCULAR | Status: DC | PRN

## 2020-11-07 MED ORDER — HEPARIN SODIUM (PORCINE) 1000 UNIT/ML IJ SOLN
INTRAMUSCULAR | Status: AC
  Administered 2020-11-07: 3800 [IU] via INTRAVENOUS
  Filled 2020-11-07: qty 4

## 2020-11-07 NOTE — Progress Notes (Signed)
Nutrition Follow-up  DOCUMENTATION CODES:   Non-severe (moderate) malnutrition in context of chronic illness  INTERVENTION:    Continue Boost Breeze po TID, each supplement provides 250 kcal and 9 grams of protein  NUTRITION DIAGNOSIS:   Moderate Malnutrition related to chronic illness (CKD) as evidenced by mild muscle depletion,mild fat depletion.  Ongoing  GOAL:   Patient will meet greater than or equal to 90% of their needs   Progressing  MONITOR:   PO intake,Supplement acceptance,Labs,Skin  REASON FOR ASSESSMENT:   Malnutrition Screening Tool    ASSESSMENT:   55 yo male admitted with severe AKI, suspected progression of CKD with serum creatinine of 33 and phosphorus > 30. PMH includes HTN, BPH, urinary retention.   Plans for placement of of AVF/AVG for advanced CKD. Nephrology evaluating whether or not patient will need chronic HD at this time. S/P last HD on 6/4. UOP has improved. Urinary catheter has been removed.  Patient reports 64 lb weight loss within the past 2 years.  Recent poor appetite and poor intake at home. Since admission, his appetite has improved and he says he is eating 100% of all meals. Currently on a regular diet. He is also drinking Boost Breeze supplement between meals 2-3 times per day.  Labs reviewed. Na 133, BUN 95, Creat 12.95, phos 4.4 WNL, mag 1.5  Medications reviewed and include calcitriol, calcium acetate (Phoslyra), Aranesp, Senokot-S, Flomax.  Patient with mild depletion of muscle and subcutaneous fat mass meeting criteria for moderate malnutrition likely related to progression of CKD.  NUTRITION - FOCUSED PHYSICAL EXAM:  Flowsheet Row Most Recent Value  Orbital Region Mild depletion  Upper Arm Region Mild depletion  Thoracic and Lumbar Region Mild depletion  Buccal Region No depletion  Temple Region Mild depletion  Clavicle Bone Region Mild depletion  Clavicle and Acromion Bone Region Mild depletion  Scapular Bone  Region Mild depletion  Dorsal Hand Mild depletion  Patellar Region Unable to assess  Anterior Thigh Region Unable to assess  Posterior Calf Region Mild depletion  Edema (RD Assessment) None  Hair Reviewed  Eyes Reviewed  Mouth Reviewed  Skin Reviewed  Nails Reviewed       Diet Order:   Diet Order            Diet regular Room service appropriate? Yes; Fluid consistency: Thin  Diet effective now                 EDUCATION NEEDS:   Not appropriate for education at this time  Skin:  Skin Assessment: Reviewed RN Assessment  Last BM:  6/7  Height:   Ht Readings from Last 1 Encounters:  11/01/20 6\' 1"  (1.854 m)    Weight:   Wt Readings from Last 1 Encounters:  11/07/20 84.7 kg    Ideal Body Weight:  83.6 kg  BMI:  Body mass index is 24.63 kg/m.  Estimated Nutritional Needs:   Kcal:  2993-7169  Protein:  110-125 gm  Fluid:  >/= 2.2 L    Lucas Mallow, RD, LDN, CNSC Please refer to Amion for contact information.

## 2020-11-07 NOTE — Consult Note (Addendum)
Hospital Consult    Reason for Consult:  dialysis access Requesting Physician:  Marval Regal  MRN #:  [id number]  History of Present Illness: This is a 55 y.o. 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  He is now in need of dialysis access.  He did have a tunneled dialysis catheter placed by IR on 11/02/2020.  He presented to the ED about a week ago and was found to have a BUN/Cr of 33.4/240 respectively.  His hgb was <6.  Renal u/s showed significant bilateral hydronephrosis and had worsened since 2020.   Pt is right hand dominant.    The pt is not on a statin for cholesterol management.  The pt is not on a daily aspirin.   Other AC:  none The pt is on CCB for hypertension.   The pt is not diabetic.   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 IR FLUORO GUIDE CV LINE RIGHT  11/02/2020  . IR US GUIDE VASC ACCESS RIGHT  11/02/2020    No Known Allergies  Prior to Admission medications   Medication Sig Start Date End Date Taking? Authorizing Provider  amLODipine (NORVASC) 5 MG tablet Take 1 tablet (5 mg total) by mouth daily. Patient not taking: Reported on 11/01/2020 08/09/18   Loura Halt A, NP  finasteride (PROSCAR) 5 MG tablet Take 1 tablet (5 mg total) by mouth daily. Patient not taking: Reported on 11/01/2020 07/23/18   Raylene Everts, MD  ondansetron (ZOFRAN) 4 MG tablet Take 1 tablet (4 mg total) by mouth every 6 (six) hours as needed for nausea. Patient not taking: Reported on 11/01/2020 08/15/18   Aline August, MD  silodosin (RAPAFLO) 4 MG CAPS capsule Take 1 capsule (4 mg total) by mouth daily with breakfast. Patient not taking: Reported on 11/01/2020 08/16/18   Aline August, MD    Social History   Socioeconomic History  . Marital status: Single     Spouse name: Not on file  . Number of children: Not on file  . Years of education: Not on file  . Highest education level: Not on file  Occupational History  . Not on file  Tobacco Use  . Smoking status: Never Smoker  . Smokeless tobacco: Never Used  Substance and Sexual Activity  . Alcohol use: Never  . Drug use: Never  . Sexual activity: Not on file  Other Topics Concern  . Not on file  Social History Narrative  . Not on file   Social Determinants of Health   Financial Resource Strain: Not on file  Food Insecurity: Not on file  Transportation Needs: Not on file  Physical Activity: Not on file  Stress: Not on file  Social Connections: Not on file  Intimate Partner Violence: Not on file    Family Hx:   No family hx of renal disease or AAA  ROS: [x]  Positive   [ ]  Negative   [ ]  All sytems reviewed and are negative  Cardiac: []  chest pain/pressure []  dyspnea on exertion  Vascular: []  pain in legs while walking []  non-healing ulcers []  hx of DVT []  swelling in legs  Pulmonary: []  asthma/wheezing []  home O2  Neurologic: []  hx of CVA []  mini stroke   Hematologic: []  hx of cancer  Endocrine:   []  diabetes []  thyroid disease  GI []  GERD  GU: [x]  CKD/renal failure [x]  HD--[]  M/W/F or []  T/T/S  Psychiatric: []  anxiety []  depression  Musculoskeletal: []  arthritis []  joint pain  Integumentary: []  rashes []  ulcers  Constitutional: []  fever  []  chills   Physical Examination  Vitals:   11/06/20 2130 11/07/20 0559  BP: 128/83 (!) 141/87  Pulse: 100   Resp: 18 18  Temp: 99.2 F (37.3 C) 98.3 F (36.8 C)  SpO2: 98% 96%   Body mass index is 24.63 kg/m.  General:  WDWN in NAD Gait: Not observed HENT: WNL, normocephalic Pulmonary: normal non-labored breathing Cardiac: regular, without Murmur; without carotid bruits Abdomen:  soft, NT/ND; aortic pulse is not palpable Skin: without rashes Vascular Exam/Pulses:  Right Left  Radial 2+  (normal) 2+ (normal)  DP 2+ (normal) 2+ (normal)  PT Unable to palpate Unable to palpate   Extremities: without ischemic changes, without Gangrene , without cellulitis; without open wounds; IV present right forearm and left hand Musculoskeletal: no muscle wasting or atrophy  Neurologic: A&O X 3; speech is fluent/normal Psychiatric:  The pt has flat affect.   CBC    Component Value Date/Time   WBC 10.9 (H) 11/07/2020 0317   RBC 2.84 (L) 11/07/2020 0317   HGB 8.0 (L) 11/07/2020 0317   HCT 24.0 (L) 11/07/2020 0317   PLT 195 11/07/2020 0317   MCV 84.5 11/07/2020 0317   MCH 28.2 11/07/2020 0317   MCHC 33.3 11/07/2020 0317   RDW 12.8 11/07/2020 0317   LYMPHSABS 0.4 (L) 11/06/2020 0540   MONOABS 1.0 11/06/2020 0540   EOSABS 0.1 11/06/2020 0540   BASOSABS 0.0 11/06/2020 0540    BMET    Component Value Date/Time   NA 133 (L) 11/07/2020 0317   K 4.0 11/07/2020 0317   CL 99 11/07/2020 0317   CO2 19 (L) 11/07/2020 0317   GLUCOSE 108 (H) 11/07/2020 0317   BUN 95 (H) 11/07/2020 0317   CREATININE 12.95 (H) 11/07/2020 0317   CALCIUM 6.6 (L) 11/07/2020 0317   GFRNONAA 4 (L) 11/07/2020 0317   GFRAA 25 (L) 08/15/2018 0728    COAGS: No results found for: INR, PROTIME   Non-Invasive Vascular Imaging:   BUE Vein Mapping ordered   ASSESSMENT/PLAN: This is a 55 y.o. male with AKI on CKD in need of permanent dialysis access.  He has TDC right IJ that was placed by IR on 11/02/2020.    -pt is right hand dominant-will restrict left arm and ask nursing to remove IV from left hand.  -discussed fistula vs graft and steal sx with pt.  -will plan for access once VM is complete.   -Dr. Stanford Breed to see pt later today.   Leontine Locket, PA-C [facility] [phone]  I have examined the patient, reviewed and agree with above.  Curt Jews, MD 11/07/2020 2:30 PM

## 2020-11-07 NOTE — Consult Note (Addendum)
[facility]    HPI: Daniel Mccarty is a 55 y.o. male seen for evaluation for hemodialysis access.  He is a very pleasant 55 year old gentleman.  He has progressed to end-stage renal disease related to hypertension and BPH.  He had a hemodialysis catheter placed by interventional radiology on 11/02/2020 and has had successful dialysis with this.  He is right-handed.  He does not have a pacemaker or other central venous implants.  History reviewed. No pertinent past medical history.  History reviewed. No pertinent family history.  SOCIAL HISTORY: Social History   Tobacco Use  . Smoking status: Never Smoker  . Smokeless tobacco: Never Used  Substance Use Topics  . Alcohol use: Never    No Known Allergies  Current Facility-Administered Medications  Medication Dose Route Frequency Provider Last Rate Last Admin  . acetaminophen (TYLENOL) tablet 650 mg  650 mg Oral Q6H PRN Irene Pap N, DO      . amLODipine (NORVASC) tablet 5 mg  5 mg Oral Daily Hosie Poisson, MD   5 mg at 11/06/20 0857  . calcitRIOL (ROCALTROL) capsule 0.25 mcg  0.25 mcg Oral Daily Donato Heinz, MD   0.25 mcg at 11/06/20 1725  . calcium acetate (Phos Binder) (PHOSLYRA) 667 MG/5ML oral solution 1,334 mg  1,334 mg Oral TID WC Hosie Poisson, MD   1,334 mg at 11/07/20 0837  . Chlorhexidine Gluconate Cloth 2 % PADS 6 each  6 each Topical Daily Kayleen Memos, DO   6 each at 11/07/20 1345  . COVID-19 mRNA vaccine (Moderna) injection 0.5 mL  0.5 mL Intramuscular Once Hosie Poisson, MD      . Darbepoetin Alfa (ARANESP) injection 60 mcg  60 mcg Subcutaneous Q Tue-1800 Donato Heinz, MD   60 mcg at 11/06/20 2153  . feeding supplement (BOOST / RESOURCE BREEZE) liquid 1 Container  1 Container Oral TID BM Hosie Poisson, MD   1 Container at 11/07/20 1344  . finasteride (PROSCAR) tablet 5 mg  5 mg Oral Daily Irene Pap N, DO   5 mg at 11/06/20 0857  . heparin injection  5,000 Units  5,000 Units Subcutaneous Q8H Lyndee Leo, RPH   5,000 Units at 11/07/20 0636  . labetalol (NORMODYNE) injection 5 mg  5 mg Intravenous Q2H PRN Irene Pap N, DO      . melatonin tablet 3 mg  3 mg Oral QHS PRN Irene Pap N, DO      . ondansetron (ZOFRAN-ODT) disintegrating tablet 4 mg  4 mg Oral Once Lynnda Child, PA-C      . prochlorperazine (COMPAZINE) injection 5 mg  5 mg Intravenous Q6H PRN Irene Pap N, DO   5 mg at 11/03/20 6237  . senna-docusate (Senokot-S) tablet 1 tablet  1 tablet Oral QHS Irene Pap N, DO   1 tablet at 11/06/20 2153  . tamsulosin (FLOMAX) capsule 0.4 mg  0.4 mg Oral QPC supper Irene Pap N, DO   0.4 mg at 11/06/20 1718    REVIEW OF SYSTEMS:  [X]  denotes positive finding, [ ]  denotes negative finding Cardiac  Comments:  Chest pain or chest pressure:    Shortness of breath upon exertion:    Short of breath when lying flat:    Irregular heart rhythm:        Vascular    Pain in calf, thigh, or hip brought on by ambulation:    Pain in feet at night that wakes you up from your sleep:  Blood clot in your veins:    Leg swelling:           PHYSICAL EXAM: Vitals:   11/07/20 0556 11/07/20 0559 11/07/20 1122 11/07/20 1300  BP:  (!) 141/87 120/79 113/78  Pulse:    92  Resp:  18 18 16   Temp:  98.3 F (36.8 C) 98.1 F (36.7 C)   TempSrc:  Oral Oral   SpO2:  96%    Weight: 84.7 kg     Height:        GENERAL: The patient is a well-nourished male, in no acute distress. The vital signs are documented above. CARDIOVASCULAR: 2+ radial pulses bilaterally.  Well-developed cephalic vein in the left forearm. PULMONARY: There is good air exchange  MUSCULOSKELETAL: There are no major deformities or cyanosis. NEUROLOGIC: No focal weakness or paresthesias are detected. SKIN: There are no ulcers or rashes noted. PSYCHIATRIC: The patient has a normal affect.  DATA:  Venous mapping of upper extremities noted.  Cephalic vein and basilic  vein in the 2 to 3 mm range bilaterally.  MEDICAL ISSUES: I had a long discussion with the patient regarding access for hemodialysis.  Discussed the use of tunneled catheter which she is currently using.  Also discussed AV graft and AV fistula.  Discussed the advantages and disadvantages of each of these.  Discussed potential nonmaturation of the fistula and the certain need for ongoing maintenance of any access.  His cephalic vein on the left forearm is very well-developed.  It is larger than would be anticipated based on his vein map.  I do feel that he would be a good candidate for left radiocephalic fistula as his initial fistula attempt.  We will coordinate this around his dialysis.  Plan for surgery on Friday, 11/09/2020    Rosetta Posner, MD FACS [facility] [phone]  Note: Portions of this report may have been transcribed using voice recognition software.  Every effort has been made to ensure accuracy; however, inadvertent computerized transcription errors may still be present.

## 2020-11-07 NOTE — Progress Notes (Signed)
Pt voided 200cc, bladder scan 600cc.  Pt denies discomfort or pressure.

## 2020-11-07 NOTE — Progress Notes (Signed)
Physical Therapy Treatment Patient Details Name: Daniel Mccarty MRN: [id number] DOB: 1966/04/28 Today's Date: 11/07/2020    History of Present Illness Patient is a 55 y.o. male admitted from PCP on 11/01/20 due to elevated BP; workup for hypertensive urgency, AKI on CKD IV, metabolic acidosis, severe anemia. S/p tunneled HD cath placement 11/02/20 and initation of HD. PMH includes HTN, BPH, urinary retention.   PT Comments    Pt progressing well with mobility. Pt independent with mobility, including ambulation and stair training, without DME, as well as ADL tasks. Encouraged more frequent activity while admitted. HR up to 130s with ambulation. Pt has met short-term acute PT goals; reports no further questions or concerns. Will d/c acute PT.    Follow Up Recommendations  No PT follow up     Equipment Recommendations  None recommended by PT    Recommendations for Other Services       Precautions / Restrictions Precautions Precautions: Other (comment) Precaution Comments: Watch HR Restrictions Weight Bearing Restrictions: No    Mobility  Bed Mobility               General bed mobility comments: received sitting in recliner    Transfers Overall transfer level: Independent Equipment used: None                Ambulation/Gait Ambulation/Gait assistance: Independent Gait Distance (Feet): 300 Feet Assistive device: None Gait Pattern/deviations: WFL(Within Functional Limits)   Gait velocity interpretation: >4.37 ft/sec, indicative of normal walking speed General Gait Details: Fast ambulation, independent without DME; no overt instability or LOB; HR up to 130s   Stairs Stairs: Yes Stairs assistance: Modified independent (Device/Increase time) Stair Management: One rail Right;Two rails;Alternating pattern;Forwards Number of Stairs: 12 General stair comments: Mod indep ascending/descending steps with rail support; no overt instability or LOB   Wheelchair Mobility     Modified Rankin (Stroke Patients Only)       Balance Overall balance assessment: No apparent balance deficits (not formally assessed)                                          Cognition Arousal/Alertness: Awake/alert Behavior During Therapy: Flat affect Overall Cognitive Status: Within Functional Limits for tasks assessed                                 General Comments: WFL for simple tasks; pt with flat affect, states, "I've got a lot on my mind"      Exercises      General Comments General comments (skin integrity, edema, etc.): Pt independent with mobility and ADL tasks; educ re: activity recommendations (more frequent out of bed activity), importance of mobility      Pertinent Vitals/Pain Pain Assessment: No/denies pain Pain Intervention(s): Monitored during session    Home Living                      Prior Function            PT Goals (current goals can now be found in the care plan section) Progress towards PT goals: Progressing toward goals    Frequency    Min 3X/week      PT Plan Current plan remains appropriate    Co-evaluation  AM-PAC PT "6 Clicks" Mobility   Outcome Measure  Help needed turning from your back to your side while in a flat bed without using bedrails?: None Help needed moving from lying on your back to sitting on the side of a flat bed without using bedrails?: None Help needed moving to and from a bed to a chair (including a wheelchair)?: None Help needed standing up from a chair using your arms (e.g., wheelchair or bedside chair)?: None Help needed to walk in hospital room?: None Help needed climbing 3-5 steps with a railing? : None 6 Click Score: 24    End of Session   Activity Tolerance: Patient tolerated treatment well Patient left: in chair;with call bell/phone within reach Nurse Communication: Mobility status PT Visit Diagnosis: Unsteadiness on feet  (R26.81);Difficulty in walking, not elsewhere classified (R26.2)     Time: 7496-6466 PT Time Calculation (min) (ACUTE ONLY): 9 min  Charges:  $Gait Training: 8-22 mins                     Mabeline Caras, PT, DPT Acute Rehabilitation Services  Pager [phone] Office [place] 11/07/2020, 10:42 AM

## 2020-11-07 NOTE — Progress Notes (Signed)
PROGRESS NOTE    [name]  VCB:[id number] DOB: April 06, 1966 DOA: 11/01/2020 PCP: Patient, No Pcp Per (Inactive)    Brief Narrative:  Daniel Mccarty is a 55 year old male with past medical history significant for essential hypertension, CKD stage IV, urinary retention, BPH who presented to [facility] ED on 6/2 at the recommendation of his PCP due to hypertensive urgency with SBP in the 200s.  Patient has not seen a provider for more than 2 years and has been off his antihypertensives for the last 2 years since he has had no refills.  Patient reports progressive fatigue.  In the ED, patient was noted to have a BUN of 239 with a creatinine of 33, serum bicarbonate less than 7, hemoglobin 5.8, platelets 207.  Bicarb drip.  Nephrology was consulted.  TRH consulted for further evaluation and management of hypertensive urgency, and progression of renal failure.   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  Found to have elevated BUN of 239 with a creatinine of 33.  SBP in the 200s on ED arrival.  Patient has been off antihypertensive treatment for the last 2 years due to noncompliance with outpatient visits.  Renal ultrasound with bilateral hydronephrosis with distended urinary bladder with associated renal cortical thinning and increased parenchymal echogenicity suggesting chronic cause. --Nephrology/vascular surgery following, appreciate assistance --Cr 33>>>12.96>12.95 --Work-up today by vascular surgery for AV graft vs AV fistula placement --Continue HD per nephrology  Hypertensive urgency Patient presenting with SBP in the 200s.  Has been  off of antihypertensives for 2 years due to noncompliance with medical therapy. BP 141/87 this morning, well controlled --Amlodipine 5 mg p.o. daily  Obstructive uropathy BPH Ultrasound on 6/2 with marked bilateral hydronephrosis with renal cortical thinning and increased parenchymal echogenicity suggesting chronic, distended urinary bladder and mild bladder wall thickening.  Etiology likely secondary to chronic BPH causing obstruction and now superimposed medical renal disease.  Foley catheter initially placed which was discontinued on 11/05/2020; now replaced 6/8 due to recurrent high PVRs. --Tamsulosin 0.4 mg p.o. daily --Finasteride 5 mg p.o. daily --Bladder scan this morning with PVR of 600 mL and 940mL; so we will replace Foley catheter today.  Anemia of chronic renal disease Hemoglobin 5.8 on admission.  Transfuse 2 unit PRBC on 6/2 and 1 unit PRBC on 6/6.  Iron 123, TIBC 206, ferritin 374, folate 7.1, vitamin B12 1063. --Hgb 5.8>>>7.7>8.0 --Transfuse for hemoglobin less than 7.0  Hypocalcemia Received 2 g calcium gluconate on 6/2 and 1 g calcium gluconate on 6/6. --Calcium 6.6 with albumin 3.1, corrected for hypoalbuminemia calcium 7.3.  Moderate protein calorie malnutrition: Body mass index is 24.63 kg/m. Nutrition Status: Nutrition Problem: Moderate Malnutrition Etiology: chronic illness (CKD) Signs/Symptoms: mild muscle depletion,mild fat depletion Interventions: Boost Breeze  --Dietitian following, appreciate assistance --Continue to encourage increased oral intake, supplements     DVT prophylaxis: heparin injection 5,000 Units Start: 11/01/20 2200 SCDs Start: 11/01/20 1941    Code Status: Full Code Family Communication: None present at bedside this morning  Disposition Plan:  Level of care: Progressive Status is: Inpatient  Remains inpatient appropriate because:Ongoing diagnostic testing needed not appropriate  for outpatient work up, Unsafe d/c plan, IV  treatments appropriate due to intensity of illness or inability to take PO and Inpatient level of care appropriate due to severity of illness   Dispo:  Patient From: Home  Planned Disposition: Home  Medically stable for discharge: No       Consultants:   Nephrology  Vascular surgery  Interventional radiology  Procedures:   Catheter placement, IR 6/3  Antimicrobials:   None   Subjective: Patient seen and examined at bedside, resting comfortably.  Feels like he voids his entire bladder but recurrent retention noted on bladder scan with 600 mL earlier this morning followed by 940 mL.  Discussed with patient need for replacement of Foley catheter today.  No other complaints or concerns at this time.  Denies headache, no dizziness, no fever/chills/night sweats, no nausea/vomiting/diarrhea, no chest pain, no palpitations, no shortness of breath, no abdominal pain, no weakness, no fatigue, no paresthesias.  No acute events overnight per nursing staff.  Objective: Vitals:   11/06/20 2130 11/07/20 0556 11/07/20 0559 11/07/20 1122  BP: 128/83  (!) 141/87 120/79  Pulse: 100     Resp: 18  18 18   Temp: 99.2 F (37.3 C)  98.3 F (36.8 C) 98.1 F (36.7 C)  TempSrc: Oral  Oral Oral  SpO2: 98%  96%   Weight:  84.7 kg    Height:        Intake/Output Summary (Last 24 hours) at 11/07/2020 1353 Last data filed at 11/07/2020 0559 Gross per 24 hour  Intake 240 ml  Output 200 ml  Net 40 ml   Filed Weights   11/05/20 0700 11/06/20 0639 11/07/20 0556  Weight: 81.5 kg 84.3 kg 84.7 kg    Examination:  General exam: Appears calm and comfortable  Respiratory system: Clear to auscultation. Respiratory effort normal. Cardiovascular system: S1 & S2 heard, RRR. No JVD, murmurs, rubs, gallops or clicks. No pedal edema.  Tunneled HD catheter noted right chest. Gastrointestinal system: Abdomen is nondistended, soft and nontender. No organomegaly or masses felt. Normal bowel sounds  heard. Central nervous system: Alert and oriented. No focal neurological deficits. Extremities: Symmetric 5 x 5 power. Skin: No rashes, lesions or ulcers Psychiatry: Judgement and insight appear normal. Mood & affect appropriate.     Data Reviewed: I have personally reviewed following labs and imaging studies  CBC: Recent Labs  Lab 11/01/20 1523 11/01/20 1940 11/03/20 0318 11/03/20 1439 11/04/20 0246 11/05/20 0720 11/06/20 0540 11/07/20 0317  WBC 8.4   < > 7.8 8.3 7.7  --  9.3 10.9*  NEUTROABS 7.5  --   --   --   --   --  7.6  --   HGB 5.8*   < > 7.1* 7.0* 7.2* 7.0* 7.7* 8.0*  HCT 18.2*   < > 19.7* 19.9* 21.1* 21.3* 23.2* 24.0*  MCV 84.3   < > 77.3* 79.6* 81.8  --  85.0 84.5  PLT 207   < > 161 155 146*  --  161 195   < > = values in this interval not displayed.   Basic Metabolic Panel: Recent Labs  Lab 11/02/20 0618 11/03/20 0318 11/03/20 1440 11/04/20 0246 11/05/20 0720 11/06/20 0540 11/07/20 0317  NA 133* 134* 136 136 134* 133* 133*  K 3.3* 2.7* 3.3* 3.6 3.4* 3.4* 4.0  CL 95* 91* 91* 98 99 97* 99  CO2 9* 23 26 25 22  21* 19*  GLUCOSE 135* 153* 121* 114* 129* 123* 108*  BUN  231* 145* 137* 75* 85* 94* 95*  CREATININE 33.43* 19.53* 19.54* 12.57* 13.11* 12.96* 12.95*  CALCIUM 6.3* 6.0* 5.8* 6.5* 6.3* 6.4* 6.6*  MG 1.6*  --   --   --   --   --  1.5*  PHOS >30.0* 6.2* 7.1*  --   --   --  4.4   GFR: Estimated Creatinine Clearance: 7.4 mL/min (A) (by C-G formula based on SCr of 12.95 mg/dL (H)). Liver Function Tests: Recent Labs  Lab 11/01/20 1807 11/03/20 0318 11/03/20 1440 11/07/20 0317  AST 11*  --   --   --   ALT 11  --   --   --   ALKPHOS 50  --   --   --   BILITOT 0.7  --   --   --   PROT 8.0  --   --   --   ALBUMIN 4.2 3.3* 3.3* 3.1*   No results for input(s): LIPASE, AMYLASE in the last 168 hours. No results for input(s): AMMONIA in the last 168 hours. Coagulation Profile: No results for input(s): INR, PROTIME in the last 168 hours. Cardiac  Enzymes: No results for input(s): CKTOTAL, CKMB, CKMBINDEX, TROPONINI in the last 168 hours. BNP (last 3 results) No results for input(s): PROBNP in the last 8760 hours. HbA1C: No results for input(s): HGBA1C in the last 72 hours. CBG: No results for input(s): GLUCAP in the last 168 hours. Lipid Profile: No results for input(s): CHOL, HDL, LDLCALC, TRIG, CHOLHDL, LDLDIRECT in the last 72 hours. Thyroid Function Tests: No results for input(s): TSH, T4TOTAL, FREET4, T3FREE, THYROIDAB in the last 72 hours. Anemia Panel: No results for input(s): VITAMINB12, FOLATE, FERRITIN, TIBC, IRON, RETICCTPCT in the last 72 hours. Sepsis Labs: No results for input(s): PROCALCITON, LATICACIDVEN in the last 168 hours.  Recent Results (from the past 240 hour(s))  Culture, Urine     Status: None   Collection Time: 11/01/20  8:05 PM   Specimen: Urine, Random  Result Value Ref Range Status   Specimen Description URINE, RANDOM  Final   Special Requests NONE  Final   Culture   Final    NO GROWTH Performed at [facility], [address], [place], [place] [address]    Report Status 11/03/2020 FINAL  Final  Resp Panel by RT-PCR (Flu A&B, Covid) Nasopharyngeal Swab     Status: None   Collection Time: 11/01/20  8:24 PM   Specimen: Nasopharyngeal Swab; Nasopharyngeal(NP) swabs in vial transport medium  Result Value Ref Range Status   SARS Coronavirus 2 by RT PCR NEGATIVE NEGATIVE Final    Comment: (NOTE) SARS-CoV-2 target nucleic acids are NOT DETECTED.  The SARS-CoV-2 RNA is generally detectable in upper respiratory specimens during the acute phase of infection. The lowest concentration of SARS-CoV-2 viral copies this assay can detect is 138 copies/mL. A negative result does not preclude SARS-Cov-2 infection and should not be used as the sole basis for treatment or other patient management decisions. A negative result may occur with  improper specimen collection/handling, submission of specimen  other than nasopharyngeal swab, presence of viral mutation(s) within the areas targeted by this assay, and inadequate number of viral copies(<138 copies/mL). A negative result must be combined with clinical observations, patient history, and epidemiological information. The expected result is Negative.  Fact Sheet for Patients:  EntrepreneurPulse.com.au  Fact Sheet for Healthcare Providers:  IncredibleEmployment.be  This test is no t yet approved or cleared by the [place] and  has been authorized for detection and/or diagnosis of SARS-CoV-2 by FDA under an Emergency Use Authorization (EUA). This EUA will remain  in effect (meaning this test can be used) for the duration of the COVID-19 declaration under Section 564(b)(1) of the Act, 21 U.S.C.section 360bbb-3(b)(1), unless the authorization is terminated  or revoked sooner.       Influenza A by PCR NEGATIVE NEGATIVE Final   Influenza B by PCR NEGATIVE NEGATIVE Final    Comment: (NOTE) The Xpert Xpress SARS-CoV-2/FLU/RSV plus assay is intended as an aid in the diagnosis of influenza from Nasopharyngeal swab specimens and should not be used as a sole basis for treatment. Nasal washings and aspirates are unacceptable for Xpert Xpress SARS-CoV-2/FLU/RSV testing.  Fact Sheet for Patients: EntrepreneurPulse.com.au  Fact Sheet for Healthcare Providers: IncredibleEmployment.be  This test is not yet approved or cleared by the [place] FDA and has been authorized for detection and/or diagnosis of SARS-CoV-2 by FDA under an Emergency Use Authorization (EUA). This EUA will remain in effect (meaning this test can be used) for the duration of the COVID-19 declaration under Section 564(b)(1) of the Act, 21 U.S.C. section 360bbb-3(b)(1), unless the authorization is terminated or revoked.  Performed at [facility], [place] [address] [place],  [place] [address]          Radiology Studies: VAS [place] UPPER EXT VEIN MAPPING (PRE-OP AVF)  Result Date: 11/07/2020 [employer] MAPPING Patient Name:  AYUSH BOULET  Date of Exam:   11/07/2020 Medical Rec #: [id number]       Accession #:    [id number] Date of Birth: [birth year]       Patient Gender: M Patient Age:   55Y Exam Location:  [facility] Procedure:      VAS [place] UPPER EXT VEIN MAPPING (PRE-OP AVF) Referring Phys: [id number] Yevonne Aline HAWKEN --------------------------------------------------------------------------------  Indications: Pre-access. History: CKD.  Comparison Study: No prior study Performing Technologist: Maudry Mayhew MHA, RDMS, RVT, RDCS  Examination Guidelines: A complete evaluation includes B-mode imaging, spectral Doppler, color Doppler, and power Doppler as needed of all accessible portions of each vessel. Bilateral testing is considered an integral part of a complete examination. Limited examinations for reoccurring indications may be performed as noted. +-----------------+-------------+----------+-------------------+ Right Cephalic   Diameter (cm)Depth (cm)     Findings       +-----------------+-------------+----------+-------------------+ Shoulder             0.22        0.49                       +-----------------+-------------+----------+-------------------+ Prox upper arm       0.22        0.47                       +-----------------+-------------+----------+-------------------+ Mid upper arm        0.20        0.29                       +-----------------+-------------+----------+-------------------+ Dist upper arm       0.22        0.19                       +-----------------+-------------+----------+-------------------+ Antecubital fossa    0.24        0.31                       +-----------------+-------------+----------+-------------------+  Prox forearm         0.16        0.30                        +-----------------+-------------+----------+-------------------+ Mid forearm          0.13        0.26        branching      +-----------------+-------------+----------+-------------------+ Wrist                                   not visualized (IV) +-----------------+-------------+----------+-------------------+ +-----------------+-------------+----------+---------+ Right Basilic    Diameter (cm)Depth (cm)Findings  +-----------------+-------------+----------+---------+ Mid upper arm        0.37                         +-----------------+-------------+----------+---------+ Dist upper arm       0.46                         +-----------------+-------------+----------+---------+ Antecubital fossa    0.22               branching +-----------------+-------------+----------+---------+ Prox forearm         0.14                         +-----------------+-------------+----------+---------+ Mid forearm          0.13                         +-----------------+-------------+----------+---------+ Distal forearm       0.14                         +-----------------+-------------+----------+---------+ Wrist                0.13                         +-----------------+-------------+----------+---------+ +-----------------+-------------+----------+---------+ Left Cephalic    Diameter (cm)Depth (cm)Findings  +-----------------+-------------+----------+---------+ Shoulder             0.19        0.37             +-----------------+-------------+----------+---------+ Prox upper arm       0.19        0.47             +-----------------+-------------+----------+---------+ Mid upper arm        0.14        0.29             +-----------------+-------------+----------+---------+ Dist upper arm       0.13        0.32             +-----------------+-------------+----------+---------+ Antecubital fossa    0.18        0.30              +-----------------+-------------+----------+---------+ Prox forearm         0.16        0.23   branching +-----------------+-------------+----------+---------+ Mid forearm          0.17        0.20             +-----------------+-------------+----------+---------+ Dist forearm  0.12        0.19             +-----------------+-------------+----------+---------+ Wrist                0.06        0.22             +-----------------+-------------+----------+---------+ +-----------------+-------------+----------+---------+ Left Basilic     Diameter (cm)Depth (cm)Findings  +-----------------+-------------+----------+---------+ Prox upper arm       0.59                         +-----------------+-------------+----------+---------+ Mid upper arm        0.28               branching +-----------------+-------------+----------+---------+ Dist upper arm       0.31                         +-----------------+-------------+----------+---------+ Antecubital fossa    0.41                         +-----------------+-------------+----------+---------+ Prox forearm         0.18               branching +-----------------+-------------+----------+---------+ Mid forearm          0.11                         +-----------------+-------------+----------+---------+ Distal forearm       0.08                         +-----------------+-------------+----------+---------+ Wrist                0.11                         +-----------------+-------------+----------+---------+ *See table(s) above for measurements and observations.  Diagnosing physician:    Preliminary         Scheduled Meds: . amLODipine  5 mg Oral Daily  . calcitRIOL  0.25 mcg Oral Daily  . calcium acetate (Phos Binder)  1,334 mg Oral TID WC  . Chlorhexidine Gluconate Cloth  6 each Topical Daily  . COVID-19 mRNA vaccine (Moderna)  0.5 mL Intramuscular Once  . darbepoetin (ARANESP) injection -  NON-DIALYSIS  60 mcg Subcutaneous Q Tue-1800  . feeding supplement  1 Container Oral TID BM  . finasteride  5 mg Oral Daily  . heparin  5,000 Units Subcutaneous Q8H  . ondansetron  4 mg Oral Once  . senna-docusate  1 tablet Oral QHS  . tamsulosin  0.4 mg Oral QPC supper   Continuous Infusions:   LOS: 6 days    Time spent: 43 minutes spent on chart review, discussion with nursing staff, consultants, updating family and interview/physical exam; more than 50% of that time was spent in counseling and/or coordination of care.    Bennye Nix J [place] DO [facility] Available via Epic secure chat 7am-7pm After these hours, please refer to coverage provider listed on [email or web address] 11/07/2020, 1:53 PM

## 2020-11-07 NOTE — Progress Notes (Addendum)
Bilateral upper extremity vein mapping completed. Refer to "CV Proc" under chart review to view preliminary results.  11/07/2020 12:14 PM Kelby Aline., MHA, RVT, RDCS, RDMS

## 2020-11-07 NOTE — Progress Notes (Signed)
Patient ID: Daniel Mccarty, male   DOB: [birth year], 55 y.o.   MRN: [id number] S: no complaints.  Unfortunately his PVR was 600 cc's O:BP 120/79 (BP Location: Left Arm)   Pulse 100   Temp 98.1 F (36.7 C) (Oral)   Resp 18   Ht 6\' 1"  (1.854 m)   Wt 84.7 kg   SpO2 96%   BMI 24.63 kg/m   Intake/Output Summary (Last 24 hours) at 11/07/2020 1218 Last data filed at 11/07/2020 0559 Gross per 24 hour  Intake 240 ml  Output 200 ml  Net 40 ml   Intake/Output: I/O last 3 completed shifts: In: 1120 [P.O.:240; I.V.:140; Blood:740] Out: 200 [Urine:200]  Intake/Output this shift:  No intake/output data recorded. Weight change: 0.363 kg Gen:NAD CVS: tachy at 100 Resp: CTA Abd: benign, + fullness of bladder on palpation, nontender Ext: no edema  Recent Labs  Lab 11/01/20 1807 11/01/20 1940 11/02/20 0618 11/03/20 0318 11/03/20 1440 11/04/20 0246 11/05/20 0720 11/06/20 0540 11/07/20 0317  NA  --   --  133* 134* 136 136 134* 133* 133*  K  --   --  3.3* 2.7* 3.3* 3.6 3.4* 3.4* 4.0  CL  --   --  95* 91* 91* 98 99 97* 99  CO2  --   --  9* 23 26 25 22  21* 19*  GLUCOSE  --   --  135* 153* 121* 114* 129* 123* 108*  BUN  --   --  231* 145* 137* 75* 85* 94* 95*  CREATININE  --    < > 33.43* 19.53* 19.54* 12.57* 13.11* 12.96* 12.95*  ALBUMIN 4.2  --   --  3.3* 3.3*  --   --   --  3.1*  CALCIUM  --   --  6.3* 6.0* 5.8* 6.5* 6.3* 6.4* 6.6*  PHOS  --   --  >30.0* 6.2* 7.1*  --   --   --  4.4  AST 11*  --   --   --   --   --   --   --   --   ALT 11  --   --   --   --   --   --   --   --    < > = values in this interval not displayed.   Liver Function Tests: Recent Labs  Lab 11/01/20 1807 11/03/20 0318 11/03/20 1440 11/07/20 0317  AST 11*  --   --   --   ALT 11  --   --   --   ALKPHOS 50  --   --   --   BILITOT 0.7  --   --   --   PROT 8.0  --   --   --   ALBUMIN 4.2 3.3* 3.3* 3.1*   No results for input(s): LIPASE, AMYLASE in the last 168 hours. No results for input(s): AMMONIA in  the last 168 hours. CBC: Recent Labs  Lab 11/01/20 1523 11/01/20 1940 11/03/20 0318 11/03/20 1439 11/04/20 0246 11/05/20 0720 11/06/20 0540 11/07/20 0317  WBC 8.4   < > 7.8 8.3 7.7  --  9.3 10.9*  NEUTROABS 7.5  --   --   --   --   --  7.6  --   HGB 5.8*   < > 7.1* 7.0* 7.2* 7.0* 7.7* 8.0*  HCT 18.2*   < > 19.7* 19.9* 21.1* 21.3* 23.2* 24.0*  MCV 84.3   < > 77.3* 79.6* 81.8  --  85.0 84.5  PLT 207   < > 161 155 146*  --  161 195   < > = values in this interval not displayed.   Cardiac Enzymes: No results for input(s): CKTOTAL, CKMB, CKMBINDEX, TROPONINI in the last 168 hours. CBG: No results for input(s): GLUCAP in the last 168 hours.  Iron Studies: No results for input(s): IRON, TIBC, TRANSFERRIN, FERRITIN in the last 72 hours. Studies/Results: VAS [place] UPPER EXT VEIN MAPPING (PRE-OP AVF)  Result Date: 11/07/2020 [place] MAPPING Patient Name:  Daniel Mccarty  Date of Exam:   11/07/2020 Medical Rec #: [id number]       Accession #:    [id number] Date of Birth: 03/26/1966       Patient Gender: M Patient Age:   55Y Exam Location:  [facility] Procedure:      VAS [place] UPPER EXT VEIN MAPPING (PRE-OP AVF) Referring Phys: [id number] Yevonne Aline HAWKEN --------------------------------------------------------------------------------  Indications: Pre-access. History: CKD.  Comparison Study: No prior study Performing Technologist: Maudry Mayhew MHA, RDMS, RVT, RDCS  Examination Guidelines: A complete evaluation includes B-mode imaging, spectral Doppler, color Doppler, and power Doppler as needed of all accessible portions of each vessel. Bilateral testing is considered an integral part of a complete examination. Limited examinations for reoccurring indications may be performed as noted. +-----------------+-------------+----------+-------------------+ Right Cephalic   Diameter (cm)Depth (cm)     Findings       +-----------------+-------------+----------+-------------------+  Shoulder             0.22        0.49                       +-----------------+-------------+----------+-------------------+ Prox upper arm       0.22        0.47                       +-----------------+-------------+----------+-------------------+ Mid upper arm        0.20        0.29                       +-----------------+-------------+----------+-------------------+ Dist upper arm       0.22        0.19                       +-----------------+-------------+----------+-------------------+ Antecubital fossa    0.24        0.31                       +-----------------+-------------+----------+-------------------+ Prox forearm         0.16        0.30                       +-----------------+-------------+----------+-------------------+ Mid forearm          0.13        0.26        branching      +-----------------+-------------+----------+-------------------+ Wrist                                   not visualized (IV) +-----------------+-------------+----------+-------------------+ +-----------------+-------------+----------+---------+ Right Basilic    Diameter (cm)Depth (cm)Findings  +-----------------+-------------+----------+---------+ Mid upper arm        0.37                         +-----------------+-------------+----------+---------+  Dist upper arm       0.46                         +-----------------+-------------+----------+---------+ Antecubital fossa    0.22               branching +-----------------+-------------+----------+---------+ Prox forearm         0.14                         +-----------------+-------------+----------+---------+ Mid forearm          0.13                         +-----------------+-------------+----------+---------+ Distal forearm       0.14                         +-----------------+-------------+----------+---------+ Wrist                0.13                          +-----------------+-------------+----------+---------+ +-----------------+-------------+----------+---------+ Left Cephalic    Diameter (cm)Depth (cm)Findings  +-----------------+-------------+----------+---------+ Shoulder             0.19        0.37             +-----------------+-------------+----------+---------+ Prox upper arm       0.19        0.47             +-----------------+-------------+----------+---------+ Mid upper arm        0.14        0.29             +-----------------+-------------+----------+---------+ Dist upper arm       0.13        0.32             +-----------------+-------------+----------+---------+ Antecubital fossa    0.18        0.30             +-----------------+-------------+----------+---------+ Prox forearm         0.16        0.23   branching +-----------------+-------------+----------+---------+ Mid forearm          0.17        0.20             +-----------------+-------------+----------+---------+ Dist forearm         0.12        0.19             +-----------------+-------------+----------+---------+ Wrist                0.06        0.22             +-----------------+-------------+----------+---------+ +-----------------+-------------+----------+---------+ Left Basilic     Diameter (cm)Depth (cm)Findings  +-----------------+-------------+----------+---------+ Prox upper arm       0.59                         +-----------------+-------------+----------+---------+ Mid upper arm        0.28               branching +-----------------+-------------+----------+---------+ Dist upper arm       0.31                         +-----------------+-------------+----------+---------+  Antecubital fossa    0.41                         +-----------------+-------------+----------+---------+ Prox forearm         0.18               branching +-----------------+-------------+----------+---------+ Mid forearm           0.11                         +-----------------+-------------+----------+---------+ Distal forearm       0.08                         +-----------------+-------------+----------+---------+ Wrist                0.11                         +-----------------+-------------+----------+---------+ *See table(s) above for measurements and observations.  Diagnosing physician:    Preliminary    . amLODipine  5 mg Oral Daily  . calcitRIOL  0.25 mcg Oral Daily  . calcium acetate (Phos Binder)  1,334 mg Oral TID WC  . Chlorhexidine Gluconate Cloth  6 each Topical Daily  . COVID-19 mRNA vaccine (Moderna)  0.5 mL Intramuscular Once  . darbepoetin (ARANESP) injection - NON-DIALYSIS  60 mcg Subcutaneous Q Tue-1800  . feeding supplement  1 Container Oral TID BM  . finasteride  5 mg Oral Daily  . heparin  5,000 Units Subcutaneous Q8H  . ondansetron  4 mg Oral Once  . senna-docusate  1 tablet Oral QHS  . tamsulosin  0.4 mg Oral QPC supper    BMET    Component Value Date/Time   NA 133 (L) 11/07/2020 0317   K 4.0 11/07/2020 0317   CL 99 11/07/2020 0317   CO2 19 (L) 11/07/2020 0317   GLUCOSE 108 (H) 11/07/2020 0317   BUN 95 (H) 11/07/2020 0317   CREATININE 12.95 (H) 11/07/2020 0317   CALCIUM 6.6 (L) 11/07/2020 0317   GFRNONAA 4 (L) 11/07/2020 0317   GFRAA 25 (L) 08/15/2018 0728   CBC    Component Value Date/Time   WBC 10.9 (H) 11/07/2020 0317   RBC 2.84 (L) 11/07/2020 0317   HGB 8.0 (L) 11/07/2020 0317   HCT 24.0 (L) 11/07/2020 0317   PLT 195 11/07/2020 0317   MCV 84.5 11/07/2020 0317   MCH 28.2 11/07/2020 0317   MCHC 33.3 11/07/2020 0317   RDW 12.8 11/07/2020 0317   LYMPHSABS 0.4 (L) 11/06/2020 0540   MONOABS 1.0 11/06/2020 0540   EOSABS 0.1 11/06/2020 0540   BASOSABS 0.0 11/06/2020 0540      Assessment/Plan:  1. 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 Renal US with marked  bilateral hydronephrosis. Significant improvement with Foley catheter, but still with significantly elevated BUN/Cr and s/p HD x 2. Last HD session 11/03/20.  1. Scr stable since last HD but BUN slowly climbing.   2. He is without uremic symptoms but will plan for another HD session today without UF just for clearance. 3. VVS consulted for AVF/AVG given his advanced CKD 4. Good UOP without foley.  2. Urinary obstruction - bilateral hydronephrosis, present since 2020 but progressed since then. Likely chronic. Foley catheter removed today and will need to follow PVR. He may benefit  from urology evaluation if remains an issue. Currently on finasteride and tamsulosin. 1.  Will need to check bladder scan in am.  2. May need foley catheter replaced if PVR's remain elevated.  3. Consider urology evaluation for recommendations. 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 Receiving IV calcium and started on calcium acetate binders qac. PTH elevated at 406 and will start calcitriol.  4. Anemia of CKD stage IV-V: S/p blood transfusion and now on ESA. 5. Vascular access: Had RIJ [place] placed 11/02/20 and will need Vascular surgery evaluation for placement of AVF/AVG given advanced CKD stage IV dating back to 2020. 6. Hypertensive urgency: Improved BP with meds, had not taken any for the past 2 years.    Donetta Potts, MD [employer] [phone]

## 2020-11-07 NOTE — Progress Notes (Signed)
Attempted stat upper extremity vein mapping, however patient is getting an IV, and additional nursing care at this time. Will attempt again as schedule permits.  11/07/2020 11:04 AM Kelby Aline., MHA, RVT, RDCS, RDMS

## 2020-11-08 LAB — RENAL FUNCTION PANEL
Albumin: 2.7 g/dL — ABNORMAL LOW (ref 3.5–5.0)
Anion gap: 11 (ref 5–15)
BUN: 51 mg/dL — ABNORMAL HIGH (ref 6–20)
CO2: 25 mmol/L (ref 22–32)
Calcium: 7.4 mg/dL — ABNORMAL LOW (ref 8.9–10.3)
Chloride: 99 mmol/L (ref 98–111)
Creatinine, Ser: 7.98 mg/dL — ABNORMAL HIGH (ref 0.61–1.24)
GFR, Estimated: 7 mL/min — ABNORMAL LOW (ref >60)
Glucose, Bld: 102 mg/dL — ABNORMAL HIGH (ref 70–99)
Phosphorus: 3.9 mg/dL (ref 2.5–4.6)
Potassium: 3.9 mmol/L (ref 3.5–5.1)
Sodium: 135 mmol/L (ref 135–145)

## 2020-11-08 LAB — CALCIUM, IONIZED: Calcium, Ionized, Serum: 3.5 mg/dL — ABNORMAL LOW (ref 4.5–5.6)

## 2020-11-08 MED ORDER — CALCIUM ACETATE (PHOS BINDER) 667 MG/5ML PO SOLN
1334.0000 mg | Freq: Three times a day (TID) | ORAL | Status: DC
  Administered 2020-11-08 – 2020-11-13 (×13): 1334 mg via ORAL
  Filled 2020-11-08 (×18): qty 10

## 2020-11-08 MED ORDER — CALCIUM ACETATE (PHOS BINDER) 667 MG PO CAPS
1334.0000 mg | ORAL_CAPSULE | Freq: Three times a day (TID) | ORAL | Status: DC
  Filled 2020-11-08: qty 2

## 2020-11-08 NOTE — Progress Notes (Addendum)
Patient ID: Daniel Mccarty, male   DOB: 1966-05-17, 55 y.o.   MRN: [id number] S:No new complaints.  Tolerated HD well yesterday.  Foley catheter has also been replaced O:BP 134/82   Pulse 92   Temp 99 F (37.2 C) (Oral)   Resp 16   Ht 6\' 1"  (1.854 m)   Wt 83.6 kg   SpO2 98%   BMI 24.30 kg/m   Intake/Output Summary (Last 24 hours) at 11/08/2020 1315 Last data filed at 11/07/2020 2234 Gross per 24 hour  Intake --  Output 2000 ml  Net -2000 ml   Intake/Output: I/O last 3 completed shifts: In: -  Out: 2200 [Urine:2200]  Intake/Output this shift:  No intake/output data recorded. Weight change: 0.513 kg Gen: NAD CVS: RRR Resp: CTA Abd: +BS, soft, NT/ND Ext: no edema  Recent Labs  Lab 11/01/20 1807 11/01/20 1940 11/02/20 0618 11/03/20 0318 11/03/20 1440 11/04/20 0246 11/05/20 0720 11/06/20 0540 11/07/20 0317 11/08/20 0350  NA  --   --  133* 134* 136 136 134* 133* 133* 135  K  --   --  3.3* 2.7* 3.3* 3.6 3.4* 3.4* 4.0 3.9  CL  --   --  95* 91* 91* 98 99 97* 99 99  CO2  --   --  9* 23 26 25 22  21* 19* 25  GLUCOSE  --   --  135* 153* 121* 114* 129* 123* 108* 102*  BUN  --   --  231* 145* 137* 75* 85* 94* 95* 51*  CREATININE  --    < > 33.43* 19.53* 19.54* 12.57* 13.11* 12.96* 12.95* 7.98*  ALBUMIN 4.2  --   --  3.3* 3.3*  --   --   --  3.1* 2.7*  CALCIUM  --   --  6.3* 6.0* 5.8* 6.5* 6.3* 6.4* 6.6* 7.4*  PHOS  --   --  >30.0* 6.2* 7.1*  --   --   --  4.4 3.9  AST 11*  --   --   --   --   --   --   --   --   --   ALT 11  --   --   --   --   --   --   --   --   --    < > = values in this interval not displayed.   Liver Function Tests: Recent Labs  Lab 11/01/20 1807 11/03/20 0318 11/03/20 1440 11/07/20 0317 11/08/20 0350  AST 11*  --   --   --   --   ALT 11  --   --   --   --   ALKPHOS 50  --   --   --   --   BILITOT 0.7  --   --   --   --   PROT 8.0  --   --   --   --   ALBUMIN 4.2   < > 3.3* 3.1* 2.7*   < > = values in this interval not displayed.   No  results for input(s): LIPASE, AMYLASE in the last 168 hours. No results for input(s): AMMONIA in the last 168 hours. CBC: Recent Labs  Lab 11/01/20 1523 11/01/20 1940 11/03/20 0318 11/03/20 1439 11/04/20 0246 11/05/20 0720 11/06/20 0540 11/07/20 0317  WBC 8.4   < > 7.8 8.3 7.7  --  9.3 10.9*  NEUTROABS 7.5  --   --   --   --   --  7.6  --   HGB 5.8*   < > 7.1* 7.0* 7.2* 7.0* 7.7* 8.0*  HCT 18.2*   < > 19.7* 19.9* 21.1* 21.3* 23.2* 24.0*  MCV 84.3   < > 77.3* 79.6* 81.8  --  85.0 84.5  PLT 207   < > 161 155 146*  --  161 195   < > = values in this interval not displayed.   Cardiac Enzymes: No results for input(s): CKTOTAL, CKMB, CKMBINDEX, TROPONINI in the last 168 hours. CBG: No results for input(s): GLUCAP in the last 168 hours.  Iron Studies: No results for input(s): IRON, TIBC, TRANSFERRIN, FERRITIN in the last 72 hours. Studies/Results: VAS [place] UPPER EXT VEIN MAPPING (PRE-OP AVF)  Result Date: 11/07/2020 [place] MAPPING Patient Name:  Daniel Mccarty  Date of Exam:   11/07/2020 Medical Rec #: [id number]       Accession #:    [id number] Date of Birth: 1966-03-16       Patient Gender: M Patient Age:   55Y Exam Location:  [facility] Procedure:      VAS [place] UPPER EXT VEIN MAPPING (PRE-OP AVF) Referring Phys: [id number] Yevonne Aline HAWKEN --------------------------------------------------------------------------------  Indications: Pre-access. History: CKD.  Comparison Study: No prior study Performing Technologist: Maudry Mayhew MHA, RDMS, RVT, RDCS  Examination Guidelines: A complete evaluation includes B-mode imaging, spectral Doppler, color Doppler, and power Doppler as needed of all accessible portions of each vessel. Bilateral testing is considered an integral part of a complete examination. Limited examinations for reoccurring indications may be performed as noted. +-----------------+-------------+----------+-------------------+ Right Cephalic   Diameter (cm)Depth  (cm)     Findings       +-----------------+-------------+----------+-------------------+ Shoulder             0.22        0.49                       +-----------------+-------------+----------+-------------------+ Prox upper arm       0.22        0.47                       +-----------------+-------------+----------+-------------------+ Mid upper arm        0.20        0.29                       +-----------------+-------------+----------+-------------------+ Dist upper arm       0.22        0.19                       +-----------------+-------------+----------+-------------------+ Antecubital fossa    0.24        0.31                       +-----------------+-------------+----------+-------------------+ Prox forearm         0.16        0.30                       +-----------------+-------------+----------+-------------------+ Mid forearm          0.13        0.26        branching      +-----------------+-------------+----------+-------------------+ Wrist  not visualized (IV) +-----------------+-------------+----------+-------------------+ +-----------------+-------------+----------+---------+ Right Basilic    Diameter (cm)Depth (cm)Findings  +-----------------+-------------+----------+---------+ Mid upper arm        0.37                         +-----------------+-------------+----------+---------+ Dist upper arm       0.46                         +-----------------+-------------+----------+---------+ Antecubital fossa    0.22               branching +-----------------+-------------+----------+---------+ Prox forearm         0.14                         +-----------------+-------------+----------+---------+ Mid forearm          0.13                         +-----------------+-------------+----------+---------+ Distal forearm       0.14                          +-----------------+-------------+----------+---------+ Wrist                0.13                         +-----------------+-------------+----------+---------+ +-----------------+-------------+----------+---------+ Left Cephalic    Diameter (cm)Depth (cm)Findings  +-----------------+-------------+----------+---------+ Shoulder             0.19        0.37             +-----------------+-------------+----------+---------+ Prox upper arm       0.19        0.47             +-----------------+-------------+----------+---------+ Mid upper arm        0.14        0.29             +-----------------+-------------+----------+---------+ Dist upper arm       0.13        0.32             +-----------------+-------------+----------+---------+ Antecubital fossa    0.18        0.30             +-----------------+-------------+----------+---------+ Prox forearm         0.16        0.23   branching +-----------------+-------------+----------+---------+ Mid forearm          0.17        0.20             +-----------------+-------------+----------+---------+ Dist forearm         0.12        0.19             +-----------------+-------------+----------+---------+ Wrist                0.06        0.22             +-----------------+-------------+----------+---------+ +-----------------+-------------+----------+---------+ Left Basilic     Diameter (cm)Depth (cm)Findings  +-----------------+-------------+----------+---------+ Prox upper arm       0.59                         +-----------------+-------------+----------+---------+ Mid upper  arm        0.28               branching +-----------------+-------------+----------+---------+ Dist upper arm       0.31                         +-----------------+-------------+----------+---------+ Antecubital fossa    0.41                         +-----------------+-------------+----------+---------+ Prox forearm          0.18               branching +-----------------+-------------+----------+---------+ Mid forearm          0.11                         +-----------------+-------------+----------+---------+ Distal forearm       0.08                         +-----------------+-------------+----------+---------+ Wrist                0.11                         +-----------------+-------------+----------+---------+ *See table(s) above for measurements and observations.  Diagnosing physician: Jamelle Haring Electronically signed by Jamelle Haring on 11/07/2020 at 7:17:53 PM.    Final     amLODipine  5 mg Oral Daily   calcitRIOL  0.25 mcg Oral Daily   calcium acetate (Phos Binder)  1,334 mg Oral TID WC   Chlorhexidine Gluconate Cloth  6 each Topical Daily   COVID-19 mRNA vaccine (Moderna)  0.5 mL Intramuscular Once   darbepoetin (ARANESP) injection - NON-DIALYSIS  60 mcg Subcutaneous Q Tue-1800   feeding supplement  1 Container Oral TID BM   finasteride  5 mg Oral Daily   heparin  5,000 Units Subcutaneous Q8H   ondansetron  4 mg Oral Once   senna-docusate  1 tablet Oral QHS   tamsulosin  0.4 mg Oral QPC supper    BMET    Component Value Date/Time   NA 135 11/08/2020 0350   K 3.9 11/08/2020 0350   CL 99 11/08/2020 0350   CO2 25 11/08/2020 0350   GLUCOSE 102 (H) 11/08/2020 0350   BUN 51 (H) 11/08/2020 0350   CREATININE 7.98 (H) 11/08/2020 0350   CALCIUM 7.4 (L) 11/08/2020 0350   GFRNONAA 7 (L) 11/08/2020 0350   GFRAA 25 (L) 08/15/2018 0728   CBC    Component Value Date/Time   WBC 10.9 (H) 11/07/2020 0317   RBC 2.84 (L) 11/07/2020 0317   HGB 8.0 (L) 11/07/2020 0317   HCT 24.0 (L) 11/07/2020 0317   PLT 195 11/07/2020 0317   MCV 84.5 11/07/2020 0317   MCH 28.2 11/07/2020 0317   MCHC 33.3 11/07/2020 0317   RDW 12.8 11/07/2020 0317   LYMPHSABS 0.4 (L) 11/06/2020 0540   MONOABS 1.0 11/06/2020 0540   EOSABS 0.1 11/06/2020 0540   BASOSABS 0.0 11/06/2020 0540     Assessment/Plan:    Non-oliguric, 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  Renal US with marked bilateral hydronephrosis.  Significant improvement with Foley catheter, but still with significantly elevated BUN/Cr and s/p HD x 2.  Last HD session 11/03/20.  Scr stable since last HD but BUN slowly climbing.   He is without uremic symptoms but had HD 11/07/20 due to elevated BUN/Cr without UF. Will hold off on further HD and follow BUN/CR over the next 24 to 48 hours VVS consulted for AVF/AVG given his advanced CKD and for avf tomorrow. Good UOP without foley.   Urinary obstruction - bilateral hydronephrosis, present since 2020 but progressed since then.  Likely chronic.  Foley catheter removed today and will need to follow PVR.  He may benefit from urology evaluation if remains an issue.  Currently on finasteride and tamsulosin. foley catheter replaced due to elevated PVR's. Consider urology evaluation for recommendations. 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  Receiving IV calcium and started on calcium acetate binders qac.  PTH elevated at 406 and will start calcitriol. Anemia of CKD stage IV-V:  S/p blood transfusion and now on ESA. Vascular access:  Had RIJ [place] placed 11/02/20 and will need Vascular surgery evaluation for placement of AVF/AVG given advanced CKD stage IV dating back to 2020. Hypertensive urgency:  Improved BP with meds, had not taken any for the past 2 years.   Donetta Potts, MD [employer] [phone]

## 2020-11-08 NOTE — Progress Notes (Signed)
Occupational Therapy Treatment Patient Details Name: Daniel Mccarty MRN: [id number] DOB: Oct 09, 1965 Today's Date: 11/08/2020    History of present illness Patient is a 55 y.o. male admitted from PCP on 11/01/20 due to elevated BP; workup for hypertensive urgency, AKI on CKD IV, metabolic acidosis, severe anemia. S/p tunneled HD cath placement 11/02/20 and initation of HD. PMH includes HTN, BPH, urinary retention.   OT comments  Pt received in recliner initially friendly toward therapist upon arrival. Pt with increasing agitation with completion of medicog. After instructions provided, pt stated "I just don't get what I am supposed to do" and demonstrated increasing frustration. Pt scored 5/10 on the medicog, an 8/10 score indicates adequate cognition. Session limited secondary to pt's agitation. Pt would benefit from [facility] should he have medications to address at discharge. Pt will continue to benefit from skilled OT services to maximize safety and independence with ADL/IADL and functional mobility. Will continue to follow acutely and progress as tolerated.    Follow Up Recommendations  No OT follow up    Equipment Recommendations  None recommended by OT    Recommendations for Other Services      Precautions / Restrictions Precautions Precautions: Other (comment) Precaution Comments: Watch HR Restrictions Weight Bearing Restrictions: No           Cognition Arousal/Alertness: Awake/alert Behavior During Therapy: Flat affect;Agitated Overall Cognitive Status: Impaired/Different from baseline (unsure pt's baseline) Area of Impairment: Problem solving;Memory;Following commands                     Memory: Decreased short-term memory Following Commands: Follows one step commands with increased time     Problem Solving: Slow processing;Requires verbal cues General Comments: upon arrival, pt agreeable to OT session but appeared agitation. Pt with increasing agitation during  cognitive assessment. therapist Therapist educated pt on the importance of completion of this assessment and safety implications with limitations demonstrated. Pt reports he doesn't have any medications at baseline. pt completed medicog assessment and scored 5/10; an 8/10 score may indicate adequate skills. Pt demonstrated difficulty with the medication transfer screen scoring 0/5. Pt demonstrates limitation with executive functioning skills and following instructions. Following the assessment as therapist walked out of the room, heard pt state "I hate doing stupid stuff, I don't know why I have to do stupid stuff like this when I am in the hospital". notified RN of pt's need for [facility] should pt have medication needs at d/c.                   Pertinent Vitals/ Pain       Pain Assessment: No/denies pain Pain Intervention(s): Monitored during session   Frequency  Min 2X/week        Progress Toward Goals  OT Goals(current goals can now be found in the care plan section)  Progress towards OT goals: Progressing toward goals  Acute Rehab OT Goals Patient Stated Goal: To return home. OT Goal Formulation: With patient Time For Goal Achievement: 11/17/20 Potential to Achieve Goals: Fair ADL Goals Pt Will Perform Grooming: Independently;standing Pt Will Transfer to Toilet: Independently;ambulating;regular height toilet  Plan Discharge plan remains appropriate;Frequency remains appropriate       AM-PAC OT "6 Clicks" Daily Activity     Outcome Measure   Help from another person eating meals?: None Help from another person taking care of personal grooming?: None Help from another person toileting, which includes using toliet, bedpan, or urinal?: A Little Help from another person bathing (  including washing, rinsing, drying)?: None Help from another person to put on and taking off regular upper body clothing?: None Help from another person to put on and taking off regular lower body  clothing?: None 6 Click Score: 23    End of Session    OT Visit Diagnosis: Muscle weakness (generalized) (M62.81);Dizziness and giddiness (R42)   Activity Tolerance Treatment limited secondary to agitation   Patient Left in chair;with call bell/phone within reach;with family/visitor present   Nurse Communication Mobility status        Time: 1427-1440 OT Time Calculation (min): 13 min  Charges: OT General Charges $OT Visit: 1 Visit OT Treatments $Self Care/Home Management : 8-22 mins  Helene Kelp OTR/L Acute Rehabilitation Services Office: [place] 11/08/2020, 3:07 PM

## 2020-11-08 NOTE — H&P (View-Only) (Signed)
  Progress Note    11/08/2020 8:29 AM * No surgery date entered *  Subjective:  no complaints   Vitals:   11/07/20 2230 11/08/20 0700  BP: 140/86 108/86  Pulse:  92  Resp:  16  Temp: 98.6 F (37 C) 99 F (37.2 C)  SpO2: 98%    Physical Exam: Lungs:  non labored Extremities:  symmetrical radial pulses Neurologic: A&O  CBC    Component Value Date/Time   WBC 10.9 (H) 11/07/2020 0317   RBC 2.84 (L) 11/07/2020 0317   HGB 8.0 (L) 11/07/2020 0317   HCT 24.0 (L) 11/07/2020 0317   PLT 195 11/07/2020 0317   MCV 84.5 11/07/2020 0317   MCH 28.2 11/07/2020 0317   MCHC 33.3 11/07/2020 0317   RDW 12.8 11/07/2020 0317   LYMPHSABS 0.4 (L) 11/06/2020 0540   MONOABS 1.0 11/06/2020 0540   EOSABS 0.1 11/06/2020 0540   BASOSABS 0.0 11/06/2020 0540    BMET    Component Value Date/Time   NA 135 11/08/2020 0350   K 3.9 11/08/2020 0350   CL 99 11/08/2020 0350   CO2 25 11/08/2020 0350   GLUCOSE 102 (H) 11/08/2020 0350   BUN 51 (H) 11/08/2020 0350   CREATININE 7.98 (H) 11/08/2020 0350   CALCIUM 7.4 (L) 11/08/2020 0350   GFRNONAA 7 (L) 11/08/2020 0350   GFRAA 25 (L) 08/15/2018 0728    INR No results found for: INR   Intake/Output Summary (Last 24 hours) at 11/08/2020 0829 Last data filed at 11/07/2020 2234 Gross per 24 hour  Intake --  Output 2000 ml  Net -2000 ml     Assessment/Plan:  55 y.o. male now with ESRD on HD  Plan is for L arm AV fistula creation tomorrow All questions answered NPO past midnight Consent   Dagoberto Ligas, PA-C [facility] [phone] 11/08/2020 8:29 AM

## 2020-11-08 NOTE — Plan of Care (Signed)
  Problem: Clinical Measurements: Goal: Will remain free from infection Outcome: Progressing   Problem: Clinical Measurements: Goal: Cardiovascular complication will be avoided Outcome: Progressing   Problem: Elimination: Goal: Will not experience complications related to urinary retention Outcome: Progressing   Problem: Pain Managment: Goal: General experience of comfort will improve Outcome: Progressing   Problem: Safety: Goal: Ability to remain free from injury will improve Outcome: Progressing   Problem: Skin Integrity: Goal: Risk for impaired skin integrity will decrease Outcome: Progressing   Problem: Fluid Volume: Goal: Fluid volume balance will be maintained or improved Outcome: Progressing   Problem: Respiratory: Goal: Respiratory symptoms related to disease process will be avoided Outcome: Progressing

## 2020-11-08 NOTE — Progress Notes (Signed)
PROGRESS NOTE    Daniel Mccarty  YNW:[id number] DOB: [birth year] DOA: 11/01/2020 PCP: Patient, No Pcp Per (Inactive)    Brief Narrative:  Daniel Mccarty is a 55 year old male with past medical history significant for essential hypertension, CKD stage IV, urinary retention, BPH who presented to [facility] ED on 6/2 at the recommendation of his PCP due to hypertensive urgency with SBP in the 200s.  Patient has not seen a provider for more than 2 years and has been off his antihypertensives for the last 2 years since he has had no refills.  Patient reports progressive fatigue.  In the ED, patient was noted to have a BUN of 239 with a creatinine of 33, serum bicarbonate less than 7, hemoglobin 5.8, platelets 207.  Bicarb drip.  Nephrology was consulted.  TRH consulted for further evaluation and management of hypertensive urgency, and progression of renal failure.   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  Found to have elevated BUN of 239 with a creatinine of 33.  SBP in the 200s on ED arrival.  Patient has been off antihypertensive treatment for the last 2 years due to noncompliance with outpatient visits.  Renal ultrasound with bilateral hydronephrosis with distended urinary bladder with associated renal cortical thinning and increased parenchymal echogenicity suggesting chronic cause. --Nephrology/vascular surgery following, appreciate assistance --Cr 33>>>12.96>12.95>7.98 --Vascular surgery plans AV fistula placement tomorrow --Continue HD per nephrology, received yesterday --N.p.o. after midnight  Hypertensive urgency Patient presenting  with SBP in the 200s.  Has been off of antihypertensives for 2 years due to noncompliance with medical therapy. BP 140/86 this morning --Amlodipine 5 mg p.o. daily  Obstructive uropathy BPH Ultrasound on 6/2 with marked bilateral hydronephrosis with renal cortical thinning and increased parenchymal echogenicity suggesting chronic, distended urinary bladder and mild bladder wall thickening.  Etiology likely secondary to chronic BPH causing obstruction and now superimposed medical renal disease.  Foley catheter initially placed which was discontinued on 11/05/2020; now replaced 6/8 due to recurrent high PVRs. --Tamsulosin 0.4 mg p.o. daily --Finasteride 5 mg p.o. daily  Anemia of chronic renal disease Hemoglobin 5.8 on admission.  Transfuse 2 unit PRBC on 6/2 and 1 unit PRBC on 6/6.  Iron 123, TIBC 206, ferritin 374, folate 7.1, vitamin B12 1063. --Hgb 5.8>>>7.7>8.0 --Transfuse for hemoglobin less than 7.0  Hypocalcemia Received 2 g calcium gluconate on 6/2 and 1 g calcium gluconate on 6/6. --Calcium 6.6 with albumin 3.1, corrected for hypoalbuminemia calcium 7.3.  Moderate protein calorie malnutrition: Body mass index is 24.3 kg/m. Nutrition Status: Nutrition Problem: Moderate Malnutrition Etiology: chronic illness (CKD) Signs/Symptoms: mild muscle depletion, mild fat depletion Interventions: Boost Breeze  --Dietitian following, appreciate assistance --Continue to encourage increased oral intake, supplements     DVT prophylaxis: heparin injection 5,000 Units Start: 11/01/20 2200 SCDs Start: 11/01/20 1941    Code Status: Full Code Family Communication: None present at bedside this morning  Disposition Plan:  Level of care: Med-Surg Status is: Inpatient  Remains inpatient appropriate because:Ongoing diagnostic testing needed not appropriate for outpatient work up, Unsafe d/c plan, IV treatments appropriate due to intensity of illness or inability to take  PO and Inpatient level  of care appropriate due to severity of illness   Dispo:  Patient From: Home  Planned Disposition: Home  Medically stable for discharge: No       Consultants:  Nephrology Vascular surgery Interventional radiology  Procedures:  Catheter placement, IR 6/3  Antimicrobials:  None   Subjective: Patient seen and examined at bedside, resting comfortably.  Eating breakfast.  No complaints this morning.  Vascular surgery plans AV fistula placement tomorrow.  1 dialysis and tolerated well yesterday.  Foley catheter placed yesterday for persistent elevated PVRs.  No other complaints or concerns at this time.  Denies headache, no dizziness, no fever/chills/night sweats, no nausea/vomiting/diarrhea, no chest pain, no palpitations, no shortness of breath, no abdominal pain, no weakness, no fatigue, no paresthesias.  No acute events overnight per nursing staff.  Objective: Vitals:   11/07/20 2230 11/08/20 0500 11/08/20 0700 11/08/20 0837  BP: 140/86  108/86 134/82  Pulse:   92   Resp:   16   Temp: 98.6 F (37 C)  99 F (37.2 C)   TempSrc: Oral  Oral   SpO2: 98%     Weight:  83.6 kg    Height:        Intake/Output Summary (Last 24 hours) at 11/08/2020 1050 Last data filed at 11/07/2020 2234 Gross per 24 hour  Intake --  Output 2000 ml  Net -2000 ml   Filed Weights   11/07/20 1855 11/07/20 2200 11/08/20 0500  Weight: 85.2 kg 85 kg 83.6 kg    Examination:  General exam: Appears calm and comfortable  Respiratory system: Clear to auscultation. Respiratory effort normal. Cardiovascular system: S1 & S2 heard, RRR. No JVD, murmurs, rubs, gallops or clicks. No pedal edema.  Tunneled HD catheter noted right chest. Gastrointestinal system: Abdomen is nondistended, soft and nontender. No organomegaly or masses felt. Normal bowel sounds heard. Central nervous system: Alert and oriented. No focal neurological deficits. Extremities: Symmetric 5 x 5 power. Skin: No rashes, lesions or  ulcers Psychiatry: Judgement and insight appear normal. Mood & affect appropriate.     Data Reviewed: I have personally reviewed following labs and imaging studies  CBC: Recent Labs  Lab 11/01/20 1523 11/01/20 1940 11/03/20 0318 11/03/20 1439 11/04/20 0246 11/05/20 0720 11/06/20 0540 11/07/20 0317  WBC 8.4   < > 7.8 8.3 7.7  --  9.3 10.9*  NEUTROABS 7.5  --   --   --   --   --  7.6  --   HGB 5.8*   < > 7.1* 7.0* 7.2* 7.0* 7.7* 8.0*  HCT 18.2*   < > 19.7* 19.9* 21.1* 21.3* 23.2* 24.0*  MCV 84.3   < > 77.3* 79.6* 81.8  --  85.0 84.5  PLT 207   < > 161 155 146*  --  161 195   < > = values in this interval not displayed.   Basic Metabolic Panel: Recent Labs  Lab 11/02/20 0618 11/03/20 0318 11/03/20 1440 11/04/20 0246 11/05/20 0720 11/06/20 0540 11/07/20 0317 11/08/20 0350  NA 133* 134* 136 136 134* 133* 133* 135  K 3.3* 2.7* 3.3* 3.6 3.4* 3.4* 4.0 3.9  CL 95* 91* 91* 98 99 97* 99 99  CO2 9* 23 26 25 22  21* 19* 25  GLUCOSE 135* 153* 121* 114* 129* 123* 108* 102*  BUN 231* 145* 137* 75* 85* 94* 95* 51*  CREATININE 33.43* 19.53* 19.54* 12.57* 13.11* 12.96* 12.95* 7.98*  CALCIUM 6.3* 6.0* 5.8* 6.5* 6.3* 6.4*  6.6* 7.4*  MG 1.6*  --   --   --   --   --  1.5*  --   PHOS >30.0* 6.2* 7.1*  --   --   --  4.4 3.9   GFR: Estimated Creatinine Clearance: 12 mL/min (A) (by C-G formula based on SCr of 7.98 mg/dL (H)). Liver Function Tests: Recent Labs  Lab 11/01/20 1807 11/03/20 0318 11/03/20 1440 11/07/20 0317 11/08/20 0350  AST 11*  --   --   --   --   ALT 11  --   --   --   --   ALKPHOS 50  --   --   --   --   BILITOT 0.7  --   --   --   --   PROT 8.0  --   --   --   --   ALBUMIN 4.2 3.3* 3.3* 3.1* 2.7*   No results for input(s): LIPASE, AMYLASE in the last 168 hours. No results for input(s): AMMONIA in the last 168 hours. Coagulation Profile: No results for input(s): INR, PROTIME in the last 168 hours. Cardiac Enzymes: No results for input(s): CKTOTAL, CKMB,  CKMBINDEX, TROPONINI in the last 168 hours. BNP (last 3 results) No results for input(s): PROBNP in the last 8760 hours. HbA1C: No results for input(s): HGBA1C in the last 72 hours. CBG: No results for input(s): GLUCAP in the last 168 hours. Lipid Profile: No results for input(s): CHOL, HDL, LDLCALC, TRIG, CHOLHDL, LDLDIRECT in the last 72 hours. Thyroid Function Tests: No results for input(s): TSH, T4TOTAL, FREET4, T3FREE, THYROIDAB in the last 72 hours. Anemia Panel: No results for input(s): VITAMINB12, FOLATE, FERRITIN, TIBC, IRON, RETICCTPCT in the last 72 hours. Sepsis Labs: No results for input(s): PROCALCITON, LATICACIDVEN in the last 168 hours.  Recent Results (from the past 240 hour(s))  Culture, Urine     Status: None   Collection Time: 11/01/20  8:05 PM   Specimen: Urine, Random  Result Value Ref Range Status   Specimen Description URINE, RANDOM  Final   Special Requests NONE  Final   Culture   Final    NO GROWTH Performed at [facility], [address], [place], [place] [address]    Report Status 11/03/2020 FINAL  Final  Resp Panel by RT-PCR (Flu A&B, Covid) Nasopharyngeal Swab     Status: None   Collection Time: 11/01/20  8:24 PM   Specimen: Nasopharyngeal Swab; Nasopharyngeal(NP) swabs in vial transport medium  Result Value Ref Range Status   SARS Coronavirus 2 by RT PCR NEGATIVE NEGATIVE Final    Comment: (NOTE) SARS-CoV-2 target nucleic acids are NOT DETECTED.  The SARS-CoV-2 RNA is generally detectable in upper respiratory specimens during the acute phase of infection. The lowest concentration of SARS-CoV-2 viral copies this assay can detect is 138 copies/mL. A negative result does not preclude SARS-Cov-2 infection and should not be used as the sole basis for treatment or other patient management decisions. A negative result may occur with  improper specimen collection/handling, submission of specimen other than nasopharyngeal swab, presence of viral  mutation(s) within the areas targeted by this assay, and inadequate number of viral copies(<138 copies/mL). A negative result must be combined with clinical observations, patient history, and epidemiological information. The expected result is Negative.  Fact Sheet for Patients:  EntrepreneurPulse.com.au  Fact Sheet for Healthcare Providers:  IncredibleEmployment.be  This test is no t yet approved or cleared by the [place] and  has been authorized for detection and/or diagnosis of SARS-CoV-2 by FDA under an Emergency Use Authorization (EUA). This EUA will remain  in effect (meaning this test can be used) for the duration of the COVID-19 declaration under Section 564(b)(1) of the Act, 21 U.S.C.section 360bbb-3(b)(1), unless the authorization is terminated  or revoked sooner.       Influenza A by PCR NEGATIVE NEGATIVE Final   Influenza B by PCR NEGATIVE NEGATIVE Final    Comment: (NOTE) The Xpert Xpress SARS-CoV-2/FLU/RSV plus assay is intended as an aid in the diagnosis of influenza from Nasopharyngeal swab specimens and should not be used as a sole basis for treatment. Nasal washings and aspirates are unacceptable for Xpert Xpress SARS-CoV-2/FLU/RSV testing.  Fact Sheet for Patients: EntrepreneurPulse.com.au  Fact Sheet for Healthcare Providers: IncredibleEmployment.be  This test is not yet approved or cleared by the [place] FDA and has been authorized for detection and/or diagnosis of SARS-CoV-2 by FDA under an Emergency Use Authorization (EUA). This EUA will remain in effect (meaning this test can be used) for the duration of the COVID-19 declaration under Section 564(b)(1) of the Act, 21 U.S.C. section 360bbb-3(b)(1), unless the authorization is terminated or revoked.  Performed at [facility], [place] [address] [place],  [address]          Radiology Studies: VAS  [place] UPPER EXT VEIN MAPPING (PRE-OP AVF)  Result Date: 11/07/2020 [place] MAPPING Patient Name:  MUKESH KORNEGAY  Date of Exam:   11/07/2020 Medical Rec #: [id number]       Accession #:    [id number] Date of Birth: [birth year]       Patient Gender: M Patient Age:   55Y Exam Location:  [facility] Procedure:      VAS [place] UPPER EXT VEIN MAPPING (PRE-OP AVF) Referring Phys: [id number] Yevonne Aline HAWKEN --------------------------------------------------------------------------------  Indications: Pre-access. History: CKD.  Comparison Study: No prior study Performing Technologist: Maudry Mayhew MHA, RDMS, RVT, RDCS  Examination Guidelines: A complete evaluation includes B-mode imaging, spectral Doppler, color Doppler, and power Doppler as needed of all accessible portions of each vessel. Bilateral testing is considered an integral part of a complete examination. Limited examinations for reoccurring indications may be performed as noted. +-----------------+-------------+----------+-------------------+ Right Cephalic   Diameter (cm)Depth (cm)     Findings       +-----------------+-------------+----------+-------------------+ Shoulder             0.22        0.49                       +-----------------+-------------+----------+-------------------+ Prox upper arm       0.22        0.47                       +-----------------+-------------+----------+-------------------+ Mid upper arm        0.20        0.29                       +-----------------+-------------+----------+-------------------+ Dist upper arm       0.22        0.19                       +-----------------+-------------+----------+-------------------+ Antecubital fossa    0.24        0.31                       +-----------------+-------------+----------+-------------------+  Prox forearm         0.16        0.30                       +-----------------+-------------+----------+-------------------+ Mid forearm           0.13        0.26        branching      +-----------------+-------------+----------+-------------------+ Wrist                                   not visualized (IV) +-----------------+-------------+----------+-------------------+ +-----------------+-------------+----------+---------+ Right Basilic    Diameter (cm)Depth (cm)Findings  +-----------------+-------------+----------+---------+ Mid upper arm        0.37                         +-----------------+-------------+----------+---------+ Dist upper arm       0.46                         +-----------------+-------------+----------+---------+ Antecubital fossa    0.22               branching +-----------------+-------------+----------+---------+ Prox forearm         0.14                         +-----------------+-------------+----------+---------+ Mid forearm          0.13                         +-----------------+-------------+----------+---------+ Distal forearm       0.14                         +-----------------+-------------+----------+---------+ Wrist                0.13                         +-----------------+-------------+----------+---------+ +-----------------+-------------+----------+---------+ Left Cephalic    Diameter (cm)Depth (cm)Findings  +-----------------+-------------+----------+---------+ Shoulder             0.19        0.37             +-----------------+-------------+----------+---------+ Prox upper arm       0.19        0.47             +-----------------+-------------+----------+---------+ Mid upper arm        0.14        0.29             +-----------------+-------------+----------+---------+ Dist upper arm       0.13        0.32             +-----------------+-------------+----------+---------+ Antecubital fossa    0.18        0.30             +-----------------+-------------+----------+---------+ Prox forearm         0.16        0.23   branching  +-----------------+-------------+----------+---------+ Mid forearm          0.17        0.20             +-----------------+-------------+----------+---------+ Dist forearm  0.12        0.19             +-----------------+-------------+----------+---------+ Wrist                0.06        0.22             +-----------------+-------------+----------+---------+ +-----------------+-------------+----------+---------+ Left Basilic     Diameter (cm)Depth (cm)Findings  +-----------------+-------------+----------+---------+ Prox upper arm       0.59                         +-----------------+-------------+----------+---------+ Mid upper arm        0.28               branching +-----------------+-------------+----------+---------+ Dist upper arm       0.31                         +-----------------+-------------+----------+---------+ Antecubital fossa    0.41                         +-----------------+-------------+----------+---------+ Prox forearm         0.18               branching +-----------------+-------------+----------+---------+ Mid forearm          0.11                         +-----------------+-------------+----------+---------+ Distal forearm       0.08                         +-----------------+-------------+----------+---------+ Wrist                0.11                         +-----------------+-------------+----------+---------+ *See table(s) above for measurements and observations.  Diagnosing physician: Jamelle Haring Electronically signed by Jamelle Haring on 11/07/2020 at 7:17:53 PM.    Final          Scheduled Meds:  amLODipine  5 mg Oral Daily   calcitRIOL  0.25 mcg Oral Daily   calcium acetate (Phos Binder)  1,334 mg Oral TID WC   Chlorhexidine Gluconate Cloth  6 each Topical Daily   COVID-19 mRNA vaccine (Moderna)  0.5 mL Intramuscular Once   darbepoetin (ARANESP) injection - NON-DIALYSIS  60 mcg Subcutaneous Q Tue-1800    feeding supplement  1 Container Oral TID BM   finasteride  5 mg Oral Daily   heparin  5,000 Units Subcutaneous Q8H   ondansetron  4 mg Oral Once   senna-docusate  1 tablet Oral QHS   tamsulosin  0.4 mg Oral QPC supper   Continuous Infusions:   LOS: 7 days    Time spent: 38 minutes spent on chart review, discussion with nursing staff, consultants, updating family and interview/physical exam; more than 50% of that time was spent in counseling and/or coordination of care.    Daniel Mccarty J [place] DO [facility] Available via Epic secure chat 7am-7pm After these hours, please refer to coverage provider listed on [email or web address] 11/08/2020, 10:50 AM

## 2020-11-08 NOTE — Progress Notes (Signed)
  Progress Note    11/08/2020 8:29 AM * No surgery date entered *  Subjective:  no complaints   Vitals:   11/07/20 2230 11/08/20 0700  BP: 140/86 108/86  Pulse:  92  Resp:  16  Temp: 98.6 F (37 C) 99 F (37.2 C)  SpO2: 98%    Physical Exam: Lungs:  non labored Extremities:  symmetrical radial pulses Neurologic: A&O  CBC    Component Value Date/Time   WBC 10.9 (H) 11/07/2020 0317   RBC 2.84 (L) 11/07/2020 0317   HGB 8.0 (L) 11/07/2020 0317   HCT 24.0 (L) 11/07/2020 0317   PLT 195 11/07/2020 0317   MCV 84.5 11/07/2020 0317   MCH 28.2 11/07/2020 0317   MCHC 33.3 11/07/2020 0317   RDW 12.8 11/07/2020 0317   LYMPHSABS 0.4 (L) 11/06/2020 0540   MONOABS 1.0 11/06/2020 0540   EOSABS 0.1 11/06/2020 0540   BASOSABS 0.0 11/06/2020 0540    BMET    Component Value Date/Time   NA 135 11/08/2020 0350   K 3.9 11/08/2020 0350   CL 99 11/08/2020 0350   CO2 25 11/08/2020 0350   GLUCOSE 102 (H) 11/08/2020 0350   BUN 51 (H) 11/08/2020 0350   CREATININE 7.98 (H) 11/08/2020 0350   CALCIUM 7.4 (L) 11/08/2020 0350   GFRNONAA 7 (L) 11/08/2020 0350   GFRAA 25 (L) 08/15/2018 0728    INR No results found for: INR   Intake/Output Summary (Last 24 hours) at 11/08/2020 0829 Last data filed at 11/07/2020 2234 Gross per 24 hour  Intake --  Output 2000 ml  Net -2000 ml     Assessment/Plan:  55 y.o. male now with ESRD on HD  Plan is for L arm AV fistula creation tomorrow All questions answered NPO past midnight Consent   Dagoberto Ligas, PA-C [facility] [phone] 11/08/2020 8:29 AM

## 2020-11-09 ENCOUNTER — Inpatient Hospital Stay (HOSPITAL_COMMUNITY): Payer: BC Managed Care – PPO | Admitting: Anesthesiology

## 2020-11-09 ENCOUNTER — Encounter (HOSPITAL_COMMUNITY): Payer: Self-pay | Admitting: Internal Medicine

## 2020-11-09 ENCOUNTER — Encounter (HOSPITAL_COMMUNITY)
Admission: EM | Disposition: A | Discharge status: Home / Self Care | Payer: Self-pay | Source: Home / Self Care | Attending: Internal Medicine

## 2020-11-09 DIAGNOSIS — Z9889 Other specified postprocedural states: Secondary | ICD-10-CM

## 2020-11-09 DIAGNOSIS — Z992 Dependence on renal dialysis: Secondary | ICD-10-CM

## 2020-11-09 DIAGNOSIS — N186 End stage renal disease: Secondary | ICD-10-CM

## 2020-11-09 HISTORY — DX: Other specified postprocedural states: Z98.890

## 2020-11-09 HISTORY — PX: AV FISTULA PLACEMENT: SHX1204

## 2020-11-09 LAB — RENAL FUNCTION PANEL
Albumin: 2.7 g/dL — ABNORMAL LOW (ref 3.5–5.0)
Anion gap: 13 (ref 5–15)
BUN: 62 mg/dL — ABNORMAL HIGH (ref 6–20)
CO2: 22 mmol/L (ref 22–32)
Calcium: 7.5 mg/dL — ABNORMAL LOW (ref 8.9–10.3)
Chloride: 101 mmol/L (ref 98–111)
Creatinine, Ser: 9.06 mg/dL — ABNORMAL HIGH (ref 0.61–1.24)
GFR, Estimated: 6 mL/min — ABNORMAL LOW (ref >60)
Glucose, Bld: 100 mg/dL — ABNORMAL HIGH (ref 70–99)
Phosphorus: 5 mg/dL — ABNORMAL HIGH (ref 2.5–4.6)
Potassium: 4.3 mmol/L (ref 3.5–5.1)
Sodium: 136 mmol/L (ref 135–145)

## 2020-11-09 LAB — CBC
HCT: 22.3 % — ABNORMAL LOW (ref 39.0–52.0)
Hemoglobin: 7.1 g/dL — ABNORMAL LOW (ref 13.0–17.0)
MCH: 28 pg (ref 26.0–34.0)
MCHC: 31.8 g/dL (ref 30.0–36.0)
MCV: 87.8 fL (ref 80.0–100.0)
Platelets: 203 10*3/uL (ref 150–400)
RBC: 2.54 MIL/uL — ABNORMAL LOW (ref 4.22–5.81)
RDW: 12.7 % (ref 11.5–15.5)
WBC: 6.7 10*3/uL (ref 4.0–10.5)
nRBC: 0 % (ref 0.0–0.2)

## 2020-11-09 SURGERY — ARTERIOVENOUS (AV) FISTULA CREATION
Anesthesia: General | Laterality: Left

## 2020-11-09 MED ORDER — ACETAMINOPHEN 500 MG PO TABS: 1000.0000 mg | ORAL_TABLET | Freq: Once | ORAL | Status: DC

## 2020-11-09 MED ORDER — LIDOCAINE HCL (PF) 1 % IJ SOLN
INTRAMUSCULAR | Status: DC | PRN
  Administered 2020-11-09: 5.5 mL

## 2020-11-09 MED ORDER — PAPAVERINE HCL 30 MG/ML IJ SOLN
INTRAMUSCULAR | Status: DC | PRN
  Administered 2020-11-09: 60 mg via INTRAVENOUS

## 2020-11-09 MED ORDER — ONDANSETRON HCL 4 MG/2ML IJ SOLN
INTRAMUSCULAR | Status: DC | PRN
  Administered 2020-11-09: 4 mg via INTRAVENOUS

## 2020-11-09 MED ORDER — 0.9 % SODIUM CHLORIDE (POUR BTL) OPTIME
TOPICAL | Status: DC | PRN
  Administered 2020-11-09: 1000 mL

## 2020-11-09 MED ORDER — LIDOCAINE HCL (PF) 2 % IJ SOLN
INTRAMUSCULAR | Status: AC
  Filled 2020-11-09: qty 5

## 2020-11-09 MED ORDER — CHLORHEXIDINE GLUCONATE 0.12 % MT SOLN: 15.0000 mL | Freq: Once | OROMUCOSAL | Status: AC

## 2020-11-09 MED ORDER — PROPOFOL 10 MG/ML IV BOLUS
INTRAVENOUS | Status: AC
  Filled 2020-11-09: qty 20

## 2020-11-09 MED ORDER — HEPARIN SODIUM (PORCINE) 1000 UNIT/ML IJ SOLN
INTRAMUSCULAR | Status: DC | PRN
  Administered 2020-11-09: 8000 [IU] via INTRAVENOUS

## 2020-11-09 MED ORDER — LIDOCAINE HCL (PF) 1 % IJ SOLN
INTRAMUSCULAR | Status: AC
  Filled 2020-11-09: qty 30

## 2020-11-09 MED ORDER — ORAL CARE MOUTH RINSE: 15.0000 mL | Freq: Once | OROMUCOSAL | Status: AC

## 2020-11-09 MED ORDER — SODIUM CHLORIDE 0.9 % IV SOLN: INTRAVENOUS | Status: DC

## 2020-11-09 MED ORDER — LIDOCAINE-EPINEPHRINE (PF) 1 %-1:200000 IJ SOLN
INTRAMUSCULAR | Status: AC
  Filled 2020-11-09: qty 30

## 2020-11-09 MED ORDER — CHLORHEXIDINE GLUCONATE 0.12 % MT SOLN
OROMUCOSAL | Status: AC
  Administered 2020-11-09: 15 mL via OROMUCOSAL
  Filled 2020-11-09: qty 15

## 2020-11-09 MED ORDER — PAPAVERINE HCL 30 MG/ML IJ SOLN
INTRAMUSCULAR | Status: AC
  Filled 2020-11-09: qty 2

## 2020-11-09 MED ORDER — DEXAMETHASONE SODIUM PHOSPHATE 10 MG/ML IJ SOLN
INTRAMUSCULAR | Status: DC | PRN
  Administered 2020-11-09: 5 mg via INTRAVENOUS

## 2020-11-09 MED ORDER — FENTANYL CITRATE (PF) 100 MCG/2ML IJ SOLN
INTRAMUSCULAR | Status: DC | PRN
  Administered 2020-11-09: 50 ug via INTRAVENOUS

## 2020-11-09 MED ORDER — HEPARIN SODIUM (PORCINE) 1000 UNIT/ML IJ SOLN
INTRAMUSCULAR | Status: AC
  Filled 2020-11-09: qty 2

## 2020-11-09 MED ORDER — CEFAZOLIN SODIUM-DEXTROSE 2-4 GM/100ML-% IV SOLN
INTRAVENOUS | Status: AC
  Filled 2020-11-09: qty 100

## 2020-11-09 MED ORDER — HYDROCODONE-ACETAMINOPHEN 10-325 MG PO TABS: 1.0000 | ORAL_TABLET | Freq: Four times a day (QID) | ORAL | Status: DC | PRN

## 2020-11-09 MED ORDER — EPHEDRINE SULFATE-NACL 50-0.9 MG/10ML-% IV SOSY
PREFILLED_SYRINGE | INTRAVENOUS | Status: DC | PRN
Start: 2020-11-09 — End: 2020-11-09
  Administered 2020-11-09: 10 mg via INTRAVENOUS

## 2020-11-09 MED ORDER — DEXAMETHASONE SODIUM PHOSPHATE 10 MG/ML IJ SOLN
INTRAMUSCULAR | Status: AC
  Filled 2020-11-09: qty 1

## 2020-11-09 MED ORDER — CEFAZOLIN SODIUM-DEXTROSE 2-3 GM-%(50ML) IV SOLR
INTRAVENOUS | Status: DC | PRN
  Administered 2020-11-09: 2 g via INTRAVENOUS

## 2020-11-09 MED ORDER — MIDAZOLAM HCL 5 MG/5ML IJ SOLN
INTRAMUSCULAR | Status: DC | PRN
  Administered 2020-11-09: 2 mg via INTRAVENOUS

## 2020-11-09 MED ORDER — EPHEDRINE 5 MG/ML INJ
INTRAVENOUS | Status: AC
  Filled 2020-11-09: qty 10

## 2020-11-09 MED ORDER — ACETAMINOPHEN 500 MG PO TABS
ORAL_TABLET | ORAL | Status: AC
  Filled 2020-11-09: qty 2

## 2020-11-09 MED ORDER — FENTANYL CITRATE (PF) 250 MCG/5ML IJ SOLN
INTRAMUSCULAR | Status: AC
  Filled 2020-11-09: qty 5

## 2020-11-09 MED ORDER — LIDOCAINE HCL (PF) 2 % IJ SOLN
INTRAMUSCULAR | Status: DC | PRN
  Administered 2020-11-09: 60 mg via INTRADERMAL

## 2020-11-09 MED ORDER — PROTAMINE SULFATE 10 MG/ML IV SOLN
INTRAVENOUS | Status: DC | PRN
  Administered 2020-11-09: 40 mg via INTRAVENOUS

## 2020-11-09 MED ORDER — MIDAZOLAM HCL 2 MG/2ML IJ SOLN
INTRAMUSCULAR | Status: AC
  Filled 2020-11-09: qty 2

## 2020-11-09 MED ORDER — PROPOFOL 10 MG/ML IV BOLUS
INTRAVENOUS | Status: DC | PRN
  Administered 2020-11-09: 200 mg via INTRAVENOUS

## 2020-11-09 MED ORDER — ONDANSETRON HCL 4 MG/2ML IJ SOLN
INTRAMUSCULAR | Status: AC
  Filled 2020-11-09: qty 2

## 2020-11-09 MED ORDER — SODIUM CHLORIDE 0.9 % IV SOLN
INTRAVENOUS | Status: DC | PRN
  Administered 2020-11-09: 500 mL

## 2020-11-09 MED ORDER — SODIUM CHLORIDE 0.9 % IV SOLN
INTRAVENOUS | Status: AC
  Filled 2020-11-09: qty 1.2

## 2020-11-09 MED ORDER — PROTAMINE SULFATE 10 MG/ML IV SOLN
INTRAVENOUS | Status: AC
  Filled 2020-11-09: qty 5

## 2020-11-09 SURGICAL SUPPLY — 34 items
ARMBAND PINK RESTRICT EXTREMIT (MISCELLANEOUS) ×6 IMPLANT
CANISTER SUCT 3000ML PPV (MISCELLANEOUS) ×3 IMPLANT
CANNULA VESSEL 3MM 2 BLNT TIP (CANNULA) ×5 IMPLANT
CLIP VESOCCLUDE MED 6/CT (CLIP) ×3 IMPLANT
CLIP VESOCCLUDE SM WIDE 6/CT (CLIP) ×7 IMPLANT
COVER PROBE W GEL 5X96 (DRAPES) IMPLANT
COVER WAND RF STERILE (DRAPES) ×3 IMPLANT
DECANTER SPIKE VIAL GLASS SM (MISCELLANEOUS) ×5 IMPLANT
DERMABOND ADVANCED (GAUZE/BANDAGES/DRESSINGS) ×2
DERMABOND ADVANCED .7 DNX12 (GAUZE/BANDAGES/DRESSINGS) ×1 IMPLANT
ELECT REM PT RETURN 9FT ADLT (ELECTROSURGICAL) ×3
ELECTRODE REM PT RTRN 9FT ADLT (ELECTROSURGICAL) ×1 IMPLANT
GLOVE SRG 8 PF TXTR STRL LF DI (GLOVE) ×1 IMPLANT
GLOVE SURG MICRO LTX SZ6.5 (GLOVE) ×2 IMPLANT
GLOVE SURG UNDER POLY LF SZ8 (GLOVE) ×2
GOWN STRL REUS W/ TWL LRG LVL3 (GOWN DISPOSABLE) ×3 IMPLANT
GOWN STRL REUS W/TWL LRG LVL3 (GOWN DISPOSABLE) ×6
KIT BASIN OR (CUSTOM PROCEDURE TRAY) ×3 IMPLANT
KIT TURNOVER KIT B (KITS) ×3 IMPLANT
NDL 18GX1X1/2 (RX/OR ONLY) (NEEDLE) IMPLANT
NEEDLE 18GX1X1/2 (RX/OR ONLY) (NEEDLE) ×3 IMPLANT
NS IRRIG 1000ML POUR BTL (IV SOLUTION) ×3 IMPLANT
PACK CV ACCESS (CUSTOM PROCEDURE TRAY) ×3 IMPLANT
PAD ARMBOARD 7.5X6 YLW CONV (MISCELLANEOUS) ×6 IMPLANT
SPONGE SURGIFOAM ABS GEL 100 (HEMOSTASIS) IMPLANT
SUT MNCRL AB 4-0 PS2 18 (SUTURE) ×3 IMPLANT
SUT PROLENE 6 0 BV (SUTURE) ×3 IMPLANT
SUT VIC AB 3-0 SH 27 (SUTURE) ×2
SUT VIC AB 3-0 SH 27X BRD (SUTURE) ×1 IMPLANT
SYR 20ML LL LF (SYRINGE) ×2 IMPLANT
SYR 3ML LL SCALE MARK (SYRINGE) ×2 IMPLANT
TOWEL GREEN STERILE (TOWEL DISPOSABLE) ×3 IMPLANT
UNDERPAD 30X36 HEAVY ABSORB (UNDERPADS AND DIAPERS) ×3 IMPLANT
WATER STERILE IRR 1000ML POUR (IV SOLUTION) ×3 IMPLANT

## 2020-11-09 NOTE — Progress Notes (Signed)
Patient ID: Daniel Mccarty, male   DOB: 1965/06/27, 55 y.o.   MRN: [id number] S: Just got back from left radiocephalic AVF creation.  Feels well, no complaints.  O:BP 107/88 (BP Location: Right Arm)   Pulse 71   Temp 98 F (36.7 C)   Resp 13   Ht 6\' 1"  (1.854 m)   Wt 84.4 kg   SpO2 97%   BMI 24.54 kg/m   Intake/Output Summary (Last 24 hours) at 11/09/2020 1514 Last data filed at 11/09/2020 1411 Gross per 24 hour  Intake 1040 ml  Output 380 ml  Net 660 ml   Intake/Output: I/O last 3 completed shifts: In: 69 [P.O.:740] Out: 750 [Urine:750]  Intake/Output this shift:  Total I/O In: 300 [I.V.:300] Out: 30 [Blood:30] Weight change: -1.6 kg Gen: NAD CVS: RRR Resp: CTA Abd: Benign Ext: no edema, LAVF +T/B  Recent Labs  Lab 11/03/20 0318 11/03/20 1440 11/04/20 0246 11/05/20 0720 11/06/20 0540 11/07/20 0317 11/08/20 0350 11/09/20 0423  NA 134* 136 136 134* 133* 133* 135 136  K 2.7* 3.3* 3.6 3.4* 3.4* 4.0 3.9 4.3  CL 91* 91* 98 99 97* 99 99 101  CO2 23 26 25 22  21* 19* 25 22  GLUCOSE 153* 121* 114* 129* 123* 108* 102* 100*  BUN 145* 137* 75* 85* 94* 95* 51* 62*  CREATININE 19.53* 19.54* 12.57* 13.11* 12.96* 12.95* 7.98* 9.06*  ALBUMIN 3.3* 3.3*  --   --   --  3.1* 2.7* 2.7*  CALCIUM 6.0* 5.8* 6.5* 6.3* 6.4* 6.6* 7.4* 7.5*  PHOS 6.2* 7.1*  --   --   --  4.4 3.9 5.0*   Liver Function Tests: Recent Labs  Lab 11/07/20 0317 11/08/20 0350 11/09/20 0423  ALBUMIN 3.1* 2.7* 2.7*   No results for input(s): LIPASE, AMYLASE in the last 168 hours. No results for input(s): AMMONIA in the last 168 hours. CBC: Recent Labs  Lab 11/03/20 1439 11/04/20 0246 11/05/20 0720 11/06/20 0540 11/07/20 0317 11/09/20 0423  WBC 8.3 7.7  --  9.3 10.9* 6.7  NEUTROABS  --   --   --  7.6  --   --   HGB 7.0* 7.2*   < > 7.7* 8.0* 7.1*  HCT 19.9* 21.1*   < > 23.2* 24.0* 22.3*  MCV 79.6* 81.8  --  85.0 84.5 87.8  PLT 155 146*  --  161 195 203   < > = values in this interval not  displayed.   Cardiac Enzymes: No results for input(s): CKTOTAL, CKMB, CKMBINDEX, TROPONINI in the last 168 hours. CBG: No results for input(s): GLUCAP in the last 168 hours.  Iron Studies: No results for input(s): IRON, TIBC, TRANSFERRIN, FERRITIN in the last 72 hours. Studies/Results: No results found.  amLODipine  5 mg Oral Daily   calcitRIOL  0.25 mcg Oral Daily   calcium acetate (Phos Binder)  1,334 mg Oral TID WC   Chlorhexidine Gluconate Cloth  6 each Topical Daily   COVID-19 mRNA vaccine (Moderna)  0.5 mL Intramuscular Once   darbepoetin (ARANESP) injection - NON-DIALYSIS  60 mcg Subcutaneous Q Tue-1800   feeding supplement  1 Container Oral TID BM   finasteride  5 mg Oral Daily   heparin  5,000 Units Subcutaneous Q8H   ondansetron  4 mg Oral Once   senna-docusate  1 tablet Oral QHS   tamsulosin  0.4 mg Oral QPC supper    BMET    Component Value Date/Time   NA 136 11/09/2020 0423  K 4.3 11/09/2020 0423   CL 101 11/09/2020 0423   CO2 22 11/09/2020 0423   GLUCOSE 100 (H) 11/09/2020 0423   BUN 62 (H) 11/09/2020 0423   CREATININE 9.06 (H) 11/09/2020 0423   CALCIUM 7.5 (L) 11/09/2020 0423   GFRNONAA 6 (L) 11/09/2020 0423   GFRAA 25 (L) 08/15/2018 0728   CBC    Component Value Date/Time   WBC 6.7 11/09/2020 0423   RBC 2.54 (L) 11/09/2020 0423   HGB 7.1 (L) 11/09/2020 0423   HCT 22.3 (L) 11/09/2020 0423   PLT 203 11/09/2020 0423   MCV 87.8 11/09/2020 0423   MCH 28.0 11/09/2020 0423   MCHC 31.8 11/09/2020 0423   RDW 12.7 11/09/2020 0423   LYMPHSABS 0.4 (L) 11/06/2020 0540   MONOABS 1.0 11/06/2020 0540   EOSABS 0.1 11/06/2020 0540   BASOSABS 0.0 11/06/2020 0540    Assessment/Plan:   Non-oliguric, 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  Renal US with marked bilateral hydronephrosis.  Significant improvement with Foley catheter, but still with significantly elevated BUN/Cr and  s/p HD x 2.  Last HD session 11/03/20.   Scr stable since last HD but BUN slowly climbing.   He is without uremic symptoms but had HD 11/07/20 due to elevated BUN/Cr without UF. Will follow BUN/Cr and if still climbing will plan on HD tomorrow. [facility] consulted and had left radiocephalic AVF creation today by Dr. Scot Dock.  Good thrill and bruit.  Good UOP without foley.   Urinary obstruction - bilateral hydronephrosis, present since 2020 but progressed since then.  Likely chronic.  Foley catheter removed today and will need to follow PVR.  He may benefit from urology evaluation if remains an issue.  Currently on finasteride and tamsulosin. foley catheter replaced due to elevated PVR's. Consider urology evaluation for recommendations. 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  Receiving IV calcium and started on calcium acetate binders qac.  PTH elevated at 406 and will start calcitriol. Anemia of CKD stage IV-V:  S/p blood transfusion and now on ESA. Vascular access:  Had RIJ [place] placed 11/02/20 and left radiocephalic avf creation 7/51/70 by Dr. Scot Dock. Hypertensive urgency:  Improved BP with meds, had not taken any for the past 2 years.   Donetta Potts, MD [employer] [phone]

## 2020-11-09 NOTE — Anesthesia Procedure Notes (Signed)
Procedure Name: LMA Insertion Date/Time: 11/09/2020 12:27 PM Performed by: Moshe Salisbury, CRNA Pre-anesthesia Checklist: Patient identified, Emergency Drugs available, Suction available and Patient being monitored Patient Re-evaluated:Patient Re-evaluated prior to induction Oxygen Delivery Method: Circle System Utilized Preoxygenation: Pre-oxygenation with 100% oxygen Induction Type: IV induction Ventilation: Mask ventilation without difficulty LMA: LMA inserted LMA Size: 5.0 Number of attempts: 1 Placement Confirmation: positive ETCO2 Tube secured with: Tape Dental Injury: Teeth and Oropharynx as per pre-operative assessment

## 2020-11-09 NOTE — Interval H&P Note (Signed)
History and Physical Interval Note:  11/09/2020 11:49 AM  Daniel Mccarty  has presented today for surgery, with the diagnosis of CKD.  The various methods of treatment have been discussed with the patient and family. After consideration of risks, benefits and other options for treatment, the patient has consented to  Procedure(s): LEFT ARM ARTERIOVENOUS (AV) FISTULA CREATION VERSUS GRAFT PLACEMENT (Left) as a surgical intervention.  The patient's history has been reviewed, patient examined, no change in status, stable for surgery.  I have reviewed the patient's chart and labs.  Questions were answered to the patient's satisfaction.     Deitra Mayo

## 2020-11-09 NOTE — Discharge Instructions (Addendum)
° °  [facility] ° °Discharge Instructions ° °AV Fistula or Graft Surgery for Dialysis Access ° °Please refer to the following instructions for your post-procedure care. Your surgeon or physician assistant will discuss any changes with you. ° °Activity ° °You may drive the day following your surgery, if you are comfortable and no longer taking prescription pain medication. Resume full activity as the soreness in your incision resolves. ° °Bathing/Showering ° °You may shower after you go home. Keep your incision dry for 48 hours. Do not soak in a bathtub, hot tub, or swim until the incision heals completely. You may not shower if you have a hemodialysis catheter. ° °Incision Care ° °Clean your incision with mild soap and water after 48 hours. Pat the area dry with a clean towel. You do not need a bandage unless otherwise instructed. Do not apply any ointments or creams to your incision. You may have skin glue on your incision. Do not peel it off. It will come off on its own in about one week. Your arm may swell a bit after surgery. To reduce swelling use pillows to elevate your arm so it is above your heart. Your doctor will tell you if you need to lightly wrap your arm with an ACE bandage. ° °Diet ° °Resume your normal diet. There are not special food restrictions following this procedure. In order to heal from your surgery, it is CRITICAL to get adequate nutrition. Your body requires vitamins, minerals, and protein. Vegetables are the best source of vitamins and minerals. Vegetables also provide the perfect balance of protein. Processed food has little nutritional value, so try to avoid this. ° °Medications ° °Resume taking all of your medications. If your incision is causing pain, you may take over-the counter pain relievers such as acetaminophen (Tylenol). If you were prescribed a stronger pain medication, please be aware these medications can cause nausea and constipation. Prevent  nausea by taking the medication with a snack or meal. Avoid constipation by drinking plenty of fluids and eating foods with high amount of fiber, such as fruits, vegetables, and grains. Do not take Tylenol if you are taking prescription pain medications. ° ° ° ° °Follow up °Your surgeon may want to see you in the office following your access surgery. If so, this will be arranged at the time of your surgery. ° °Please call us immediately for any of the following conditions: ° °Increased pain, redness, drainage (pus) from your incision site °Fever of 101 degrees or higher °Severe or worsening pain at your incision site °Hand pain or numbness. ° °Reduce your risk of vascular disease: ° °Stop smoking. If you would like help, call [facility] at 1-800-QUIT-NOW (1-800-784-8669) or  at 336-586-4000 ° °Manage your cholesterol °Maintain a desired weight °Control your diabetes °Keep your blood pressure down ° °Dialysis ° °It will take several weeks to several months for your new dialysis access to be ready for use. Your surgeon will determine when it is OK to use it. Your nephrologist will continue to direct your dialysis. You can continue to use your Permcath until your new access is ready for use. ° °If you have any questions, please call the office at [phone]. ° °

## 2020-11-09 NOTE — Interval H&P Note (Signed)
History and Physical Interval Note:  11/09/2020 11:48 AM  Daniel Mccarty  has presented today for surgery, with the diagnosis of CKD.  The various methods of treatment have been discussed with the patient and family. After consideration of risks, benefits and other options for treatment, the patient has consented to  Procedure(s): LEFT ARM ARTERIOVENOUS (AV) FISTULA CREATION VERSUS GRAFT PLACEMENT (Left) as a surgical intervention.  The patient's history has been reviewed, patient examined, no change in status, stable for surgery.  I have reviewed the patient's chart and labs.  Questions were answered to the patient's satisfaction.     Deitra Mayo

## 2020-11-09 NOTE — Progress Notes (Signed)
Out pt HD arrangements: Have been requested to arrange HD for pt. Have submitted med recs to [facility] admissions. Please advise of d/c date [facility] [phone]

## 2020-11-09 NOTE — Anesthesia Preprocedure Evaluation (Addendum)
Anesthesia Evaluation  Patient identified by MRN, date of birth, ID band Patient awake    Reviewed: Allergy & Precautions, H&P , NPO status , Patient's Chart, lab work & pertinent test results  Airway Mallampati: II  TM Distance: >3 FB Neck ROM: Full    Dental no notable dental hx. (+) Teeth Intact, Dental Advisory Given   Pulmonary neg pulmonary ROS,    Pulmonary exam normal breath sounds clear to auscultation       Cardiovascular hypertension,  Rhythm:Regular Rate:Normal     Neuro/Psych negative neurological ROS  negative psych ROS   GI/Hepatic negative GI ROS, Neg liver ROS,   Endo/Other  negative endocrine ROS  Renal/GU CRF and DialysisRenal disease  negative genitourinary   Musculoskeletal   Abdominal   Peds  Hematology  (+) Blood dyscrasia, anemia ,   Anesthesia Other Findings   Reproductive/Obstetrics negative OB ROS                           Anesthesia Physical Anesthesia Plan  ASA: 3  Anesthesia Plan: General   Post-op Pain Management:    Induction: Intravenous  PONV Risk Score and Plan: 3 and Ondansetron, Dexamethasone and Midazolam  Airway Management Planned: LMA  Additional Equipment:   Intra-op Plan:   Post-operative Plan: Extubation in OR  Informed Consent: I have reviewed the patients History and Physical, chart, labs and discussed the procedure including the risks, benefits and alternatives for the proposed anesthesia with the patient or authorized representative who has indicated his/her understanding and acceptance.     Dental advisory given  Plan Discussed with: CRNA  Anesthesia Plan Comments:         Anesthesia Quick Evaluation

## 2020-11-09 NOTE — Op Note (Signed)
    NAME: Daniel Mccarty    MRN: [id number] DOB: 1966/01/18    DATE OF OPERATION: 11/09/2020  PREOP DIAGNOSIS:    End-stage renal disease  POSTOP DIAGNOSIS:    Same  PROCEDURE:    Left radiocephalic AV fistula  SURGEON: Judeth Cornfield. Scot Dock, MD  ASSIST: Marlinda Mike, PA  ANESTHESIA: General  EBL: Minimal  INDICATIONS:    Daniel Mccarty is a 55 y.o. male who presents for new access.  He has a functioning catheter.  FINDINGS:   3 mm forearm cephalic vein.  Good thrill at the completion of the procedure  TECHNIQUE:   The patient was taken to the operating room and received a general anesthetic.  The left arm was prepped and draped in usual sterile fashion.  The cephalic vein on exam looked reasonable in size and therefore I felt he was a candidate for radiocephalic fistula.  The vein was fairly far lateral away from the radial artery so I elected to make 2 separate incisions.  A longitudinal incision was made over the cephalic vein and the vein was dissected free over a length of about 3 to 4 cm.  Branches were divided between clips and 3-0 silk ties.  The vein was ligated distally and irrigated up with heparinized saline.  It was about a 3 mm vein.  Through a separate incision over the radial artery the radial artery was dissected free beneath the fascia.  This was controlled with a vessel loop.  The patient was heparinized.  A tunnel was created between the 2 incisions and the vein was brought through the tunnel.  The radial artery was clamped proximally and distally and a longitudinal arteriotomy was made.  The vein was spatulated and sewn end-to-side to the artery using continuous 6-0 Prolene suture.  At the completion there was an excellent thrill in the fistula.  Hemostasis was obtained in the wounds.  The heparin was partially reversed with protamine.  The wounds were each closed with a deep layer of 3-0 Vicryl and the skin closed with 4-0 Monocryl.  Dermabond was applied.  The  patient tolerated the procedure well was transferred to the recovery room in stable condition.  All needle and sponge counts were correct.  Given the complexity of the case a first assistant was necessary in order to expedient the procedure and safely perform the technical aspects of the operation.  Deitra Mayo, MD, FACS [facility]  DATE OF DICTATION:   11/09/2020

## 2020-11-09 NOTE — Progress Notes (Signed)
PROGRESS NOTE    Daniel Mccarty  XBD:[id number] DOB: Jan 10, 1966 DOA: 11/01/2020 PCP: Patient, No Pcp Per (Inactive)    Brief Narrative:  Daniel Mccarty is a 55 year old male with past medical history significant for essential hypertension, CKD stage IV, urinary retention, BPH who presented to [facility] ED on 6/2 at the recommendation of his PCP due to hypertensive urgency with SBP in the 200s.  Patient has not seen a provider for more than 2 years and has been off his antihypertensives for the last 2 years since he has had no refills.  Patient reports progressive fatigue.  In the ED, patient was noted to have a BUN of 239 with a creatinine of 33, serum bicarbonate less than 7, hemoglobin 5.8, platelets 207.  Bicarb drip.  Nephrology was consulted.  TRH consulted for further evaluation and management of hypertensive urgency, and progression of renal failure.   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  Found to have elevated BUN of 239 with a creatinine of 33.  SBP in the 200s on ED arrival.  Patient has been off antihypertensive treatment for the last 2 years due to noncompliance with outpatient visits.  Renal ultrasound with bilateral hydronephrosis with distended urinary bladder with associated renal cortical thinning and increased parenchymal echogenicity suggesting chronic cause. --Nephrology/vascular surgery following, appreciate assistance --Cr 33>>>12.96>12.95>7.98>9.06 --Vascular surgery plans AV fistula placement today --Continue HD per nephrology, received yesterday  Hypertensive urgency Patient presenting with SBP in the 200s.   Has been off of antihypertensives for 2 years due to noncompliance with medical therapy. BP 136/79 this morning --Amlodipine 5 mg p.o. daily  Obstructive uropathy BPH Ultrasound on 6/2 with marked bilateral hydronephrosis with renal cortical thinning and increased parenchymal echogenicity suggesting chronic, distended urinary bladder and mild bladder wall thickening.  Etiology likely secondary to chronic BPH causing obstruction and now superimposed medical renal disease.  Foley catheter initially placed which was discontinued on 11/05/2020; now replaced 6/8 due to recurrent high PVRs. --Tamsulosin 0.4 mg p.o. daily --Finasteride 5 mg p.o. daily --continue foley catheter, will need follow-up outpatient  Anemia of chronic renal disease Hemoglobin 5.8 on admission.  Transfuse 2 unit PRBC on 6/2 and 1 unit PRBC on 6/6.  Iron 123, TIBC 206, ferritin 374, folate 7.1, vitamin B12 1063. --Hgb 5.8>>>7.7>8.0>7.1 --Transfuse for hemoglobin less than 7.0  Hypocalcemia Received 2 g calcium gluconate on 6/2 and 1 g calcium gluconate on 6/6. --Calcium 6.6 with albumin 3.1, corrected for hypoalbuminemia calcium 7.3.  Moderate protein calorie malnutrition: Body mass index is 24.54 kg/m. Nutrition Status: Nutrition Problem: Moderate Malnutrition Etiology: chronic illness (CKD) Signs/Symptoms: mild muscle depletion, mild fat depletion Interventions: Boost Breeze  --Dietitian following, appreciate assistance --Continue to encourage increased oral intake, supplements     DVT prophylaxis: heparin 6,000 Units in sodium chloride 0.9 % 500 mL irrigation Start: 11/09/20 1206 heparin injection 5,000 Units Start: 11/01/20 2200 SCDs Start: 11/01/20 1941    Code Status: Full Code Family Communication: None present at bedside this morning  Disposition Plan:  Level of care: Med-Surg Status is: Inpatient  Remains inpatient appropriate because:Ongoing diagnostic testing needed not appropriate for  outpatient  work up, Unsafe d/c plan, IV treatments appropriate due to intensity of illness or inability to take PO and Inpatient level of care appropriate due to severity of illness   Dispo:  Patient From: Home  Planned Disposition: Home  Medically stable for discharge: No       Consultants:  Nephrology Vascular surgery Interventional radiology  Procedures:  HD Catheter placement, IR 6/3 Foley catheter placement 6/8  Antimicrobials:  None   Subjective: Patient seen and examined at bedside, resting comfortably.  Sitting in bedside chair.  Awaiting surgical procedure for AV fistula placement today.  No other complaints or concerns at this time. Denies headache, no dizziness, no fever/chills/night sweats, no nausea/vomiting/diarrhea, no chest pain, no palpitations, no shortness of breath, no abdominal pain, no weakness, no fatigue, no paresthesias.  No acute events overnight per nursing staff.  Objective: Vitals:   11/08/20 2037 11/09/20 0416 11/09/20 0514 11/09/20 1038  BP: 116/72  136/79 131/76  Pulse: 95  87 80  Resp: 16  15 17   Temp: 99.4 F (37.4 C)  98.9 F (37.2 C) 98.3 F (36.8 C)  TempSrc: Oral  Oral Oral  SpO2: 99%  99% 99%  Weight:  83.6 kg 84.4 kg   Height:        Intake/Output Summary (Last 24 hours) at 11/09/2020 1348 Last data filed at 11/09/2020 0519 Gross per 24 hour  Intake 740 ml  Output 350 ml  Net 390 ml   Filed Weights   11/08/20 0500 11/09/20 0416 11/09/20 0514  Weight: 83.6 kg 83.6 kg 84.4 kg    Examination:  General exam: Appears calm and comfortable  Respiratory system: Clear to auscultation. Respiratory effort normal.  On room air Cardiovascular system: S1 & S2 heard, RRR. No JVD, murmurs, rubs, gallops or clicks. No pedal edema.  Tunneled HD catheter noted right chest. Gastrointestinal system: Abdomen is nondistended, soft and nontender. No organomegaly or masses felt. Normal bowel sounds heard. Central nervous system: Alert and oriented. No  focal neurological deficits. Extremities: Symmetric 5 x 5 power. Skin: No rashes, lesions or ulcers Psychiatry: Judgement and insight appear normal. Mood & affect appropriate.     Data Reviewed: I have personally reviewed following labs and imaging studies  CBC: Recent Labs  Lab 11/03/20 1439 11/04/20 0246 11/05/20 0720 11/06/20 0540 11/07/20 0317 11/09/20 0423  WBC 8.3 7.7  --  9.3 10.9* 6.7  NEUTROABS  --   --   --  7.6  --   --   HGB 7.0* 7.2* 7.0* 7.7* 8.0* 7.1*  HCT 19.9* 21.1* 21.3* 23.2* 24.0* 22.3*  MCV 79.6* 81.8  --  85.0 84.5 87.8  PLT 155 146*  --  161 195 557   Basic Metabolic Panel: Recent Labs  Lab 11/03/20 0318 11/03/20 1440 11/04/20 0246 11/05/20 0720 11/06/20 0540 11/07/20 0317 11/08/20 0350 11/09/20 0423  NA 134* 136   < > 134* 133* 133* 135 136  K 2.7* 3.3*   < > 3.4* 3.4* 4.0 3.9 4.3  CL 91* 91*   < > 99 97* 99 99 101  CO2 23 26   < > 22 21* 19* 25 22  GLUCOSE 153* 121*   < > 129* 123* 108* 102* 100*  BUN 145* 137*   < > 85* 94* 95* 51* 62*  CREATININE 19.53* 19.54*   < > 13.11* 12.96* 12.95* 7.98* 9.06*  CALCIUM 6.0* 5.8*   < > 6.3* 6.4* 6.6* 7.4* 7.5*  MG  --   --   --   --   --  1.5*  --   --   PHOS 6.2* 7.1*  --   --   --  4.4 3.9 5.0*   < > = values in this interval not displayed.   GFR: Estimated Creatinine Clearance: 10.5 mL/min (A) (by C-G formula based on SCr of 9.06 mg/dL (H)). Liver Function Tests: Recent Labs  Lab 11/03/20 0318 11/03/20 1440 11/07/20 0317 11/08/20 0350 11/09/20 0423  ALBUMIN 3.3* 3.3* 3.1* 2.7* 2.7*   No results for input(s): LIPASE, AMYLASE in the last 168 hours. No results for input(s): AMMONIA in the last 168 hours. Coagulation Profile: No results for input(s): INR, PROTIME in the last 168 hours. Cardiac Enzymes: No results for input(s): CKTOTAL, CKMB, CKMBINDEX, TROPONINI in the last 168 hours. BNP (last 3 results) No results for input(s): PROBNP in the last 8760 hours. HbA1C: No results for  input(s): HGBA1C in the last 72 hours. CBG: No results for input(s): GLUCAP in the last 168 hours. Lipid Profile: No results for input(s): CHOL, HDL, LDLCALC, TRIG, CHOLHDL, LDLDIRECT in the last 72 hours. Thyroid Function Tests: No results for input(s): TSH, T4TOTAL, FREET4, T3FREE, THYROIDAB in the last 72 hours. Anemia Panel: No results for input(s): VITAMINB12, FOLATE, FERRITIN, TIBC, IRON, RETICCTPCT in the last 72 hours. Sepsis Labs: No results for input(s): PROCALCITON, LATICACIDVEN in the last 168 hours.  Recent Results (from the past 240 hour(s))  Culture, Urine     Status: None   Collection Time: 11/01/20  8:05 PM   Specimen: Urine, Random  Result Value Ref Range Status   Specimen Description URINE, RANDOM  Final   Special Requests NONE  Final   Culture   Final    NO GROWTH Performed at [facility], [address] [place], [place] [address]    Report Status 11/03/2020 FINAL  Final  Resp Panel by RT-PCR (Flu A&B, Covid) Nasopharyngeal Swab     Status: None   Collection Time: 11/01/20  8:24 PM   Specimen: Nasopharyngeal Swab; Nasopharyngeal(NP) swabs in vial transport medium  Result Value Ref Range Status   SARS Coronavirus 2 by RT PCR NEGATIVE NEGATIVE Final    Comment: (NOTE) SARS-CoV-2 target nucleic acids are NOT DETECTED.  The SARS-CoV-2 RNA is generally detectable in upper respiratory specimens during the acute phase of infection. The lowest concentration of SARS-CoV-2 viral copies this assay can detect is 138 copies/mL. A negative result does not preclude SARS-Cov-2 infection and should not be used as the sole basis for treatment or other patient management decisions. A negative result may occur with  improper specimen collection/handling, submission of specimen other than nasopharyngeal swab, presence of viral mutation(s) within the areas targeted by this assay, and inadequate number of viral copies(<138 copies/mL). A negative result must be combined  with clinical observations, patient history, and epidemiological information. The expected result is Negative.  Fact Sheet for Patients:  EntrepreneurPulse.com.au  Fact Sheet for Healthcare Providers:  IncredibleEmployment.be  This test is no t yet approved or cleared by the [place] FDA and  has been authorized for detection and/or diagnosis of SARS-CoV-2 by FDA under an Emergency Use Authorization (EUA). This EUA will remain  in effect (meaning this test can be used) for the duration of the COVID-19 declaration under Section 564(b)(1) of the Act, 21 U.S.C.section 360bbb-3(b)(1), unless the authorization is terminated  or revoked sooner.       Influenza A by PCR NEGATIVE NEGATIVE Final   Influenza B by PCR NEGATIVE NEGATIVE Final    Comment: (  NOTE) The Xpert Xpress SARS-CoV-2/FLU/RSV plus assay is intended as an aid in the diagnosis of influenza from Nasopharyngeal swab specimens and should not be used as a sole basis for treatment. Nasal washings and aspirates are unacceptable for Xpert Xpress SARS-CoV-2/FLU/RSV testing.  Fact Sheet for Patients: EntrepreneurPulse.com.au  Fact Sheet for Healthcare Providers: IncredibleEmployment.be  This test is not yet approved or cleared by the [place] FDA and has been authorized for detection and/or diagnosis of SARS-CoV-2 by FDA under an Emergency Use Authorization (EUA). This EUA will remain in effect (meaning this test can be used) for the duration of the COVID-19 declaration under Section 564(b)(1) of the Act, 21 U.S.C. section 360bbb-3(b)(1), unless the authorization is terminated or revoked.  Performed at [facility], [place] [address] [place], [place] [address]          Radiology Studies: No results found.       Scheduled Meds:  acetaminophen  1,000 mg Oral Once   [MAR Hold] amLODipine  5 mg Oral Daily   [MAR Hold]  calcitRIOL  0.25 mcg Oral Daily   [MAR Hold] calcium acetate (Phos Binder)  1,334 mg Oral TID WC   [MAR Hold] Chlorhexidine Gluconate Cloth  6 each Topical Daily   [MAR Hold] COVID-19 mRNA vaccine (Moderna)  0.5 mL Intramuscular Once   [MAR Hold] darbepoetin (ARANESP) injection - NON-DIALYSIS  60 mcg Subcutaneous Q Tue-1800   [MAR Hold] feeding supplement  1 Container Oral TID BM   [MAR Hold] finasteride  5 mg Oral Daily   [MAR Hold] heparin  5,000 Units Subcutaneous Q8H   [MAR Hold] ondansetron  4 mg Oral Once   [MAR Hold] senna-docusate  1 tablet Oral QHS   [MAR Hold] tamsulosin  0.4 mg Oral QPC supper   Continuous Infusions:  sodium chloride 10 mL/hr at 11/09/20 1216   ceFAZolin       LOS: 8 days    Time spent: 38 minutes spent on chart review, discussion with nursing staff, consultants, updating family and interview/physical exam; more than 50% of that time was spent in counseling and/or coordination of care.    Lailana Shira J [place] DO [facility] Available via Epic secure chat 7am-7pm After these hours, please refer to coverage provider listed on [email or web address] 11/09/2020, 1:48 PM

## 2020-11-09 NOTE — Progress Notes (Addendum)
CSW spoke with Marya Amsler Renal Navigator who confirmed he is following patient. TOC will follow up with Colleen Renal Navigator on Monday with HD plans.

## 2020-11-09 NOTE — Anesthesia Postprocedure Evaluation (Signed)
Anesthesia Post Note  Patient: Marius Betts  Procedure(s) Performed: CREATION OF  LEFT ARM  RADIO- CEPHALIC  ARTERIOVENOUS (AV) FISTULA (Left)     Patient location during evaluation: PACU Anesthesia Type: General Level of consciousness: awake and alert Pain management: pain level controlled Vital Signs Assessment: post-procedure vital signs reviewed and stable Respiratory status: spontaneous breathing, nonlabored ventilation and respiratory function stable Cardiovascular status: blood pressure returned to baseline and stable Postop Assessment: no apparent nausea or vomiting Anesthetic complications: no   No notable events documented.  Last Vitals:  Vitals:   11/09/20 1425 11/09/20 1440  BP: 110/80 107/88  Pulse: 75 71  Resp: 15 13  Temp:  36.7 C  SpO2: 100% 97%    Last Pain:  Vitals:   11/09/20 1440  TempSrc:   PainSc: 0-No pain                 Tanzie Rothschild,W. EDMOND

## 2020-11-09 NOTE — Transfer of Care (Signed)
Immediate Anesthesia Transfer of Care Note  Patient: Daniel Mccarty  Procedure(s) Performed: CREATION OF  LEFT ARM  RADIO- CEPHALIC  ARTERIOVENOUS (AV) FISTULA (Left)  Patient Location: PACU  Anesthesia Type:General  Level of Consciousness: awake and patient cooperative  Airway & Oxygen Therapy: Patient Spontanous Breathing and Patient connected to nasal cannula oxygen  Post-op Assessment: Report given to RN, Post -op Vital signs reviewed and stable and Patient moving all extremities  Post vital signs: Reviewed and stable  Last Vitals:  Vitals Value Taken Time  BP 117/77 11/09/20 1410  Temp    Pulse 80 11/09/20 1410  Resp 12 11/09/20 1410  SpO2 100 % 11/09/20 1410  Vitals shown include unvalidated device data.  Last Pain:  Vitals:   11/09/20 1038  TempSrc: Oral  PainSc:       Patients Stated Pain Goal: 0 (63/14/97 0263)  Complications: No notable events documented.

## 2020-11-10 ENCOUNTER — Encounter (HOSPITAL_COMMUNITY): Payer: Self-pay | Admitting: Vascular Surgery

## 2020-11-10 LAB — CBC
HCT: 20.5 % — ABNORMAL LOW (ref 39.0–52.0)
HCT: 26.7 % — ABNORMAL LOW (ref 39.0–52.0)
Hemoglobin: 6.6 g/dL — CL (ref 13.0–17.0)
Hemoglobin: 8.5 g/dL — ABNORMAL LOW (ref 13.0–17.0)
MCH: 28.2 pg (ref 26.0–34.0)
MCH: 28.6 pg (ref 26.0–34.0)
MCHC: 32.2 g/dL (ref 30.0–36.0)
MCHC: 31.8 g/dL (ref 30.0–36.0)
MCV: 87.6 fL (ref 80.0–100.0)
MCV: 89.9 fL (ref 80.0–100.0)
Platelets: 219 10*3/uL (ref 150–400)
Platelets: 270 10*3/uL (ref 150–400)
RBC: 2.34 MIL/uL — ABNORMAL LOW (ref 4.22–5.81)
RBC: 2.97 MIL/uL — ABNORMAL LOW (ref 4.22–5.81)
RDW: 12.7 % (ref 11.5–15.5)
RDW: 13.1 % (ref 11.5–15.5)
WBC: 6.9 10*3/uL (ref 4.0–10.5)
WBC: 8.2 10*3/uL (ref 4.0–10.5)
nRBC: 0 % (ref 0.0–0.2)
nRBC: 0 % (ref 0.0–0.2)

## 2020-11-10 LAB — PREPARE RBC (CROSSMATCH)

## 2020-11-10 LAB — RENAL FUNCTION PANEL
Albumin: 2.5 g/dL — ABNORMAL LOW (ref 3.5–5.0)
Anion gap: 13 (ref 5–15)
BUN: 70 mg/dL — ABNORMAL HIGH (ref 6–20)
CO2: 20 mmol/L — ABNORMAL LOW (ref 22–32)
Calcium: 7.9 mg/dL — ABNORMAL LOW (ref 8.9–10.3)
Chloride: 102 mmol/L (ref 98–111)
Creatinine, Ser: 10.2 mg/dL — ABNORMAL HIGH (ref 0.61–1.24)
GFR, Estimated: 6 mL/min — ABNORMAL LOW (ref >60)
Glucose, Bld: 165 mg/dL — ABNORMAL HIGH (ref 70–99)
Phosphorus: 3.8 mg/dL (ref 2.5–4.6)
Potassium: 4.4 mmol/L (ref 3.5–5.1)
Sodium: 135 mmol/L (ref 135–145)

## 2020-11-10 MED ORDER — HEPARIN SODIUM (PORCINE) 1000 UNIT/ML DIALYSIS: 20.0000 [IU]/kg | INTRAMUSCULAR | Status: DC | PRN

## 2020-11-10 MED ORDER — SODIUM CHLORIDE 0.9% IV SOLUTION: Freq: Once | INTRAVENOUS | Status: DC

## 2020-11-10 NOTE — Progress Notes (Signed)
PROGRESS NOTE    Daniel Mccarty  HBZ:[id number] DOB: Oct 18, 1965 DOA: 11/01/2020 PCP: Patient, No Pcp Per (Inactive)    Brief Narrative:  Daniel Mccarty is a 55 year old male with past medical history significant for essential hypertension, CKD stage IV, urinary retention, BPH who presented to [facility] ED on 6/2 at the recommendation of his PCP due to hypertensive urgency with SBP in the 200s.  Patient has not seen a provider for more than 2 years and has been off his antihypertensives for the last 2 years since he has had no refills.  Patient reports progressive fatigue.  In the ED, patient was noted to have a BUN of 239 with a creatinine of 33, serum bicarbonate less than 7, hemoglobin 5.8, platelets 207.  Bicarb drip.  Nephrology was consulted.  TRH consulted for further evaluation and management of hypertensive urgency, and progression of renal failure.   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  Found to have elevated BUN of 239 with a creatinine of 33.  SBP in the 200s on ED arrival.  Patient has been off antihypertensive treatment for the last 2 years due to noncompliance with outpatient visits.  Renal ultrasound with bilateral hydronephrosis with distended urinary bladder with associated renal cortical thinning and increased parenchymal echogenicity suggesting chronic cause.  Patient has left radiocephalic fistula placed by vascular surgery, Dr. Scot Dock on 11/09/2020. --Nephrology/vascular surgery following, appreciate assistance --Cr 33>>>12.96>12.95>7.98>9.06>10.20 --Continue HD per nephrology, plan today --Outpatient follow-up with  vascular surgery for fistula duplex 4-6 weeks --Will need outpatient HD placement prior to discharge  Hypertensive urgency: Resolved Patient presenting with SBP in the 200s.  Has been off of antihypertensives for 2 years due to noncompliance with medical therapy. BP 105/66 this morning --Amlodipine 5 mg p.o. daily  Obstructive uropathy BPH Ultrasound on 6/2 with marked bilateral hydronephrosis with renal cortical thinning and increased parenchymal echogenicity suggesting chronic, distended urinary bladder and mild bladder wall thickening.  Etiology likely secondary to chronic BPH causing obstruction and now superimposed medical renal disease.  Foley catheter initially placed which was discontinued on 11/05/2020; now replaced 6/8 due to recurrent high PVRs. --Tamsulosin 0.4 mg p.o. daily --Finasteride 5 mg p.o. daily --continue foley catheter, will need follow-up outpatient  Anemia of chronic renal disease Hemoglobin 5.8 on admission.  Transfuse 2 unit PRBC on 6/2 and 1 unit PRBC on 6/6.  Iron 123, TIBC 206, ferritin 374, folate 7.1, vitamin B12 1063. --Hgb 5.8>>>7.7>8.0>7.1>6.6>8.5 --Transfuse 1 unit PRBC today --Transfuse for hemoglobin less than 7.0  Hypocalcemia Received 2 g calcium gluconate on 6/2 and 1 g calcium gluconate on 6/6. --Calcium 6.6 with albumin 3.1, corrected for hypoalbuminemia calcium 7.3.  Moderate protein calorie malnutrition: Body mass index is 24.75 kg/m. Nutrition Status: Nutrition Problem: Moderate Malnutrition Etiology: chronic illness (CKD) Signs/Symptoms: mild muscle depletion, mild fat depletion Interventions: Boost Breeze  --Dietitian following, appreciate assistance --Continue to encourage increased oral intake, supplements     DVT prophylaxis: heparin injection 5,000 Units Start: 11/01/20 2200 SCDs Start: 11/01/20 1941   Code Status: Full Code Family Communication: None present at bedside this morning  Disposition Plan:  Level of  care:  Med-Surg Status is: Inpatient  Remains inpatient appropriate because:Ongoing diagnostic testing needed not appropriate for outpatient work up, Unsafe d/c plan, IV treatments appropriate due to intensity of illness or inability to take PO and Inpatient level of care appropriate due to severity of illness   Dispo:  Patient From: Home  Planned Disposition: Home  Medically stable for discharge: No       Consultants:  Nephrology Vascular surgery Interventional radiology  Procedures:  HD Catheter placement, IR 6/3 Foley catheter placement 6/8  Antimicrobials:  None   Subjective: Patient seen and examined at bedside, resting comfortably.  Lying in bed watching TV.  Hemoglobin dropped to 6.6 overnight, getting 1 unit of blood this morning.  No specific complaints. Denies headache, no dizziness, no fever/chills/night sweats, no nausea/vomiting/diarrhea, no chest pain, no palpitations, no shortness of breath, no abdominal pain, no weakness, no fatigue, no paresthesias.  No other acute events overnight per nursing staff.  Objective: Vitals:   11/10/20 0549 11/10/20 0605 11/10/20 0750 11/10/20 0751  BP: 103/69 105/64 116/69   Pulse: 80 79  78  Resp: 16 12  18   Temp: 98.9 F (37.2 C) 98.7 F (37.1 C) 98.6 F (37 C) 98.6 F (37 C)  TempSrc: Oral Oral Oral Oral  SpO2: 99% 100%    Weight:      Height:        Intake/Output Summary (Last 24 hours) at 11/10/2020 1341 Last data filed at 11/10/2020 0751 Gross per 24 hour  Intake 545.33 ml  Output 1130 ml  Net -584.67 ml   Filed Weights   11/09/20 0416 11/09/20 0514 11/10/20 0531  Weight: 83.6 kg 84.4 kg 85.1 kg    Examination:  General exam: Appears calm and comfortable  Respiratory system: Clear to auscultation. Respiratory effort normal.  On room air Cardiovascular system: S1 & S2 heard, RRR. No JVD, murmurs, rubs, gallops or clicks. No pedal edema.  Tunneled HD catheter noted right chest. Gastrointestinal system: Abdomen is  nondistended, soft and nontender. No organomegaly or masses felt. Normal bowel sounds heard. Central nervous system: Alert and oriented. No focal neurological deficits. Extremities: Symmetric 5 x 5 power.  Left radiocephalic fistula noted, surgical incisionsclean/dry/intact Skin: No rashes, lesions or ulcers Psychiatry: Judgement and insight appear normal. Mood & affect appropriate.     Data Reviewed: I have personally reviewed following labs and imaging studies  CBC: Recent Labs  Lab 11/06/20 0540 11/07/20 0317 11/09/20 0423 11/10/20 0244 11/10/20 0946  WBC 9.3 10.9* 6.7 6.9 8.2  NEUTROABS 7.6  --   --   --   --   HGB 7.7* 8.0* 7.1* 6.6* 8.5*  HCT 23.2* 24.0* 22.3* 20.5* 26.7*  MCV 85.0 84.5 87.8 87.6 89.9  PLT 161 195 203 219 944   Basic Metabolic Panel: Recent Labs  Lab 11/03/20 1440 11/04/20 0246 11/06/20 0540 11/07/20 0317 11/08/20 0350 11/09/20 0423 11/10/20 0244  NA 136   < > 133* 133* 135 136 135  K 3.3*   < > 3.4* 4.0 3.9 4.3 4.4  CL 91*   < > 97* 99 99 101 102  CO2 26   < > 21* 19* 25 22 20*  GLUCOSE 121*   < > 123* 108* 102* 100* 165*  BUN 137*   < > 94* 95* 51* 62* 70*  CREATININE 19.54*   < > 12.96* 12.95* 7.98* 9.06* 10.20*  CALCIUM 5.8*   < > 6.4* 6.6* 7.4* 7.5* 7.9*  MG  --   --   --  1.5*  --   --   --   PHOS 7.1*  --   --  4.4 3.9 5.0* 3.8   < > = values in this interval not displayed.   GFR: Estimated Creatinine Clearance: 9.4 mL/min (A) (by C-G formula based on SCr of 10.2 mg/dL (H)). Liver Function Tests: Recent Labs  Lab 11/03/20 1440 11/07/20 0317 11/08/20 0350 11/09/20 0423 11/10/20 0244  ALBUMIN 3.3* 3.1* 2.7* 2.7* 2.5*   No results for input(s): LIPASE, AMYLASE in the last 168 hours. No results for input(s): AMMONIA in the last 168 hours. Coagulation Profile: No results for input(s): INR, PROTIME in the last 168 hours. Cardiac Enzymes: No results for input(s): CKTOTAL, CKMB, CKMBINDEX, TROPONINI in the last 168 hours. BNP  (last 3 results) No results for input(s): PROBNP in the last 8760 hours. HbA1C: No results for input(s): HGBA1C in the last 72 hours. CBG: No results for input(s): GLUCAP in the last 168 hours. Lipid Profile: No results for input(s): CHOL, HDL, LDLCALC, TRIG, CHOLHDL, LDLDIRECT in the last 72 hours. Thyroid Function Tests: No results for input(s): TSH, T4TOTAL, FREET4, T3FREE, THYROIDAB in the last 72 hours. Anemia Panel: No results for input(s): VITAMINB12, FOLATE, FERRITIN, TIBC, IRON, RETICCTPCT in the last 72 hours. Sepsis Labs: No results for input(s): PROCALCITON, LATICACIDVEN in the last 168 hours.  Recent Results (from the past 240 hour(s))  Culture, Urine     Status: None   Collection Time: 11/01/20  8:05 PM   Specimen: Urine, Random  Result Value Ref Range Status   Specimen Description URINE, RANDOM  Final   Special Requests NONE  Final   Culture   Final    NO GROWTH Performed at [facility], [address] [place], [place] [address]    Report Status 11/03/2020 FINAL  Final  Resp Panel by RT-PCR (Flu A&B, Covid) Nasopharyngeal Swab     Status: None   Collection Time: 11/01/20  8:24 PM   Specimen: Nasopharyngeal Swab; Nasopharyngeal(NP) swabs in vial transport medium  Result Value Ref Range Status   SARS Coronavirus 2 by RT PCR NEGATIVE NEGATIVE Final    Comment: (NOTE) SARS-CoV-2 target nucleic acids are NOT DETECTED.  The SARS-CoV-2 RNA is generally detectable in upper respiratory specimens during the acute phase of infection. The lowest concentration of SARS-CoV-2 viral copies this assay can detect is 138 copies/mL. A negative result does not preclude SARS-Cov-2 infection and should not be used as the sole basis for treatment or other patient management decisions. A negative result may occur with  improper specimen collection/handling, submission of specimen other than nasopharyngeal swab, presence of viral mutation(s) within the areas targeted by this  assay, and inadequate number of viral copies(<138 copies/mL). A negative result must be combined with clinical observations, patient history, and epidemiological information. The expected result is Negative.  Fact Sheet for Patients:  EntrepreneurPulse.com.au  Fact Sheet for Healthcare Providers:  IncredibleEmployment.be  This test is no t yet approved or cleared by the [place] FDA and  has been authorized for detection and/or diagnosis of SARS-CoV-2 by FDA under an Emergency Use Authorization (EUA). This EUA will remain  in effect (meaning this test can be used) for the duration of the COVID-19 declaration under Section 564(b)(1) of the Act, 21 U.S.C.section 360bbb-3(b)(1), unless the authorization is terminated  or revoked sooner.       Influenza A by PCR NEGATIVE NEGATIVE Final   Influenza B by PCR NEGATIVE NEGATIVE Final    Comment: (  NOTE) The Xpert Xpress SARS-CoV-2/FLU/RSV plus assay is intended as an aid in the diagnosis of influenza from Nasopharyngeal swab specimens and should not be used as a sole basis for treatment. Nasal washings and aspirates are unacceptable for Xpert Xpress SARS-CoV-2/FLU/RSV testing.  Fact Sheet for Patients: EntrepreneurPulse.com.au  Fact Sheet for Healthcare Providers: IncredibleEmployment.be  This test is not yet approved or cleared by the [place] FDA and has been authorized for detection and/or diagnosis of SARS-CoV-2 by FDA under an Emergency Use Authorization (EUA). This EUA will remain in effect (meaning this test can be used) for the duration of the COVID-19 declaration under Section 564(b)(1) of the Act, 21 U.S.C. section 360bbb-3(b)(1), unless the authorization is terminated or revoked.  Performed at [facility], [address] [place], [place] [address]          Radiology Studies: No results found.       Scheduled Meds:   sodium chloride   Intravenous Once   amLODipine  5 mg Oral Daily   calcitRIOL  0.25 mcg Oral Daily   calcium acetate (Phos Binder)  1,334 mg Oral TID WC   Chlorhexidine Gluconate Cloth  6 each Topical Daily   COVID-19 mRNA vaccine (Moderna)  0.5 mL Intramuscular Once   darbepoetin (ARANESP) injection - NON-DIALYSIS  60 mcg Subcutaneous Q Tue-1800   feeding supplement  1 Container Oral TID BM   finasteride  5 mg Oral Daily   heparin  5,000 Units Subcutaneous Q8H   ondansetron  4 mg Oral Once   senna-docusate  1 tablet Oral QHS   tamsulosin  0.4 mg Oral QPC supper   Continuous Infusions:     LOS: 9 days    Time spent: 38 minutes spent on chart review, discussion with nursing staff, consultants, updating family and interview/physical exam; more than 50% of that time was spent in counseling and/or coordination of care.    Rishav Rockefeller J [place] DO [facility] Available via Epic secure chat 7am-7pm After these hours, please refer to coverage provider listed on [email or web address] 11/10/2020, 1:41 PM

## 2020-11-10 NOTE — Progress Notes (Signed)
Pt Hb is 6.6. . Dr. Marlowe Sax paged.

## 2020-11-10 NOTE — Progress Notes (Signed)
OT Cancellation Note  Patient Details Name: Daniel Mccarty MRN: [id number] DOB: 20-Aug-1965   Cancelled Treatment:    Reason Eval/Treat Not Completed: Patient at procedure or test/ unavailable (Pt off of floor for HD, will f/u at time allows.)  Aldona Bar A Oliverio Cho 11/10/2020, 2:11 PM

## 2020-11-10 NOTE — Progress Notes (Signed)
Patient ID: Daniel Mccarty, male   DOB: [birth year], 55 y.o.   MRN: [id number] S: Feels well, no complaints O:BP 116/69 (BP Location: Right Arm)   Pulse 78   Temp 98.6 F (37 C) (Oral)   Resp 18   Ht 6\' 1"  (1.854 m)   Wt 85.1 kg   SpO2 100%   BMI 24.75 kg/m   Intake/Output Summary (Last 24 hours) at 11/10/2020 1249 Last data filed at 11/10/2020 0751 Gross per 24 hour  Intake 545.33 ml  Output 1130 ml  Net -584.67 ml   Intake/Output: I/O last 3 completed shifts: In: 4098 [P.O.:740; I.V.:300] Out: 1480 [Urine:1450; Blood:30]  Intake/Output this shift:  Total I/O In: 245.3 [Blood:245.3] Out: -  Weight change: 1.495 kg Gen: NAD CVS: RRR Resp: CTA Abd: benign Ext: no edema, LAVF +T/B  Recent Labs  Lab 11/03/20 1440 11/04/20 0246 11/05/20 0720 11/06/20 0540 11/07/20 0317 11/08/20 0350 11/09/20 0423 11/10/20 0244  NA 136 136 134* 133* 133* 135 136 135  K 3.3* 3.6 3.4* 3.4* 4.0 3.9 4.3 4.4  CL 91* 98 99 97* 99 99 101 102  CO2 26 25 22  21* 19* 25 22 20*  GLUCOSE 121* 114* 129* 123* 108* 102* 100* 165*  BUN 137* 75* 85* 94* 95* 51* 62* 70*  CREATININE 19.54* 12.57* 13.11* 12.96* 12.95* 7.98* 9.06* 10.20*  ALBUMIN 3.3*  --   --   --  3.1* 2.7* 2.7* 2.5*  CALCIUM 5.8* 6.5* 6.3* 6.4* 6.6* 7.4* 7.5* 7.9*  PHOS 7.1*  --   --   --  4.4 3.9 5.0* 3.8   Liver Function Tests: Recent Labs  Lab 11/08/20 0350 11/09/20 0423 11/10/20 0244  ALBUMIN 2.7* 2.7* 2.5*   No results for input(s): LIPASE, AMYLASE in the last 168 hours. No results for input(s): AMMONIA in the last 168 hours. CBC: Recent Labs  Lab 11/06/20 0540 11/07/20 0317 11/09/20 0423 11/10/20 0244 11/10/20 0946  WBC 9.3 10.9* 6.7 6.9 8.2  NEUTROABS 7.6  --   --   --   --   HGB 7.7* 8.0* 7.1* 6.6* 8.5*  HCT 23.2* 24.0* 22.3* 20.5* 26.7*  MCV 85.0 84.5 87.8 87.6 89.9  PLT 161 195 203 219 270   Cardiac Enzymes: No results for input(s): CKTOTAL, CKMB, CKMBINDEX, TROPONINI in the last 168 hours. CBG: No  results for input(s): GLUCAP in the last 168 hours.  Iron Studies: No results for input(s): IRON, TIBC, TRANSFERRIN, FERRITIN in the last 72 hours. Studies/Results: No results found.  sodium chloride   Intravenous Once   amLODipine  5 mg Oral Daily   calcitRIOL  0.25 mcg Oral Daily   calcium acetate (Phos Binder)  1,334 mg Oral TID WC   Chlorhexidine Gluconate Cloth  6 each Topical Daily   COVID-19 mRNA vaccine (Moderna)  0.5 mL Intramuscular Once   darbepoetin (ARANESP) injection - NON-DIALYSIS  60 mcg Subcutaneous Q Tue-1800   feeding supplement  1 Container Oral TID BM   finasteride  5 mg Oral Daily   heparin  5,000 Units Subcutaneous Q8H   ondansetron  4 mg Oral Once   senna-docusate  1 tablet Oral QHS   tamsulosin  0.4 mg Oral QPC supper    BMET    Component Value Date/Time   NA 135 11/10/2020 0244   K 4.4 11/10/2020 0244   CL 102 11/10/2020 0244   CO2 20 (L) 11/10/2020 0244   GLUCOSE 165 (H) 11/10/2020 0244   BUN 70 (H) 11/10/2020 0244  CREATININE 10.20 (H) 11/10/2020 0244   CALCIUM 7.9 (L) 11/10/2020 0244   GFRNONAA 6 (L) 11/10/2020 0244   GFRAA 25 (L) 08/15/2018 0728   CBC    Component Value Date/Time   WBC 8.2 11/10/2020 0946   RBC 2.97 (L) 11/10/2020 0946   HGB 8.5 (L) 11/10/2020 0946   HCT 26.7 (L) 11/10/2020 0946   PLT 270 11/10/2020 0946   MCV 89.9 11/10/2020 0946   MCH 28.6 11/10/2020 0946   MCHC 31.8 11/10/2020 0946   RDW 13.1 11/10/2020 0946   LYMPHSABS 0.4 (L) 11/06/2020 0540   MONOABS 1.0 11/06/2020 0540   EOSABS 0.1 11/06/2020 0540   BASOSABS 0.0 11/06/2020 0540     Assessment/Plan:   Non-oliguric, 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  Renal US with marked bilateral hydronephrosis.  Significant improvement with Foley catheter, but still with significantly elevated BUN/Cr and s/p HD x 2.  Last HD session 11/03/20.   Scr stable since last HD but BUN slowly  climbing.   He is without uremic symptoms but has had rising BUN/Cr between HD sessions. VVS consulted and had left radiocephalic AVF creation 8/63/81 by Dr. Scot Dock.  Good thrill and bruit. Good UOP with foley. Plan for HD today and await outpatient HD spot as he likely has progressed to ESRD.   Urinary obstruction - bilateral hydronephrosis, present since 2020 but progressed since then.  Likely chronic.  Foley catheter removed today and will need to follow PVR.  He may benefit from urology evaluation if remains an issue.  Currently on finasteride and tamsulosin. foley catheter replaced due to elevated PVR's. Consider urology evaluation for recommendations. 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  Receiving IV calcium and started on calcium acetate binders qac.  PTH elevated at 406 and will start calcitriol. Anemia of CKD stage IV-V:  S/p blood transfusion and now on ESA. Vascular access:  Had RIJ [place] placed 11/02/20 and left radiocephalic avf creation 7/71/16 by Dr. Scot Dock. Hypertensive urgency:  Improved BP with meds, had not taken any for the past 2 years.   Donetta Potts, MD [employer] [phone]

## 2020-11-10 NOTE — Progress Notes (Addendum)
  Progress Note    11/10/2020 9:29 AM 1 Day Post-Op  Subjective:  denies signs or symptoms of steal in L hand   Vitals:   11/10/20 0750 11/10/20 0751  BP: 116/69   Pulse:  78  Resp:  18  Temp: 98.6 F (37 C) 98.6 F (37 C)  SpO2:     Physical Exam: Lungs:  non labored Incisions:  L wrist incisions c/d/i Extremities:  soft thrill L radiocephalic fistula Neurologic: A&O  CBC    Component Value Date/Time   WBC 6.9 11/10/2020 0244   RBC 2.34 (L) 11/10/2020 0244   HGB 6.6 (LL) 11/10/2020 0244   HCT 20.5 (L) 11/10/2020 0244   PLT 219 11/10/2020 0244   MCV 87.6 11/10/2020 0244   MCH 28.2 11/10/2020 0244   MCHC 32.2 11/10/2020 0244   RDW 12.7 11/10/2020 0244   LYMPHSABS 0.4 (L) 11/06/2020 0540   MONOABS 1.0 11/06/2020 0540   EOSABS 0.1 11/06/2020 0540   BASOSABS 0.0 11/06/2020 0540    BMET    Component Value Date/Time   NA 135 11/10/2020 0244   K 4.4 11/10/2020 0244   CL 102 11/10/2020 0244   CO2 20 (L) 11/10/2020 0244   GLUCOSE 165 (H) 11/10/2020 0244   BUN 70 (H) 11/10/2020 0244   CREATININE 10.20 (H) 11/10/2020 0244   CALCIUM 7.9 (L) 11/10/2020 0244   GFRNONAA 6 (L) 11/10/2020 0244   GFRAA 25 (L) 08/15/2018 0728    INR No results found for: INR   Intake/Output Summary (Last 24 hours) at 11/10/2020 0929 Last data filed at 11/10/2020 0751 Gross per 24 hour  Intake 545.33 ml  Output 1130 ml  Net -584.67 ml     Assessment/Plan:  55 y.o. male is s/p L radiocephalic fistula 1 Day Post-Op   Patent L radiocephalic fistula without steal symptoms Incisions are unremarkable Ok for discharge from vascular standpoint; office will arrange fistula duplex in 4-6 weeks   Dagoberto Ligas, PA-C [facility] [phone] 11/10/2020 9:29 AM  VASCULAR STAFF ADDENDUM: I have independently interviewed and examined the patient. I agree with the above.   Yevonne Aline. Stanford Breed, MD [facility] Phone Number:  [phone] 11/10/2020 10:38 AM

## 2020-11-11 LAB — CBC
HCT: 23.3 % — ABNORMAL LOW (ref 39.0–52.0)
Hemoglobin: 7.4 g/dL — ABNORMAL LOW (ref 13.0–17.0)
MCH: 28.6 pg (ref 26.0–34.0)
MCHC: 31.8 g/dL (ref 30.0–36.0)
MCV: 90 fL (ref 80.0–100.0)
Platelets: 229 10*3/uL (ref 150–400)
RBC: 2.59 MIL/uL — ABNORMAL LOW (ref 4.22–5.81)
RDW: 13.1 % (ref 11.5–15.5)
WBC: 5.7 10*3/uL (ref 4.0–10.5)
nRBC: 0 % (ref 0.0–0.2)

## 2020-11-11 LAB — RENAL FUNCTION PANEL
Albumin: 2.6 g/dL — ABNORMAL LOW (ref 3.5–5.0)
Anion gap: 7 (ref 5–15)
BUN: 32 mg/dL — ABNORMAL HIGH (ref 6–20)
CO2: 28 mmol/L (ref 22–32)
Calcium: 8.2 mg/dL — ABNORMAL LOW (ref 8.9–10.3)
Chloride: 100 mmol/L (ref 98–111)
Creatinine, Ser: 6.31 mg/dL — ABNORMAL HIGH (ref 0.61–1.24)
GFR, Estimated: 10 mL/min — ABNORMAL LOW (ref >60)
Glucose, Bld: 125 mg/dL — ABNORMAL HIGH (ref 70–99)
Phosphorus: 4.5 mg/dL (ref 2.5–4.6)
Potassium: 4 mmol/L (ref 3.5–5.1)
Sodium: 135 mmol/L (ref 135–145)

## 2020-11-11 LAB — TYPE AND SCREEN
ABO/RH(D): O POS
Antibody Screen: NEGATIVE
Unit division: 0

## 2020-11-11 LAB — BPAM RBC
Blood Product Expiration Date: 202207082359
ISSUE DATE / TIME: 202206110523
Unit Type and Rh: 5100

## 2020-11-11 NOTE — Progress Notes (Signed)
PROGRESS NOTE    Daniel Mccarty  EHU:[id number] DOB: 04/29/1966 DOA: 11/01/2020 PCP: Patient, No Pcp Per (Inactive)    Brief Narrative:  Daniel Mccarty is a 55 year old male with past medical history significant for essential hypertension, CKD stage IV, urinary retention, BPH who presented to [facility] ED on 6/2 at the recommendation of his PCP due to hypertensive urgency with SBP in the 200s.  Patient has not seen a provider for more than 2 years and has been off his antihypertensives for the last 2 years since he has had no refills.  Patient reports progressive fatigue.  In the ED, patient was noted to have a BUN of 239 with a creatinine of 33, serum bicarbonate less than 7, hemoglobin 5.8, platelets 207.  Bicarb drip.  Nephrology was consulted.  TRH consulted for further evaluation and management of hypertensive urgency, and progression of renal failure.   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  Found to have elevated BUN of 239 with a creatinine of 33.  SBP in the 200s on ED arrival.  Patient has been off antihypertensive treatment for the last 2 years due to noncompliance with outpatient visits.  Renal ultrasound with bilateral hydronephrosis with distended urinary bladder with associated renal cortical thinning and increased parenchymal echogenicity suggesting chronic cause.  Patient has left radiocephalic fistula placed by vascular surgery, Dr. Scot Dock on 11/09/2020. --Nephrology/vascular surgery following, appreciate assistance --Cr 33>>>12.96>12.95>7.98>9.06>10.20 --Continue HD per nephrology, on TTS schedule now --Outpatient  follow-up with vascular surgery for fistula duplex 4-6 weeks --pending outpatient HD placement  Hypertensive urgency: Resolved Patient presenting with SBP in the 200s.  Has been off of antihypertensives for 2 years due to noncompliance with medical therapy. BP 118/68 this morning --Amlodipine 5 mg p.o. daily  Obstructive uropathy BPH Ultrasound on 6/2 with marked bilateral hydronephrosis with renal cortical thinning and increased parenchymal echogenicity suggesting chronic, distended urinary bladder and mild bladder wall thickening.  Etiology likely secondary to chronic BPH causing obstruction and now superimposed medical renal disease.  Foley catheter initially placed which was discontinued on 11/05/2020; now replaced 6/8 due to recurrent high PVRs. --Tamsulosin 0.4 mg p.o. daily --Finasteride 5 mg p.o. daily --continue foley catheter, will need follow-up outpatient  Anemia of chronic renal disease Hemoglobin 5.8 on admission.  Transfuse 2 unit PRBC on 6/2, 1 unit 6/6, and 1 unit 6/11.  Iron 123, TIBC 206, ferritin 374, folate 7.1, vitamin B12 1063. --Hgb 5.8>>>7.7>8.0>7.1>6.6>8.5>7.4 --Transfuse 1 unit PRBC today --Transfuse for hemoglobin less than 7.0  Hypocalcemia Received 2 g calcium gluconate on 6/2 and 1 g calcium gluconate on 6/6. --Calcium 6.6 with albumin 3.1, corrected for hypoalbuminemia calcium 7.3.  Moderate protein calorie malnutrition: Body mass index is 24.34 kg/m. Nutrition Status: Nutrition Problem: Moderate Malnutrition Etiology: chronic illness (CKD) Signs/Symptoms: mild muscle depletion, mild fat depletion Interventions: Boost Breeze  --Dietitian following, appreciate assistance --Continue to encourage increased oral intake, supplements     DVT prophylaxis: heparin injection 5,000 Units Start: 11/01/20 2200 SCDs Start: 11/01/20 1941   Code Status: Full Code Family Communication: None present at bedside this morning  Disposition Plan:  Level of care:  Med-Surg Status is: Inpatient  Remains inpatient appropriate because:Ongoing diagnostic testing needed not appropriate for outpatient work up, Unsafe d/c plan, IV treatments appropriate due to intensity of illness or inability to take PO and Inpatient level of care appropriate due to severity of illness   Dispo:  Patient From: Home  Planned Disposition: Home  Medically stable for discharge: No       Consultants:  Nephrology Vascular surgery Interventional radiology  Procedures:  HD Catheter placement, IR 6/3 Foley catheter placement 6/8  Antimicrobials:  None   Subjective: Patient seen and examined at bedside, resting comfortably.  No complaints.  Tolerated HD well yesterday.  Awaiting for outpatient HD spot.  Denies headache, no dizziness, no fever/chills/night sweats, no nausea/vomiting/diarrhea, no chest pain, no palpitations, no shortness of breath, no abdominal pain, no weakness, no fatigue, no paresthesias.  No other acute events overnight per nursing staff.  Objective: Vitals:   11/10/20 1803 11/10/20 2000 11/11/20 0529 11/11/20 0950  BP: 121/75 112/71 118/68 117/75  Pulse: 80 84 85 81  Resp: 16 18 18 13   Temp: 98.7 F (37.1 C) 99 F (37.2 C) 99 F (37.2 C) 98.3 F (36.8 C)  TempSrc: Oral Oral Oral Oral  SpO2: 100% 99% 99% 95%  Weight:   83.7 kg   Height:   6\' 1"  (1.854 m)     Intake/Output Summary (Last 24 hours) at 11/11/2020 1134 Last data filed at 11/10/2020 1730 Gross per 24 hour  Intake --  Output 0 ml  Net 0 ml   Filed Weights   11/10/20 1353 11/10/20 1747 11/11/20 0529  Weight: 84.7 kg 84.7 kg 83.7 kg    Examination:  General exam: Appears calm and comfortable  Respiratory system: Clear to auscultation. Respiratory effort normal.  On room air Cardiovascular system: S1 & S2 heard, RRR. No JVD, murmurs, rubs, gallops or clicks. No pedal edema.  Tunneled HD catheter noted right chest. Gastrointestinal system: Abdomen is nondistended, soft and  nontender. No organomegaly or masses felt. Normal bowel sounds heard. GU: Foley noted in place, clear yellow urine draining. Central nervous system: Alert and oriented. No focal neurological deficits. Extremities: Symmetric 5 x 5 power.  Left radiocephalic fistula noted, surgical incisionsclean/dry/intact Skin: No rashes, lesions or ulcers Psychiatry: Judgement and insight appear normal. Mood & affect appropriate.     Data Reviewed: I have personally reviewed following labs and imaging studies  CBC: Recent Labs  Lab 11/06/20 0540 11/07/20 0317 11/09/20 0423 11/10/20 0244 11/10/20 0946 11/11/20 0135  WBC 9.3 10.9* 6.7 6.9 8.2 5.7  NEUTROABS 7.6  --   --   --   --   --   HGB 7.7* 8.0* 7.1* 6.6* 8.5* 7.4*  HCT 23.2* 24.0* 22.3* 20.5* 26.7* 23.3*  MCV 85.0 84.5 87.8 87.6 89.9 90.0  PLT 161 195 203 219 270 751   Basic Metabolic Panel: Recent Labs  Lab 11/07/20 0317 11/08/20 0350 11/09/20 0423 11/10/20 0244 11/11/20 0135  NA 133* 135 136 135 135  K 4.0 3.9 4.3 4.4 4.0  CL 99 99 101 102 100  CO2 19* 25 22 20* 28  GLUCOSE 108* 102* 100* 165* 125*  BUN 95* 51* 62* 70* 32*  CREATININE 12.95* 7.98* 9.06* 10.20* 6.31*  CALCIUM 6.6* 7.4* 7.5* 7.9* 8.2*  MG 1.5*  --   --   --   --   PHOS 4.4 3.9 5.0* 3.8 4.5   GFR: Estimated Creatinine Clearance: 15.1 mL/min (A) (by C-G formula based on SCr of  6.31 mg/dL (H)). Liver Function Tests: Recent Labs  Lab 11/07/20 0317 11/08/20 0350 11/09/20 0423 11/10/20 0244 11/11/20 0135  ALBUMIN 3.1* 2.7* 2.7* 2.5* 2.6*   No results for input(s): LIPASE, AMYLASE in the last 168 hours. No results for input(s): AMMONIA in the last 168 hours. Coagulation Profile: No results for input(s): INR, PROTIME in the last 168 hours. Cardiac Enzymes: No results for input(s): CKTOTAL, CKMB, CKMBINDEX, TROPONINI in the last 168 hours. BNP (last 3 results) No results for input(s): PROBNP in the last 8760 hours. HbA1C: No results for input(s): HGBA1C  in the last 72 hours. CBG: No results for input(s): GLUCAP in the last 168 hours. Lipid Profile: No results for input(s): CHOL, HDL, LDLCALC, TRIG, CHOLHDL, LDLDIRECT in the last 72 hours. Thyroid Function Tests: No results for input(s): TSH, T4TOTAL, FREET4, T3FREE, THYROIDAB in the last 72 hours. Anemia Panel: No results for input(s): VITAMINB12, FOLATE, FERRITIN, TIBC, IRON, RETICCTPCT in the last 72 hours. Sepsis Labs: No results for input(s): PROCALCITON, LATICACIDVEN in the last 168 hours.  Recent Results (from the past 240 hour(s))  Culture, Urine     Status: None   Collection Time: 11/01/20  8:05 PM   Specimen: Urine, Random  Result Value Ref Range Status   Specimen Description URINE, RANDOM  Final   Special Requests NONE  Final   Culture   Final    NO GROWTH Performed at [facility], [address] [place], [place] [address]    Report Status 11/03/2020 FINAL  Final  Resp Panel by RT-PCR (Flu A&B, Covid) Nasopharyngeal Swab     Status: None   Collection Time: 11/01/20  8:24 PM   Specimen: Nasopharyngeal Swab; Nasopharyngeal(NP) swabs in vial transport medium  Result Value Ref Range Status   SARS Coronavirus 2 by RT PCR NEGATIVE NEGATIVE Final    Comment: (NOTE) SARS-CoV-2 target nucleic acids are NOT DETECTED.  The SARS-CoV-2 RNA is generally detectable in upper respiratory specimens during the acute phase of infection. The lowest concentration of SARS-CoV-2 viral copies this assay can detect is 138 copies/mL. A negative result does not preclude SARS-Cov-2 infection and should not be used as the sole basis for treatment or other patient management decisions. A negative result may occur with  improper specimen collection/handling, submission of specimen other than nasopharyngeal swab, presence of viral mutation(s) within the areas targeted by this assay, and inadequate number of viral copies(<138 copies/mL). A negative result must be combined with clinical  observations, patient history, and epidemiological information. The expected result is Negative.  Fact Sheet for Patients:  EntrepreneurPulse.com.au  Fact Sheet for Healthcare Providers:  IncredibleEmployment.be  This test is no t yet approved or cleared by the [place] FDA and  has been authorized for detection and/or diagnosis of SARS-CoV-2 by FDA under an Emergency Use Authorization (EUA). This EUA will remain  in effect (meaning this test can be used) for the duration of the COVID-19 declaration under Section 564(b)(1) of the Act, 21 U.S.C.section 360bbb-3(b)(1), unless the authorization is terminated  or revoked sooner.       Influenza A by PCR NEGATIVE NEGATIVE Final   Influenza B by PCR NEGATIVE NEGATIVE Final    Comment: (NOTE) The Xpert Xpress SARS-CoV-2/FLU/RSV plus assay is intended as an aid in the diagnosis of influenza from Nasopharyngeal swab specimens and should not be used as a sole basis for treatment. Nasal washings and aspirates are unacceptable for Xpert Xpress SARS-CoV-2/FLU/RSV testing.  Fact Sheet for Patients: EntrepreneurPulse.com.au  Fact Sheet for Healthcare Providers: IncredibleEmployment.be  This test is not yet approved or cleared by the [place] FDA and has been authorized for detection and/or diagnosis of SARS-CoV-2 by FDA under an Emergency Use Authorization (EUA). This EUA will remain in effect (meaning this test can be used) for the duration of the COVID-19 declaration under Section 564(b)(1) of the Act, 21 U.S.C. section 360bbb-3(b)(1), unless the authorization is terminated or revoked.  Performed at [facility], [place] [address], [place], [place] [address]          Radiology Studies: No results found.       Scheduled Meds:  sodium chloride   Intravenous Once   amLODipine  5 mg Oral Daily   calcitRIOL  0.25 mcg Oral Daily   calcium  acetate (Phos Binder)  1,334 mg Oral TID WC   Chlorhexidine Gluconate Cloth  6 each Topical Daily   COVID-19 mRNA vaccine (Moderna)  0.5 mL Intramuscular Once   darbepoetin (ARANESP) injection - NON-DIALYSIS  60 mcg Subcutaneous Q Tue-1800   feeding supplement  1 Container Oral TID BM   finasteride  5 mg Oral Daily   heparin  5,000 Units Subcutaneous Q8H   ondansetron  4 mg Oral Once   senna-docusate  1 tablet Oral QHS   tamsulosin  0.4 mg Oral QPC supper   Continuous Infusions:     LOS: 10 days    Time spent: 38 minutes spent on chart review, discussion with nursing staff, consultants, updating family and interview/physical exam; more than 50% of that time was spent in counseling and/or coordination of care.    Daniel Mccarty J [place] DO [facility] Available via Epic secure chat 7am-7pm After these hours, please refer to coverage provider listed on [email or web address] 11/11/2020, 11:34 AM

## 2020-11-11 NOTE — Progress Notes (Signed)
Patient ID: Syair Fricker, male   DOB: 06-16-1965, 55 y.o.   MRN: [id number] S: Feels well, no complaints. O:BP 117/75 (BP Location: Right Arm)   Pulse 81   Temp 98.3 F (36.8 C) (Oral)   Resp 13   Ht 6\' 1"  (1.854 m)   Wt 83.7 kg   SpO2 95%   BMI 24.34 kg/m   Intake/Output Summary (Last 24 hours) at 11/11/2020 1058 Last data filed at 11/10/2020 1730 Gross per 24 hour  Intake --  Output 0 ml  Net 0 ml   Intake/Output: I/O last 3 completed shifts: In: 245.3 [Blood:245.3] Out: 850 [Urine:850]  Intake/Output this shift:  No intake/output data recorded. Weight change: -0.395 kg Gen:NAD CVS:RRR Resp: cta Abd: benign Ext: no edema, left avf +T/B  Recent Labs  Lab 11/05/20 0720 11/06/20 0540 11/07/20 0317 11/08/20 0350 11/09/20 0423 11/10/20 0244 11/11/20 0135  NA 134* 133* 133* 135 136 135 135  K 3.4* 3.4* 4.0 3.9 4.3 4.4 4.0  CL 99 97* 99 99 101 102 100  CO2 22 21* 19* 25 22 20* 28  GLUCOSE 129* 123* 108* 102* 100* 165* 125*  BUN 85* 94* 95* 51* 62* 70* 32*  CREATININE 13.11* 12.96* 12.95* 7.98* 9.06* 10.20* 6.31*  ALBUMIN  --   --  3.1* 2.7* 2.7* 2.5* 2.6*  CALCIUM 6.3* 6.4* 6.6* 7.4* 7.5* 7.9* 8.2*  PHOS  --   --  4.4 3.9 5.0* 3.8 4.5   Liver Function Tests: Recent Labs  Lab 11/09/20 0423 11/10/20 0244 11/11/20 0135  ALBUMIN 2.7* 2.5* 2.6*   No results for input(s): LIPASE, AMYLASE in the last 168 hours. No results for input(s): AMMONIA in the last 168 hours. CBC: Recent Labs  Lab 11/06/20 0540 11/07/20 0317 11/09/20 0423 11/10/20 0244 11/10/20 0946 11/11/20 0135  WBC 9.3 10.9* 6.7 6.9 8.2 5.7  NEUTROABS 7.6  --   --   --   --   --   HGB 7.7* 8.0* 7.1* 6.6* 8.5* 7.4*  HCT 23.2* 24.0* 22.3* 20.5* 26.7* 23.3*  MCV 85.0 84.5 87.8 87.6 89.9 90.0  PLT 161 195 203 219 270 229   Cardiac Enzymes: No results for input(s): CKTOTAL, CKMB, CKMBINDEX, TROPONINI in the last 168 hours. CBG: No results for input(s): GLUCAP in the last 168 hours.  Iron  Studies: No results for input(s): IRON, TIBC, TRANSFERRIN, FERRITIN in the last 72 hours. Studies/Results: No results found.  sodium chloride   Intravenous Once   amLODipine  5 mg Oral Daily   calcitRIOL  0.25 mcg Oral Daily   calcium acetate (Phos Binder)  1,334 mg Oral TID WC   Chlorhexidine Gluconate Cloth  6 each Topical Daily   COVID-19 mRNA vaccine (Moderna)  0.5 mL Intramuscular Once   darbepoetin (ARANESP) injection - NON-DIALYSIS  60 mcg Subcutaneous Q Tue-1800   feeding supplement  1 Container Oral TID BM   finasteride  5 mg Oral Daily   heparin  5,000 Units Subcutaneous Q8H   ondansetron  4 mg Oral Once   senna-docusate  1 tablet Oral QHS   tamsulosin  0.4 mg Oral QPC supper    BMET    Component Value Date/Time   NA 135 11/11/2020 0135   K 4.0 11/11/2020 0135   CL 100 11/11/2020 0135   CO2 28 11/11/2020 0135   GLUCOSE 125 (H) 11/11/2020 0135   BUN 32 (H) 11/11/2020 0135   CREATININE 6.31 (H) 11/11/2020 0135   CALCIUM 8.2 (L) 11/11/2020 0135  GFRNONAA 10 (L) 11/11/2020 0135   GFRAA 25 (L) 08/15/2018 0728   CBC    Component Value Date/Time   WBC 5.7 11/11/2020 0135   RBC 2.59 (L) 11/11/2020 0135   HGB 7.4 (L) 11/11/2020 0135   HCT 23.3 (L) 11/11/2020 0135   PLT 229 11/11/2020 0135   MCV 90.0 11/11/2020 0135   MCH 28.6 11/11/2020 0135   MCHC 31.8 11/11/2020 0135   RDW 13.1 11/11/2020 0135   LYMPHSABS 0.4 (L) 11/06/2020 0540   MONOABS 1.0 11/06/2020 0540   EOSABS 0.1 11/06/2020 0540   BASOSABS 0.0 11/06/2020 0540      Assessment/Plan:   New ESRD - multifactorial with chronic HTN as well as BPH and urinary retention/obstruction.  Renal US with marked bilateral hydronephrosis.  Significant improvement with Foley catheter, but still with significantly elevated BUN/Cr and s/p HD x 2.  Last HD session 11/03/20.   Scr stable since last HD but BUN slowly climbing.   He is without uremic symptoms but has had rising BUN/Cr between HD sessions. VVS consulted and  had left radiocephalic AVF creation 9/75/30 by Dr. Scot Dock.  Good thrill and bruit. Good UOP with foley. Awaiting outpatient HD spot as he has progressed to ESRD.   Continue with TTS schedule for now.  Urinary obstruction - bilateral hydronephrosis, present since 2020 but progressed since then.  Likely chronic.  Foley catheter removed today and will need to follow PVR.  He may benefit from urology evaluation if remains an issue.  Currently on finasteride and tamsulosin. foley catheter replaced due to elevated PVR's. Consider urology evaluation for recommendations. 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  Receiving IV calcium and started on calcium acetate binders qac.  PTH elevated at 406 and will start calcitriol. Anemia of CKD stage IV-V:  S/p blood transfusion and now on ESA. Vascular access:  Had RIJ [place] placed 11/02/20 and left radiocephalic avf creation 0/51/10 by Dr. Scot Dock. Hypertensive urgency:  Improved BP with meds, had not taken any for the past 2 years.  Donetta Potts, MD [employer] [phone]

## 2020-11-12 LAB — RENAL FUNCTION PANEL
Albumin: 2.5 g/dL — ABNORMAL LOW (ref 3.5–5.0)
Anion gap: 12 (ref 5–15)
BUN: 52 mg/dL — ABNORMAL HIGH (ref 6–20)
CO2: 23 mmol/L (ref 22–32)
Calcium: 8.2 mg/dL — ABNORMAL LOW (ref 8.9–10.3)
Chloride: 100 mmol/L (ref 98–111)
Creatinine, Ser: 8.3 mg/dL — ABNORMAL HIGH (ref 0.61–1.24)
GFR, Estimated: 7 mL/min — ABNORMAL LOW (ref >60)
Glucose, Bld: 100 mg/dL — ABNORMAL HIGH (ref 70–99)
Phosphorus: 5.8 mg/dL — ABNORMAL HIGH (ref 2.5–4.6)
Potassium: 4.3 mmol/L (ref 3.5–5.1)
Sodium: 135 mmol/L (ref 135–145)

## 2020-11-12 LAB — CBC
HCT: 24.1 % — ABNORMAL LOW (ref 39.0–52.0)
Hemoglobin: 7.4 g/dL — ABNORMAL LOW (ref 13.0–17.0)
MCH: 28.4 pg (ref 26.0–34.0)
MCHC: 30.7 g/dL (ref 30.0–36.0)
MCV: 92.3 fL (ref 80.0–100.0)
Platelets: 235 10*3/uL (ref 150–400)
RBC: 2.61 MIL/uL — ABNORMAL LOW (ref 4.22–5.81)
RDW: 13 % (ref 11.5–15.5)
WBC: 4.9 10*3/uL (ref 4.0–10.5)
nRBC: 0 % (ref 0.0–0.2)

## 2020-11-12 NOTE — Progress Notes (Signed)
[facility] Progress Note   Assessment/ Plan:   New ESRD - multifactorial with chronic HTN as well as BPH and urinary retention/obstruction.  Renal US with marked bilateral hydronephrosis.  Significant improvement with Foley catheter, but still with significantly elevated BUN/Cr.  VVS consulted and had left radiocephalic AVF creation 12/01/75 by Dr. Scot Dock.  Good thrill and bruit. Good UOP with foley. Awaiting outpatient HD spot as he has progressed to ESRD.   Continue with TTS schedule for now. Discussed transplant and home therapies today- he has a full time job and is very functional- would be a good candidate for both.  Urinary obstruction - bilateral hydronephrosis, present since 2020 but progressed since then.  Likely chronic.  Foley catheter removed today and will need to follow PVR.  He may benefit from urology evaluation if remains an issue.  Currently on finasteride and tamsulosin. 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  Receiving IV calcium and started on calcium acetate binders qac.  PTH elevated at 406 and will start calcitriol. Anemia of CKD stage IV-V:  S/p blood transfusion and now on ESA. Vascular access:  Had RIJ [place] placed 11/02/20 and left radiocephalic avf creation 9/39/03 by Dr. Scot Dock. Hypertensive urgency:  Improved BP with meds, had not taken any for the past 2 years.  Subjective:    Seen in room.  For HD tomorrow.     Objective:   BP 107/67 (BP Location: Left Arm)   Pulse 81   Temp 98.2 F (36.8 C) (Oral)   Resp 15   Ht 6\' 1"  (1.854 m)   Wt 84.1 kg   SpO2 98%   BMI 24.46 kg/m   Physical Exam: Gen: NAD, sleeping CVS: RRR Resp: clear Abd: soft Ext: trace LE edema ACCESS: R IJ TDC, L forearm AVF + T/B  Labs: BMET Recent Labs  Lab 11/06/20 0540 11/07/20 0317 11/08/20 0350 11/09/20 0423 11/10/20 0244 11/11/20 0135 11/12/20 0426  NA 133* 133* 135 136 135 135 135  K 3.4* 4.0 3.9 4.3 4.4 4.0 4.3  CL 97* 99 99 101 102 100  100  CO2 21* 19* 25 22 20* 28 23  GLUCOSE 123* 108* 102* 100* 165* 125* 100*  BUN 94* 95* 51* 62* 70* 32* 52*  CREATININE 12.96* 12.95* 7.98* 9.06* 10.20* 6.31* 8.30*  CALCIUM 6.4* 6.6* 7.4* 7.5* 7.9* 8.2* 8.2*  PHOS  --  4.4 3.9 5.0* 3.8 4.5 5.8*   CBC Recent Labs  Lab 11/06/20 0540 11/07/20 0317 11/10/20 0244 11/10/20 0946 11/11/20 0135 11/12/20 0426  WBC 9.3   < > 6.9 8.2 5.7 4.9  NEUTROABS 7.6  --   --   --   --   --   HGB 7.7*   < > 6.6* 8.5* 7.4* 7.4*  HCT 23.2*   < > 20.5* 26.7* 23.3* 24.1*  MCV 85.0   < > 87.6 89.9 90.0 92.3  PLT 161   < > 219 270 229 235   < > = values in this interval not displayed.      Medications:     sodium chloride   Intravenous Once   amLODipine  5 mg Oral Daily   calcitRIOL  0.25 mcg Oral Daily   calcium acetate (Phos Binder)  1,334 mg Oral TID WC   Chlorhexidine Gluconate Cloth  6 each Topical Daily   COVID-19 mRNA vaccine (Moderna)  0.5 mL Intramuscular Once   darbepoetin (ARANESP) injection - NON-DIALYSIS  60 mcg Subcutaneous Q Tue-1800   feeding  supplement  1 Container Oral TID BM   finasteride  5 mg Oral Daily   heparin  5,000 Units Subcutaneous Q8H   ondansetron  4 mg Oral Once   senna-docusate  1 tablet Oral QHS   tamsulosin  0.4 mg Oral QPC supper     Madelon Lips MD 11/12/2020, 12:47 PM

## 2020-11-12 NOTE — TOC Progression Note (Signed)
Transition of Care [facility]) - Progression Note    Patient Details  Name: Daniel Mccarty MRN: [id number] Date of Birth: 21-Jul-1965  Transition of Care [facility]) CM/SW [place] [place] Phone Number: 11/12/2020, 4:48 PM  Clinical Narrative:     CSW spoke with referral coordinator Terri Piedra who confirmed patients HD referral is in process. CSW will follow up with [facility] tomorrow on referral status.CSW will continue to follow and assist with discharge planning needs.       Expected Discharge Plan and Services                                                 Social Determinants of Health (SDOH) Interventions    Readmission Risk Interventions No flowsheet data found.

## 2020-11-12 NOTE — Progress Notes (Signed)
PROGRESS NOTE    Daniel Mccarty  QZR:[id number] DOB: Sep 26, 1965 DOA: 11/01/2020 PCP: Patient, No Pcp Per (Inactive)    Brief Narrative:  Daniel Mccarty is a 54 year old male with past medical history significant for essential hypertension, CKD stage IV, urinary retention, BPH who presented to [facility] ED on 6/2 at the recommendation of his PCP due to hypertensive urgency with SBP in the 200s.  Patient has not seen a provider for more than 2 years and has been off his antihypertensives for the last 2 years since he has had no refills.  Patient reports progressive fatigue.  In the ED, patient was noted to have a BUN of 239 with a creatinine of 33, serum bicarbonate less than 7, hemoglobin 5.8, platelets 207.  Bicarb drip.  Nephrology was consulted.  TRH consulted for further evaluation and management of hypertensive urgency, and progression of renal failure.   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  Found to have elevated BUN of 239 with a creatinine of 33.  SBP in the 200s on ED arrival.  Patient has been off antihypertensive treatment for the last 2 years due to noncompliance with outpatient visits.  Renal ultrasound with bilateral hydronephrosis with distended urinary bladder with associated renal cortical thinning and increased parenchymal echogenicity suggesting chronic cause.  Patient has left radiocephalic fistula placed by vascular surgery, Dr. Scot Dock on 11/09/2020. --Nephrology/vascular surgery following, appreciate assistance --Continue HD per nephrology, on TTS schedule now --Outpatient follow-up with vascular surgery for fistula  duplex 4-6 weeks --pending outpatient HD placement  Hypertensive urgency: Resolved Patient presenting with SBP in the 200s.  Has been off of antihypertensives for 2 years due to noncompliance with medical therapy. BP 121/76 this morning --Amlodipine 5 mg p.o. daily  Obstructive uropathy BPH Ultrasound on 6/2 with marked bilateral hydronephrosis with renal cortical thinning and increased parenchymal echogenicity suggesting chronic, distended urinary bladder and mild bladder wall thickening.  Etiology likely secondary to chronic BPH causing obstruction and now superimposed medical renal disease.  Foley catheter initially placed which was discontinued on 11/05/2020; now replaced 6/8 due to recurrent high PVRs. --Tamsulosin 0.4 mg p.o. daily --Finasteride 5 mg p.o. daily --continue foley catheter, will need follow-up outpatient  Anemia of chronic renal disease Hemoglobin 5.8 on admission.  Transfuse 2 unit PRBC on 6/2, 1 unit 6/6, and 1 unit 6/11.  Iron 123, TIBC 206, ferritin 374, folate 7.1, vitamin B12 1063. --Hgb 5.8>>>7.7>8.0>7.1>6.6>8.5>7.4>7.4 --Transfuse for hemoglobin less than 7.0  Hypocalcemia Received 2 g calcium gluconate on 6/2 and 1 g calcium gluconate on 6/6. --Calcium 8.2 today  Moderate protein calorie malnutrition: Body mass index is 24.46 kg/m. Nutrition Status: Nutrition Problem: Moderate Malnutrition Etiology: chronic illness (CKD) Signs/Symptoms: mild muscle depletion, mild fat depletion Interventions: Boost Breeze  --Dietitian following, appreciate assistance --Continue to encourage increased oral intake, supplements     DVT prophylaxis: heparin injection 5,000 Units Start: 11/01/20 2200 SCDs Start: 11/01/20 1941   Code Status: Full Code Family Communication: None present at bedside this morning  Disposition Plan:  Level of care: Med-Surg Status is: Inpatient  Remains inpatient appropriate because:Ongoing diagnostic testing needed not appropriate  for  outpatient work up, Unsafe d/c plan, IV treatments appropriate due to intensity of illness or inability to take PO and Inpatient level of care appropriate due to severity of illness   Dispo:  Patient From: Home  Planned Disposition: Home  Medically stable for discharge: No       Consultants:  Nephrology Vascular surgery Interventional radiology  Procedures:  HD Catheter placement, IR 6/3 Foley catheter placement 6/8 Radiocephalic fistula placement 6/10, Dr. Scot Dock  Antimicrobials:  None   Subjective: Patient seen and examined at bedside, resting comfortably.  No complaints.    Awaiting for outpatient HD spot.  Denies headache, no dizziness, no fever/chills/night sweats, no nausea/vomiting/diarrhea, no chest pain, no palpitations, no shortness of breath, no abdominal pain, no weakness, no fatigue, no paresthesias.  No other acute events overnight per nursing staff.  Objective: Vitals:   11/11/20 2100 11/12/20 0500 11/12/20 0530 11/12/20 0945  BP: 114/62  121/76 107/67  Pulse: 86  78 81  Resp: 18  17 15   Temp: 98.8 F (37.1 C)  98.3 F (36.8 C) 98.2 F (36.8 C)  TempSrc: Oral  Oral Oral  SpO2: 97%  97% 98%  Weight:  84.1 kg    Height:        Intake/Output Summary (Last 24 hours) at 11/12/2020 1057 Last data filed at 11/12/2020 0900 Gross per 24 hour  Intake 240 ml  Output 2300 ml  Net -2060 ml   Filed Weights   11/10/20 1747 11/11/20 0529 11/12/20 0500  Weight: 84.7 kg 83.7 kg 84.1 kg    Examination:  General exam: Appears calm and comfortable  Respiratory system: Clear to auscultation. Respiratory effort normal.  On room air Cardiovascular system: S1 & S2 heard, RRR. No JVD, murmurs, rubs, gallops or clicks. No pedal edema.  Tunneled HD catheter noted right chest. Gastrointestinal system: Abdomen is nondistended, soft and nontender. No organomegaly or masses felt. Normal bowel sounds heard. GU: Foley noted in place, clear yellow urine draining. Central  nervous system: Alert and oriented. No focal neurological deficits. Extremities: Symmetric 5 x 5 power.  Left radiocephalic fistula noted, surgical incisionsclean/dry/intact Skin: No rashes, lesions or ulcers Psychiatry: Judgement and insight appear normal. Mood & affect appropriate.     Data Reviewed: I have personally reviewed following labs and imaging studies  CBC: Recent Labs  Lab 11/06/20 0540 11/07/20 0317 11/09/20 0423 11/10/20 0244 11/10/20 0946 11/11/20 0135 11/12/20 0426  WBC 9.3   < > 6.7 6.9 8.2 5.7 4.9  NEUTROABS 7.6  --   --   --   --   --   --   HGB 7.7*   < > 7.1* 6.6* 8.5* 7.4* 7.4*  HCT 23.2*   < > 22.3* 20.5* 26.7* 23.3* 24.1*  MCV 85.0   < > 87.8 87.6 89.9 90.0 92.3  PLT 161   < > 203 219 270 229 235   < > = values in this interval not displayed.   Basic Metabolic Panel: Recent Labs  Lab 11/07/20 0317 11/08/20 0350 11/09/20 0423 11/10/20 0244 11/11/20 0135 11/12/20 0426  NA 133* 135 136 135 135 135  K 4.0 3.9 4.3 4.4 4.0 4.3  CL 99 99 101 102 100 100  CO2 19* 25 22 20* 28 23  GLUCOSE 108* 102* 100* 165* 125* 100*  BUN 95* 51* 62* 70* 32* 52*  CREATININE 12.95* 7.98* 9.06* 10.20* 6.31* 8.30*  CALCIUM 6.6* 7.4* 7.5* 7.9* 8.2* 8.2*  MG 1.5*  --   --   --   --   --  PHOS 4.4 3.9 5.0* 3.8 4.5 5.8*   GFR: Estimated Creatinine Clearance: 11.5 mL/min (A) (by C-G formula based on SCr of 8.3 mg/dL (H)). Liver Function Tests: Recent Labs  Lab 11/08/20 0350 11/09/20 0423 11/10/20 0244 11/11/20 0135 11/12/20 0426  ALBUMIN 2.7* 2.7* 2.5* 2.6* 2.5*   No results for input(s): LIPASE, AMYLASE in the last 168 hours. No results for input(s): AMMONIA in the last 168 hours. Coagulation Profile: No results for input(s): INR, PROTIME in the last 168 hours. Cardiac Enzymes: No results for input(s): CKTOTAL, CKMB, CKMBINDEX, TROPONINI in the last 168 hours. BNP (last 3 results) No results for input(s): PROBNP in the last 8760 hours. HbA1C: No results  for input(s): HGBA1C in the last 72 hours. CBG: No results for input(s): GLUCAP in the last 168 hours. Lipid Profile: No results for input(s): CHOL, HDL, LDLCALC, TRIG, CHOLHDL, LDLDIRECT in the last 72 hours. Thyroid Function Tests: No results for input(s): TSH, T4TOTAL, FREET4, T3FREE, THYROIDAB in the last 72 hours. Anemia Panel: No results for input(s): VITAMINB12, FOLATE, FERRITIN, TIBC, IRON, RETICCTPCT in the last 72 hours. Sepsis Labs: No results for input(s): PROCALCITON, LATICACIDVEN in the last 168 hours.  No results found for this or any previous visit (from the past 240 hour(s)).        Radiology Studies: No results found.       Scheduled Meds:  sodium chloride   Intravenous Once   amLODipine  5 mg Oral Daily   calcitRIOL  0.25 mcg Oral Daily   calcium acetate (Phos Binder)  1,334 mg Oral TID WC   Chlorhexidine Gluconate Cloth  6 each Topical Daily   COVID-19 mRNA vaccine (Moderna)  0.5 mL Intramuscular Once   darbepoetin (ARANESP) injection - NON-DIALYSIS  60 mcg Subcutaneous Q Tue-1800   feeding supplement  1 Container Oral TID BM   finasteride  5 mg Oral Daily   heparin  5,000 Units Subcutaneous Q8H   ondansetron  4 mg Oral Once   senna-docusate  1 tablet Oral QHS   tamsulosin  0.4 mg Oral QPC supper   Continuous Infusions:     LOS: 11 days    Time spent: 34 minutes spent on chart review, discussion with nursing staff, consultants, updating family and interview/physical exam; more than 50% of that time was spent in counseling and/or coordination of care.    Kushi Kun J [place] DO [facility] Available via Epic secure chat 7am-7pm After these hours, please refer to coverage provider listed on [email or web address] 11/12/2020, 10:57 AM

## 2020-11-12 NOTE — Progress Notes (Signed)
Occupational Therapy Treatment Patient Details Name: Daniel Mccarty MRN: [id number] DOB: 28-May-1966 Today's Date: 11/12/2020    History of present illness Patient is a 55 y.o. male admitted from PCP on 11/01/20 due to elevated BP; workup for hypertensive urgency, AKI on CKD IV, metabolic acidosis, severe anemia. S/p tunneled HD cath placement 11/02/20 and initation of HD. PMH includes HTN, BPH, urinary retention.   OT comments  Pt making steady progress towards OT goals this session. Pt appears to be nearing baseline level of function with pt completing functional mobility greater than a household distance with no AD and supervision to min guard assist. Pt also standing at sink to complete ADLs with supervision assist ~ 5 mins. Of note pt HR did increase to 132 bpm during grooming tasks. Pt able to step over obstacles in hallway with no LOB, pt AxOx4. Pt overall pleasant and cooperative with pt eager to return home. Will continue to follow acutely per POC.    Follow Up Recommendations  No OT follow up    Equipment Recommendations  None recommended by OT    Recommendations for Other Services      Precautions / Restrictions Precautions Precautions: Other (comment) Precaution Comments: Watch HR Restrictions Weight Bearing Restrictions: No       Mobility Bed Mobility Overal bed mobility: Modified Independent Bed Mobility: Supine to Sit     Supine to sit: Modified independent (Device/Increase time)     General bed mobility comments: no physical assist needed, elevated HOB    Transfers Overall transfer level: Needs assistance Equipment used: None Transfers: Sit to/from Stand Sit to Stand: Supervision         General transfer comment: supervision for safety from EOB    Balance Overall balance assessment: Needs assistance Sitting-balance support: Feet supported;No upper extremity supported Sitting balance-Leahy Scale: Good     Standing balance support: During functional  activity Standing balance-Leahy Scale: Good Standing balance comment: ambulating with no UE support, able to step over obstacles in hallway and stand at sink with no UE support or LOB                           ADL either performed or assessed with clinical judgement   ADL Overall ADL's : Needs assistance/impaired     Grooming: Wash/dry hands;Wash/dry face;Oral care;Standing;Supervision/safety                   Toilet Transfer: Supervision/safety;Ambulation;Min guard [employer] forces technical officer Details (indicate cue type and reason): simulated via functional mobility         Functional mobility during ADLs: Supervision/safety;Min guard General ADL Comments: pt completing functional mobility and standing ADLs, cog appears to have improved     Vision       Perception     Praxis      Cognition Arousal/Alertness: Awake/alert Behavior During Therapy: WFL for tasks assessed/performed Overall Cognitive Status: Within Functional Limits for tasks assessed                                 General Comments: overall WFL for simple mobility tasks, did not formally assess        Exercises     Shoulder Instructions       General Comments HR increase to as much as 132 bpm, BP from supine 109/72    Pertinent Vitals/ Pain       Pain Assessment:  No/denies pain  Home Living                                          Prior Functioning/Environment              Frequency  Min 2X/week        Progress Toward Goals  OT Goals(current goals can now be found in the care plan section)  Progress towards OT goals: Progressing toward goals  Acute Rehab OT Goals Patient Stated Goal: To return home. OT Goal Formulation: With patient Time For Goal Achievement: 11/17/20 Potential to Achieve Goals: [place] Discharge plan remains appropriate;Frequency remains appropriate    Co-evaluation                 AM-PAC OT "6 Clicks" Daily  Activity     Outcome Measure   Help from another person eating meals?: None Help from another person taking care of personal grooming?: None Help from another person toileting, which includes using toliet, bedpan, or urinal?: None Help from another person bathing (including washing, rinsing, drying)?: A Little Help from another person to put on and taking off regular upper body clothing?: None Help from another person to put on and taking off regular lower body clothing?: None 6 Click Score: 23    End of Session    OT Visit Diagnosis: Muscle weakness (generalized) (M62.81);Dizziness and giddiness (R42)   Activity Tolerance Patient tolerated treatment well   Patient Left in chair;with call bell/phone within reach   Nurse Communication Mobility status        Time: 1000-1017 OT Time Calculation (min): 17 min  Charges: OT General Charges $OT Visit: 1 Visit OT Treatments $Self Care/Home Management : 8-22 mins Harley Alto., COTA/L Acute Rehabilitation Services [phone]    Precious Haws 11/12/2020, 1:07 PM

## 2020-11-13 DIAGNOSIS — N186 End stage renal disease: Secondary | ICD-10-CM

## 2020-11-13 DIAGNOSIS — Z992 Dependence on renal dialysis: Secondary | ICD-10-CM

## 2020-11-13 LAB — RENAL FUNCTION PANEL
Albumin: 2.6 g/dL — ABNORMAL LOW (ref 3.5–5.0)
Anion gap: 12 (ref 5–15)
BUN: 66 mg/dL — ABNORMAL HIGH (ref 6–20)
CO2: 24 mmol/L (ref 22–32)
Calcium: 8.1 mg/dL — ABNORMAL LOW (ref 8.9–10.3)
Chloride: 98 mmol/L (ref 98–111)
Creatinine, Ser: 9.86 mg/dL — ABNORMAL HIGH (ref 0.61–1.24)
GFR, Estimated: 6 mL/min — ABNORMAL LOW (ref >60)
Glucose, Bld: 109 mg/dL — ABNORMAL HIGH (ref 70–99)
Phosphorus: 5.7 mg/dL — ABNORMAL HIGH (ref 2.5–4.6)
Potassium: 3.7 mmol/L (ref 3.5–5.1)
Sodium: 134 mmol/L — ABNORMAL LOW (ref 135–145)

## 2020-11-13 LAB — CBC
HCT: 23.2 % — ABNORMAL LOW (ref 39.0–52.0)
Hemoglobin: 7.2 g/dL — ABNORMAL LOW (ref 13.0–17.0)
MCH: 28.6 pg (ref 26.0–34.0)
MCHC: 31 g/dL (ref 30.0–36.0)
MCV: 92.1 fL (ref 80.0–100.0)
Platelets: 238 10*3/uL (ref 150–400)
RBC: 2.52 MIL/uL — ABNORMAL LOW (ref 4.22–5.81)
RDW: 13.1 % (ref 11.5–15.5)
WBC: 5.8 10*3/uL (ref 4.0–10.5)
nRBC: 0 % (ref 0.0–0.2)

## 2020-11-13 MED ORDER — DARBEPOETIN ALFA 60 MCG/0.3ML IJ SOSY
PREFILLED_SYRINGE | INTRAMUSCULAR | Status: AC
  Administered 2020-11-13: 60 ug via SUBCUTANEOUS
  Filled 2020-11-13: qty 0.3

## 2020-11-13 MED ORDER — DARBEPOETIN ALFA 40 MCG/0.4ML IJ SOSY
PREFILLED_SYRINGE | INTRAMUSCULAR | Status: AC
  Filled 2020-11-13: qty 0.4

## 2020-11-13 MED ORDER — CALCITRIOL 0.25 MCG PO CAPS: 0.2500 ug | ORAL_CAPSULE | Freq: Every day | ORAL | 2 refills | Status: AC

## 2020-11-13 MED ORDER — HEPARIN SODIUM (PORCINE) 1000 UNIT/ML IJ SOLN
INTRAMUSCULAR | Status: AC
  Filled 2020-11-13: qty 4

## 2020-11-13 MED ORDER — FINASTERIDE 5 MG PO TABS: 5.0000 mg | ORAL_TABLET | Freq: Every day | ORAL | 2 refills | Status: AC

## 2020-11-13 MED ORDER — DARBEPOETIN ALFA 60 MCG/0.3ML IJ SOSY: 60.0000 ug | PREFILLED_SYRINGE | INTRAMUSCULAR | Status: DC

## 2020-11-13 MED ORDER — AMLODIPINE BESYLATE 5 MG PO TABS: 5.0000 mg | ORAL_TABLET | Freq: Every day | ORAL | 2 refills | Status: AC

## 2020-11-13 MED ORDER — CALCIUM ACETATE (PHOS BINDER) 667 MG/5ML PO SOLN: 1334.0000 mg | Freq: Three times a day (TID) | ORAL | 2 refills | Status: AC

## 2020-11-13 MED ORDER — COVID-19 MRNA VAC-TRIS(PFIZER) 30 MCG/0.3ML IM SUSP
0.3000 mL | Freq: Once | INTRAMUSCULAR | Status: AC
  Administered 2020-11-13: 0.3 mL via INTRAMUSCULAR
  Filled 2020-11-13: qty 0.3

## 2020-11-13 MED ORDER — TAMSULOSIN HCL 0.4 MG PO CAPS: 0.4000 mg | ORAL_CAPSULE | Freq: Every day | ORAL | 2 refills | Status: AC

## 2020-11-13 MED ORDER — DARBEPOETIN ALFA 40 MCG/0.4ML IJ SOSY: 40.0000 ug | PREFILLED_SYRINGE | Freq: Once | INTRAMUSCULAR | Status: DC

## 2020-11-13 NOTE — Progress Notes (Signed)
PROGRESS NOTE    Daniel Mccarty  [id number]:[id number] DOB: 1966/03/28 DOA: 11/01/2020 PCP: Patient, No Pcp Per (Inactive)    Brief Narrative:  Daniel Mccarty is a 55 year old male with past medical history significant for essential hypertension, CKD stage IV, urinary retention, BPH who presented to [facility] ED on 6/2 at the recommendation of his PCP due to hypertensive urgency with SBP in the 200s.  Patient has not seen a provider for more than 2 years and has been off his antihypertensives for the last 2 years since he has had no refills.  Patient reports progressive fatigue.  In the ED, patient was noted to have a BUN of 239 with a creatinine of 33, serum bicarbonate less than 7, hemoglobin 5.8, platelets 207.  Bicarb drip.  Nephrology was consulted.  TRH consulted for further evaluation and management of hypertensive urgency, and progression of renal failure.   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  Found to have elevated BUN of 239 with a creatinine of 33.  SBP in the 200s on ED arrival.  Patient has been off antihypertensive treatment for the last 2 years due to noncompliance with outpatient visits.  Renal ultrasound with bilateral hydronephrosis with distended urinary bladder with associated renal cortical thinning and increased parenchymal echogenicity suggesting chronic cause.  Patient has left radiocephalic fistula placed by vascular surgery, Dr. Scot Dock on 11/09/2020. --Nephrology/vascular surgery following, appreciate assistance --Continue HD per nephrology, on TTS schedule now --Outpatient follow-up with vascular surgery for fistula  duplex 4-6 weeks --pending outpatient HD placement  Hypertensive urgency: Resolved Patient presenting with SBP in the 200s.  Has been off of antihypertensives for 2 years due to noncompliance with medical therapy. BP 109/72 this morning --Amlodipine 5 mg p.o. daily  Obstructive uropathy BPH Ultrasound on 6/2 with marked bilateral hydronephrosis with renal cortical thinning and increased parenchymal echogenicity suggesting chronic, distended urinary bladder and mild bladder wall thickening.  Etiology likely secondary to chronic BPH causing obstruction and now superimposed medical renal disease.  Foley catheter initially placed which was discontinued on 11/05/2020; now replaced 6/8 due to recurrent high PVRs. --Tamsulosin 0.4 mg p.o. daily --Finasteride 5 mg p.o. daily --continue foley catheter, will need follow-up outpatient with urology  Anemia of chronic renal disease Hemoglobin 5.8 on admission.  Transfuse 2 unit PRBC on 6/2, 1 unit 6/6, and 1 unit 6/11.  Iron 123, TIBC 206, ferritin 374, folate 7.1, vitamin B12 1063. --Hgb 5.8>>>7.7>8.0>7.1>6.6>8.5>7.4>7.4>7.2 --Transfuse for hemoglobin less than 7.0  Hypocalcemia Received 2 g calcium gluconate on 6/2 and 1 g calcium gluconate on 6/6. --Calcium 8.1 today  Moderate protein calorie malnutrition: Body mass index is 24.99 kg/m. Nutrition Status: Nutrition Problem: Moderate Malnutrition Etiology: chronic illness (CKD) Signs/Symptoms: mild muscle depletion, mild fat depletion Interventions: Boost Breeze  --Dietitian following, appreciate assistance --Continue to encourage increased oral intake, supplements     DVT prophylaxis: heparin injection 5,000 Units Start: 11/01/20 2200 SCDs Start: 11/01/20 1941   Code Status: Full Code Family Communication: None present at bedside this morning  Disposition Plan:  Level of care: Med-Surg Status is: Inpatient  Remains inpatient appropriate because:Ongoing diagnostic testing needed  not  appropriate for outpatient work up, Unsafe d/c plan, IV treatments appropriate due to intensity of illness or inability to take PO and Inpatient level of care appropriate due to severity of illness   Dispo:  Patient From: Home  Planned Disposition: Home  Medically stable for discharge: No       Consultants:  Nephrology Vascular surgery Interventional radiology  Procedures:  HD Catheter placement, IR 6/3 Foley catheter placement 6/8 Radiocephalic fistula placement 6/10, Dr. Scot Dock  Antimicrobials:  None   Subjective: Patient seen and examined at bedside, resting comfortably.  Eating breakfast. No complaints.    Plan for HD today. Awaiting for outpatient HD spot.  Denies headache, no dizziness, no fever/chills/night sweats, no nausea/vomiting/diarrhea, no chest pain, no palpitations, no shortness of breath, no abdominal pain, no weakness, no fatigue, no paresthesias.  No other acute events overnight per nursing staff.  Objective: Vitals:   11/13/20 0900 11/13/20 0910 11/13/20 0930 11/13/20 1000  BP: (!) 102/55  (!) 93/58 101/66  Pulse: 87 87 83 78  Resp: (!) 0 16 (!) 0 (!) 0  Temp:      TempSrc:      SpO2: 97% 98% 100% 99%  Weight:      Height:        Intake/Output Summary (Last 24 hours) at 11/13/2020 1032 Last data filed at 11/13/2020 0614 Gross per 24 hour  Intake 240 ml  Output 1050 ml  Net -810 ml   Filed Weights   11/12/20 0500 11/13/20 0608 11/13/20 0832  Weight: 84.1 kg 85.8 kg 85.9 kg    Examination:  General exam: Appears calm and comfortable  Respiratory system: Clear to auscultation. Respiratory effort normal.  On room air Cardiovascular system: S1 & S2 heard, RRR. No JVD, murmurs, rubs, gallops or clicks. No pedal edema.  Tunneled HD catheter noted right chest. Gastrointestinal system: Abdomen is nondistended, soft and nontender. No organomegaly or masses felt. Normal bowel sounds heard. GU: Foley noted in place, clear yellow urine  draining. Central nervous system: Alert and oriented. No focal neurological deficits. Extremities: Symmetric 5 x 5 power.  Left radiocephalic fistula noted, surgical incisionsclean/dry/intact Skin: No rashes, lesions or ulcers Psychiatry: Judgement and insight appear normal. Mood & affect appropriate.     Data Reviewed: I have personally reviewed following labs and imaging studies  CBC: Recent Labs  Lab 11/10/20 0244 11/10/20 0946 11/11/20 0135 11/12/20 0426 11/13/20 0202  WBC 6.9 8.2 5.7 4.9 5.8  HGB 6.6* 8.5* 7.4* 7.4* 7.2*  HCT 20.5* 26.7* 23.3* 24.1* 23.2*  MCV 87.6 89.9 90.0 92.3 92.1  PLT 219 270 229 235 741   Basic Metabolic Panel: Recent Labs  Lab 11/07/20 0317 11/08/20 0350 11/09/20 0423 11/10/20 0244 11/11/20 0135 11/12/20 0426 11/13/20 0202  NA 133*   < > 136 135 135 135 134*  K 4.0   < > 4.3 4.4 4.0 4.3 3.7  CL 99   < > 101 102 100 100 98  CO2 19*   < > 22 20* 28 23 24   GLUCOSE 108*   < > 100* 165* 125* 100* 109*  BUN 95*   < > 62* 70* 32* 52* 66*  CREATININE 12.95*   < > 9.06* 10.20* 6.31* 8.30* 9.86*  CALCIUM 6.6*   < > 7.5* 7.9* 8.2* 8.2* 8.1*  MG 1.5*  --   --   --   --   --   --   PHOS 4.4   < > 5.0* 3.8 4.5  5.8* 5.7*   < > = values in this interval not displayed.   GFR: Estimated Creatinine Clearance: 9.7 mL/min (A) (by C-G formula based on SCr of 9.86 mg/dL (H)). Liver Function Tests: Recent Labs  Lab 11/09/20 0423 11/10/20 0244 11/11/20 0135 11/12/20 0426 11/13/20 0202  ALBUMIN 2.7* 2.5* 2.6* 2.5* 2.6*   No results for input(s): LIPASE, AMYLASE in the last 168 hours. No results for input(s): AMMONIA in the last 168 hours. Coagulation Profile: No results for input(s): INR, PROTIME in the last 168 hours. Cardiac Enzymes: No results for input(s): CKTOTAL, CKMB, CKMBINDEX, TROPONINI in the last 168 hours. BNP (last 3 results) No results for input(s): PROBNP in the last 8760 hours. HbA1C: No results for input(s): HGBA1C in the last 72  hours. CBG: No results for input(s): GLUCAP in the last 168 hours. Lipid Profile: No results for input(s): CHOL, HDL, LDLCALC, TRIG, CHOLHDL, LDLDIRECT in the last 72 hours. Thyroid Function Tests: No results for input(s): TSH, T4TOTAL, FREET4, T3FREE, THYROIDAB in the last 72 hours. Anemia Panel: No results for input(s): VITAMINB12, FOLATE, FERRITIN, TIBC, IRON, RETICCTPCT in the last 72 hours. Sepsis Labs: No results for input(s): PROCALCITON, LATICACIDVEN in the last 168 hours.  No results found for this or any previous visit (from the past 240 hour(s)).        Radiology Studies: No results found.       Scheduled Meds:  sodium chloride   Intravenous Once   amLODipine  5 mg Oral Daily   calcitRIOL  0.25 mcg Oral Daily   calcium acetate (Phos Binder)  1,334 mg Oral TID WC   Chlorhexidine Gluconate Cloth  6 each Topical Daily   COVID-19 mRNA vaccine (Moderna)  0.5 mL Intramuscular Once   darbepoetin (ARANESP) injection - NON-DIALYSIS  60 mcg Subcutaneous Q Tue-1800   feeding supplement  1 Container Oral TID BM   finasteride  5 mg Oral Daily   heparin  5,000 Units Subcutaneous Q8H   ondansetron  4 mg Oral Once   senna-docusate  1 tablet Oral QHS   tamsulosin  0.4 mg Oral QPC supper   Continuous Infusions:     LOS: 12 days    Time spent: 34 minutes spent on chart review, discussion with nursing staff, consultants, updating family and interview/physical exam; more than 50% of that time was spent in counseling and/or coordination of care.    Jacorie Ernsberger J [place] DO [facility] Available via Epic secure chat 7am-7pm After these hours, please refer to coverage provider listed on [email or web address] 11/13/2020, 10:32 AM

## 2020-11-13 NOTE — Progress Notes (Signed)
[facility] Progress Note   Assessment/ Plan:   New ESRD - multifactorial with chronic HTN as well as BPH and urinary retention/obstruction.  Renal US with marked bilateral hydronephrosis.  Significant improvement with Foley catheter, but still with significantly elevated BUN/Cr.  VVS consulted and had left radiocephalic AVF creation 6/43/32 by Dr. Scot Dock.  Good thrill and bruit. - Good UOP with foley. - Awaiting outpatient HD spot as he has progressed to ESRD.   - Continue with TTS schedule for now. - Discussed transplant and home therapies today- he has a full time job and is very functional- would be a good candidate for both.  Urinary obstruction - bilateral hydronephrosis, present since 2020 but progressed since then.  Likely chronic.  Foley catheter removed.  On Flomax and Proscar. 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  Receiving IV calcium and started on calcium acetate binders qac.  PTH elevated at 406 and will start calcitriol. Anemia of CKD stage IV-V:  S/p blood transfusion and now on ESA increase to 100 mcg q Tuesday Vascular access:  Had RIJ [place] placed 11/02/20 and left radiocephalic avf creation 9/51/88 by Dr. Scot Dock. Hypertensive urgency:  Improved BP with meds, had not taken any for the past 2 years. Dispo: OK to go once has a dialysis OP chair  Subjective:    No big changes.  Feeling well.  Seen on dialysis.  BFR 300 mL/ min via TDC, UF goal 1 L.   Objective:   BP 111/73   Pulse 80   Temp 98.6 F (37 C)   Resp 12   Ht 6\' 1"  (1.854 m)   Wt 85.5 kg   SpO2 99%   BMI 24.87 kg/m   Physical Exam: Gen: NAD, sitting in bed CVS: RRR Resp: clear Abd: soft Ext: trace LE edema ACCESS: R IJ TDC, L forearm AVF + T/B  Labs: BMET Recent Labs  Lab 11/07/20 0317 11/08/20 0350 11/09/20 0423 11/10/20 0244 11/11/20 0135 11/12/20 0426 11/13/20 0202  NA 133* 135 136 135 135 135 134*  K 4.0 3.9 4.3 4.4 4.0 4.3 3.7  CL 99 99 101 102 100 100 98   CO2 19* 25 22 20* 28 23 24   GLUCOSE 108* 102* 100* 165* 125* 100* 109*  BUN 95* 51* 62* 70* 32* 52* 66*  CREATININE 12.95* 7.98* 9.06* 10.20* 6.31* 8.30* 9.86*  CALCIUM 6.6* 7.4* 7.5* 7.9* 8.2* 8.2* 8.1*  PHOS 4.4 3.9 5.0* 3.8 4.5 5.8* 5.7*   CBC Recent Labs  Lab 11/10/20 0946 11/11/20 0135 11/12/20 0426 11/13/20 0202  WBC 8.2 5.7 4.9 5.8  HGB 8.5* 7.4* 7.4* 7.2*  HCT 26.7* 23.3* 24.1* 23.2*  MCV 89.9 90.0 92.3 92.1  PLT 270 229 235 238      Medications:     sodium chloride   Intravenous Once   amLODipine  5 mg Oral Daily   calcitRIOL  0.25 mcg Oral Daily   calcium acetate (Phos Binder)  1,334 mg Oral TID WC   Chlorhexidine Gluconate Cloth  6 each Topical Daily   COVID-19 mRNA vaccine (Moderna)  0.5 mL Intramuscular Once   Darbepoetin Alfa       darbepoetin (ARANESP) injection - DIALYSIS  40 mcg Intravenous Once   darbepoetin (ARANESP) injection - NON-DIALYSIS  60 mcg Subcutaneous Q Tue-1800   feeding supplement  1 Container Oral TID BM   finasteride  5 mg Oral Daily   heparin  5,000 Units Subcutaneous Q8H   heparin sodium (porcine)  ondansetron  4 mg Oral Once   senna-docusate  1 tablet Oral QHS   tamsulosin  0.4 mg Oral QPC supper     Madelon Lips MD 11/13/2020, 1:34 PM

## 2020-11-13 NOTE — Discharge Summary (Signed)
Physician Discharge Summary  Daniel Mccarty NIO:[id number] DOB: 07-31-1965 DOA: 11/01/2020  PCP: Patient, No Pcp Per (Inactive)  Admit date: 11/01/2020 Discharge date: 11/13/2020  Admitted From: Home Disposition: Home  Recommendations for Outpatient Follow-up:  Follow up with PCP in 1-2 weeks Follow-up with nephrology as scheduled Outpatient follow-up with vascular surgery Will need outpatient follow-up with urology for voiding trial for obstructive uropathy, scheduled on December 07, 2020 at [place].   Home Health: No Equipment/Devices: foley, Tunneled HD catheter  Discharge Condition: Stable CODE STATUS: Full code Diet recommendation: Renal diet with 1200 mL fluid restriction daily  History of present illness:  Daniel Mccarty is a 55 year old male with past medical history significant for essential hypertension, CKD stage IV, urinary retention, BPH who presented to [facility], ED on 6/2 at the recommendation of his PCP due to hypertensive urgency with SBP in the 200s.  Patient has not seen a provider for more than 2 years and has been off his antihypertensives for the last 2 years since he has had no refills.  Patient reports progressive fatigue.  In the ED, patient was noted to have a BUN of 239 with a creatinine of 33, serum bicarbonate less than 7, hemoglobin 5.8, platelets 207.  Bicarb drip.  Nephrology was consulted.  TRH consulted for further evaluation and management of hypertensive urgency, and progression of renal failure.  Hospital course:  Acute renal failure on CKD stage IV, now progressed likely to ESRD Patient presenting to the ED with progressive fatigue.  Found to have elevated BUN of 239 with a creatinine of 33.  SBP in the 200s on ED arrival.  Patient has been off antihypertensive treatment for the last 2 years due to noncompliance with outpatient visits.  Renal ultrasound with bilateral hydronephrosis with distended urinary bladder with associated renal cortical thinning and  increased parenchymal echogenicity suggesting chronic cause.  Patient has left radiocephalic fistula placed by vascular surgery, Dr. Scot Mccarty on 11/09/2020.  Patient will continue on a TTS schedule outpatient for hemodialysis.  Patient will need follow-up with vascular surgery for fistula duplex in 4-6 weeks.   Hypertensive urgency: Resolved Patient presenting with SBP in the 200s.  Has been off of antihypertensives for 2 years due to noncompliance with medical therapy.  Continue amlodipine 5 mg p.o. daily.   Obstructive uropathy BPH Ultrasound on 6/2 with marked bilateral hydronephrosis with renal cortical thinning and increased parenchymal echogenicity suggesting chronic, distended urinary bladder and mild bladder wall thickening.  Etiology likely secondary to chronic BPH causing obstruction and now superimposed medical renal disease.  Foley catheter initially placed which was discontinued on 11/05/2020; now replaced 6/8 due to recurrent high PVRs.  Continue tamsulosin 0.4 mg p.o. daily, finasteride 5 mg p.o. daily.  Outpatient follow-up with urology in 2 weeks for repeat voiding trial.   Anemia of chronic renal disease Hemoglobin 5.8 on admission.  Transfused 2 unit PRBC on 6/2, 1 unit 6/6, and 1 unit 6/11.  Iron 123, TIBC 206, ferritin 374, folate 7.1, vitamin B12 1063.    Hypocalcemia Received 2 g calcium gluconate on 6/2 and 1 g calcium gluconate on 6/6. Calcium 8.2 at time of discharge.   Moderate protein calorie malnutrition: Body mass index is 24.46 kg/m. Nutrition Status: Nutrition Problem: Moderate Malnutrition Etiology: chronic illness (CKD) Signs/Symptoms: mild muscle depletion, mild fat depletion Interventions: Boost Breeze  Followed by dietitian during inpatient hospitalization.  Encourage increased oral intake with utilization of supplements.    Discharge Diagnoses:  Active Problems:   Acute  renal insufficiency   Bladder outlet obstruction   Essential hypertension   AKI  (acute kidney injury) (HCC)   BPH (benign prostatic hyperplasia)   Anemia of chronic disease   Malnutrition of moderate degree   ESRD (end stage renal disease) on dialysis [facility])    Discharge Instructions  Discharge Instructions     Call MD for:  difficulty breathing, headache or visual disturbances   Complete by: As directed    Call MD for:  extreme fatigue   Complete by: As directed    Call MD for:  persistant dizziness or light-headedness   Complete by: As directed    Call MD for:  persistant nausea and vomiting   Complete by: As directed    Call MD for:  severe uncontrolled pain   Complete by: As directed    Call MD for:  temperature >100.4   Complete by: As directed    Continue foley catheter   Complete by: As directed    Diet general   Complete by: As directed    Renal diet with 1270mL fluid restriction   Increase activity slowly   Complete by: As directed    Leg bag during the day   Complete by: As directed    No wound care   Complete by: As directed       Allergies as of 11/13/2020   No Known Allergies      Medication List     STOP taking these medications    ondansetron 4 MG tablet Commonly known as: ZOFRAN   silodosin 4 MG Caps capsule Commonly known as: RAPAFLO Replaced by: tamsulosin 0.4 MG Caps capsule       TAKE these medications    amLODipine 5 MG tablet Commonly known as: NORVASC Take 1 tablet (5 mg total) by mouth daily.   calcitRIOL 0.25 MCG capsule Commonly known as: ROCALTROL Take 1 capsule (0.25 mcg total) by mouth daily. Start taking on: November 14, 2020   calcium acetate (Phos Binder) 667 MG/5ML Soln Commonly known as: PHOSLYRA Take 10 mLs (1,334 mg total) by mouth 3 (three) times daily with meals.   Darbepoetin Alfa 60 MCG/0.3ML Sosy injection Commonly known as: ARANESP Inject 0.3 mLs (60 mcg total) into the skin every Tuesday at 6 PM. Start taking on: November 20, 2020   finasteride 5 MG tablet Commonly known as:  PROSCAR Take 1 tablet (5 mg total) by mouth daily. Start taking on: November 14, 2020   tamsulosin 0.4 MG Caps capsule Commonly known as: FLOMAX Take 1 capsule (0.4 mg total) by mouth daily after supper. Replaces: silodosin 4 MG Caps capsule        Follow-up Information     Angelia Mould, MD Follow up.   Specialties: Vascular Surgery, Cardiology Why: office will call you to arrange your appt (sent) Contact information: [place] [phone]         [facility]. Schedule an appointment as soon as possible for a visit in 2 week(s).   Contact information: [place] [phone]               No Known Allergies  Consultations: Nephrology Vascular surgery Interventional radiology   Procedures/Studies: US RENAL  Result Date: 11/01/2020 CLINICAL DATA:  Acute kidney injury. EXAM: RENAL / URINARY TRACT ULTRASOUND COMPLETE COMPARISON:  Abdominopelvic CT 08/14/2018 FINDINGS: Right Kidney: Renal measurements: 11.4 x 6.1 x 5.3 cm = volume: 194 mL. Marked  hydronephrosis. Mild thinning of the renal parenchyma with increased parenchymal echogenicity. No evidence of stones or focal lesion. Left Kidney: Renal measurements: 9.3 x 5.2 x 4.2 cm = volume: 107 mL. Marked hydronephrosis. Moderate thinning of the renal parenchyma with mild increased echogenicity. No evidence of stones or focal lesion. Bladder: Distended with prevoid bladder volume of 708 cc. Internal debris/low-level echoes. Patient had difficulty voiding, and postvoid bladder volume actually increased, 773 cc. There is mild diffuse bladder wall thickening. Other: None. IMPRESSION: 1. Marked bilateral hydronephrosis with renal cortical thinning and increased parenchymal echogenicity, suggesting this is chronic. The degree of hydronephrosis has progressed from March 2020 abdominal CT. 2. Distended urinary bladder. Postvoid bladder volume  actually increased as patient was unable to void, with postvoid bladder volume of 773 cc. 3. Internal bladder debris which may be due to stasis or infection. Mild bladder wall thickening. Electronically Signed   By: Keith Rake M.D.   On: 11/01/2020 20:08   IR Fluoro Guide CV Line Right  Result Date: 11/02/2020 CLINICAL DATA:  Progressive renal insufficiency, needs access for hemodialysis EXAM: TUNNELED HEMODIALYSIS CATHETER PLACEMENT WITH ULTRASOUND AND FLUOROSCOPIC GUIDANCE TECHNIQUE: The procedure, risks, benefits, and alternatives were explained to the patient. Questions regarding the procedure were encouraged and answered. The patient understands and consents to the procedure. As antibiotic prophylaxis, cefazolin 2 g was ordered pre-procedure and administered intravenously within one hour of incision.Patency of the right IJ vein was confirmed with ultrasound with image documentation. An appropriate skin site was determined. Region was prepped using maximum barrier technique including cap and mask, sterile gown, sterile gloves, large sterile sheet, and Chlorhexidine as cutaneous antisepsis. The region was infiltrated locally with 1% lidocaine. Intravenous Fentanyl 7mcg and Versed 1mg  were administered as conscious sedation during continuous monitoring of the patient's level of consciousness and physiological / cardiorespiratory status by the radiology RN, with a total moderate sedation time of 15 minutes. Under real-time ultrasound guidance, the right IJ vein was accessed with a 21 gauge micropuncture needle; the needle tip within the vein was confirmed with ultrasound image documentation. Needle exchanged over the 018 guidewire for transitional dilator, which allowed advancement of a Benson wire into the IVC. Over this, an MPA catheter was advanced. A Palindrome 23 hemodialysis catheter was tunneled from the right anterior chest wall approach to the right IJ dermatotomy site. The MPA catheter was  exchanged over an Amplatz wire for serial vascular dilators which allow placement of a peel-away sheath, through which the catheter was advanced under intermittent fluoroscopy, positioned with its tips in the proximal and midright atrium. Spot chest radiograph confirms good catheter position. No pneumothorax. Catheter was flushed and primed per protocol. Catheter secured externally with O Prolene sutures. The right IJ dermatotomy site was closed with Dermabond. COMPLICATIONS: COMPLICATIONS None immediate FLUOROSCOPY TIME:  48 seconds; 2 mGy COMPARISON:  None IMPRESSION: 1. Technically successful placement of tunneled right IJ hemodialysis catheter with ultrasound and fluoroscopic guidance. Ready for routine use. Electronically Signed   By: Lucrezia Europe M.D.   On: 11/02/2020 13:41   IR US Guide Vasc Access Right  Result Date: 11/02/2020 CLINICAL DATA:  Progressive renal insufficiency, needs access for hemodialysis EXAM: TUNNELED HEMODIALYSIS CATHETER PLACEMENT WITH ULTRASOUND AND FLUOROSCOPIC GUIDANCE TECHNIQUE: The procedure, risks, benefits, and alternatives were explained to the patient. Questions regarding the procedure were encouraged and answered. The patient understands and consents to the procedure. As antibiotic prophylaxis, cefazolin 2 g was ordered pre-procedure and administered intravenously within one hour of incision.Patency  of the right IJ vein was confirmed with ultrasound with image documentation. An appropriate skin site was determined. Region was prepped using maximum barrier technique including cap and mask, sterile gown, sterile gloves, large sterile sheet, and Chlorhexidine as cutaneous antisepsis. The region was infiltrated locally with 1% lidocaine. Intravenous Fentanyl 59mcg and Versed 1mg  were administered as conscious sedation during continuous monitoring of the patient's level of consciousness and physiological / cardiorespiratory status by the radiology RN, with a total moderate sedation  time of 15 minutes. Under real-time ultrasound guidance, the right IJ vein was accessed with a 21 gauge micropuncture needle; the needle tip within the vein was confirmed with ultrasound image documentation. Needle exchanged over the 018 guidewire for transitional dilator, which allowed advancement of a Benson wire into the IVC. Over this, an MPA catheter was advanced. A Palindrome 23 hemodialysis catheter was tunneled from the right anterior chest wall approach to the right IJ dermatotomy site. The MPA catheter was exchanged over an Amplatz wire for serial vascular dilators which allow placement of a peel-away sheath, through which the catheter was advanced under intermittent fluoroscopy, positioned with its tips in the proximal and midright atrium. Spot chest radiograph confirms good catheter position. No pneumothorax. Catheter was flushed and primed per protocol. Catheter secured externally with O Prolene sutures. The right IJ dermatotomy site was closed with Dermabond. COMPLICATIONS: COMPLICATIONS None immediate FLUOROSCOPY TIME:  48 seconds; 2 mGy COMPARISON:  None IMPRESSION: 1. Technically successful placement of tunneled right IJ hemodialysis catheter with ultrasound and fluoroscopic guidance. Ready for routine use. Electronically Signed   By: Lucrezia Europe M.D.   On: 11/02/2020 13:41   DG Chest Portable 1 View  Result Date: 11/01/2020 CLINICAL DATA:  Acute kidney injury, hypertension EXAM: PORTABLE CHEST 1 VIEW COMPARISON:  None. FINDINGS: The heart size and mediastinal contours are within normal limits. Both lungs are clear. The visualized skeletal structures are unremarkable. IMPRESSION: Negative. Electronically Signed   By: Rolm Baptise M.D.   On: 11/01/2020 18:20   VAS [place] UPPER EXT VEIN MAPPING (PRE-OP AVF)  Result Date: 11/07/2020 [place] MAPPING Patient Name:  ALDAN CAMEY  Date of Exam:   11/07/2020 Medical Rec #: [id number]       Accession #:    [id number] Date of Birth: 23-Sep-1965        Patient Gender: M Patient Age:   55Y Exam Location:  [facility] Procedure:      VAS [place] UPPER EXT VEIN MAPPING (PRE-OP AVF) Referring Phys: [id number] Yevonne Aline HAWKEN --------------------------------------------------------------------------------  Indications: Pre-access. History: CKD.  Comparison Study: No prior study Performing Technologist: Maudry Mayhew MHA, RDMS, RVT, RDCS  Examination Guidelines: A complete evaluation includes B-mode imaging, spectral Doppler, color Doppler, and power Doppler as needed of all accessible portions of each vessel. Bilateral testing is considered an integral part of a complete examination. Limited examinations for reoccurring indications may be performed as noted. +-----------------+-------------+----------+-------------------+ Right Cephalic   Diameter (cm)Depth (cm)     Findings       +-----------------+-------------+----------+-------------------+ Shoulder             0.22        0.49                       +-----------------+-------------+----------+-------------------+ Prox upper arm       0.22        0.47                       +-----------------+-------------+----------+-------------------+  Mid upper arm        0.20        0.29                       +-----------------+-------------+----------+-------------------+ Dist upper arm       0.22        0.19                       +-----------------+-------------+----------+-------------------+ Antecubital fossa    0.24        0.31                       +-----------------+-------------+----------+-------------------+ Prox forearm         0.16        0.30                       +-----------------+-------------+----------+-------------------+ Mid forearm          0.13        0.26        branching      +-----------------+-------------+----------+-------------------+ Wrist                                   not visualized (IV)  +-----------------+-------------+----------+-------------------+ +-----------------+-------------+----------+---------+ Right Basilic    Diameter (cm)Depth (cm)Findings  +-----------------+-------------+----------+---------+ Mid upper arm        0.37                         +-----------------+-------------+----------+---------+ Dist upper arm       0.46                         +-----------------+-------------+----------+---------+ Antecubital fossa    0.22               branching +-----------------+-------------+----------+---------+ Prox forearm         0.14                         +-----------------+-------------+----------+---------+ Mid forearm          0.13                         +-----------------+-------------+----------+---------+ Distal forearm       0.14                         +-----------------+-------------+----------+---------+ Wrist                0.13                         +-----------------+-------------+----------+---------+ +-----------------+-------------+----------+---------+ Left Cephalic    Diameter (cm)Depth (cm)Findings  +-----------------+-------------+----------+---------+ Shoulder             0.19        0.37             +-----------------+-------------+----------+---------+ Prox upper arm       0.19        0.47             +-----------------+-------------+----------+---------+ Mid upper arm        0.14        0.29             +-----------------+-------------+----------+---------+ Dist upper  arm       0.13        0.32             +-----------------+-------------+----------+---------+ Antecubital fossa    0.18        0.30             +-----------------+-------------+----------+---------+ Prox forearm         0.16        0.23   branching +-----------------+-------------+----------+---------+ Mid forearm          0.17        0.20             +-----------------+-------------+----------+---------+ Dist forearm          0.12        0.19             +-----------------+-------------+----------+---------+ Wrist                0.06        0.22             +-----------------+-------------+----------+---------+ +-----------------+-------------+----------+---------+ Left Basilic     Diameter (cm)Depth (cm)Findings  +-----------------+-------------+----------+---------+ Prox upper arm       0.59                         +-----------------+-------------+----------+---------+ Mid upper arm        0.28               branching +-----------------+-------------+----------+---------+ Dist upper arm       0.31                         +-----------------+-------------+----------+---------+ Antecubital fossa    0.41                         +-----------------+-------------+----------+---------+ Prox forearm         0.18               branching +-----------------+-------------+----------+---------+ Mid forearm          0.11                         +-----------------+-------------+----------+---------+ Distal forearm       0.08                         +-----------------+-------------+----------+---------+ Wrist                0.11                         +-----------------+-------------+----------+---------+ *See table(s) above for measurements and observations.  Diagnosing physician: Jamelle Haring Electronically signed by Jamelle Haring on 11/07/2020 at 7:17:53 PM.    Final      Subjective: Patient seen examined bedside, resting comfortably.  Returned from HD.  Nephrology now has set up outpatient HD and ready for discharge.  No other questions or concerns at this time.  Patient denies headache, no fever/chills/night sweats, no nausea/vomiting/diarrhea, no chest pain, no palpitations, no shortness of breath, no abdominal pain, no weakness, no fatigue, no paresthesias.  No acute events overnight per nursing staff.  Discharge Exam: Vitals:   11/13/20 1214 11/13/20 1249  BP: 105/63 111/73   Pulse: 80   Resp: 15 12  Temp: 98.7 F (37.1 C) 98.6 F (37 C)  SpO2: 99%  Vitals:   11/13/20 1100 11/13/20 1130 11/13/20 1214 11/13/20 1249  BP: 109/66 101/64 105/63 111/73  Pulse: 81 75 80   Resp: 15 10 15 12   Temp:   98.7 F (37.1 C) 98.6 F (37 C)  TempSrc:   Oral   SpO2: 100% 100% 99%   Weight:   85.5 kg   Height:        General: Pt is alert, awake, not in acute distress Cardiovascular: RRR, S1/S2 +, no rubs, no gallops, HD tunneled catheter noted right chest Respiratory: CTA bilaterally, no wheezing, no rhonchi Abdominal: Soft, NT, ND, bowel sounds + GU: Foley catheter noted draining clear yellow urine Extremities: no edema, no cyanosis    The results of significant diagnostics from this hospitalization (including imaging, microbiology, ancillary and laboratory) are listed below for reference.     Microbiology: No results found for this or any previous visit (from the past 240 hour(s)).   Labs: BNP (last 3 results) No results for input(s): BNP in the last 8760 hours. Basic Metabolic Panel: Recent Labs  Lab 11/07/20 0317 11/08/20 0350 11/09/20 0423 11/10/20 0244 11/11/20 0135 11/12/20 0426 11/13/20 0202  NA 133*   < > 136 135 135 135 134*  K 4.0   < > 4.3 4.4 4.0 4.3 3.7  CL 99   < > 101 102 100 100 98  CO2 19*   < > 22 20* 28 23 24   GLUCOSE 108*   < > 100* 165* 125* 100* 109*  BUN 95*   < > 62* 70* 32* 52* 66*  CREATININE 12.95*   < > 9.06* 10.20* 6.31* 8.30* 9.86*  CALCIUM 6.6*   < > 7.5* 7.9* 8.2* 8.2* 8.1*  MG 1.5*  --   --   --   --   --   --   PHOS 4.4   < > 5.0* 3.8 4.5 5.8* 5.7*   < > = values in this interval not displayed.   Liver Function Tests: Recent Labs  Lab 11/09/20 0423 11/10/20 0244 11/11/20 0135 11/12/20 0426 11/13/20 0202  ALBUMIN 2.7* 2.5* 2.6* 2.5* 2.6*   No results for input(s): LIPASE, AMYLASE in the last 168 hours. No results for input(s): AMMONIA in the last 168 hours. CBC: Recent Labs  Lab 11/10/20 0244  11/10/20 0946 11/11/20 0135 11/12/20 0426 11/13/20 0202  WBC 6.9 8.2 5.7 4.9 5.8  HGB 6.6* 8.5* 7.4* 7.4* 7.2*  HCT 20.5* 26.7* 23.3* 24.1* 23.2*  MCV 87.6 89.9 90.0 92.3 92.1  PLT 219 270 229 235 238   Cardiac Enzymes: No results for input(s): CKTOTAL, CKMB, CKMBINDEX, TROPONINI in the last 168 hours. BNP: Invalid input(s): POCBNP CBG: No results for input(s): GLUCAP in the last 168 hours. D-Dimer No results for input(s): DDIMER in the last 72 hours. Hgb A1c No results for input(s): HGBA1C in the last 72 hours. Lipid Profile No results for input(s): CHOL, HDL, LDLCALC, TRIG, CHOLHDL, LDLDIRECT in the last 72 hours. Thyroid function studies No results for input(s): TSH, T4TOTAL, T3FREE, THYROIDAB in the last 72 hours.  Invalid input(s): FREET3 Anemia work up No results for input(s): VITAMINB12, FOLATE, FERRITIN, TIBC, IRON, RETICCTPCT in the last 72 hours. Urinalysis    Component Value Date/Time   COLORURINE YELLOW 11/01/2020 1925   APPEARANCEUR HAZY (A) 11/01/2020 1925   LABSPEC 1.011 11/01/2020 1925   PHURINE 6.0 11/01/2020 1925   GLUCOSEU 50 (A) 11/01/2020 1925   HGBUR MODERATE (A) 11/01/2020 1925   BILIRUBINUR NEGATIVE 11/01/2020 1925  KETONESUR NEGATIVE 11/01/2020 1925   PROTEINUR 100 (A) 11/01/2020 1925   UROBILINOGEN 0.2 08/09/2018 1315   NITRITE NEGATIVE 11/01/2020 1925   LEUKOCYTESUR MODERATE (A) 11/01/2020 1925   Sepsis Labs Invalid input(s): PROCALCITONIN,  WBC,  LACTICIDVEN Microbiology No results found for this or any previous visit (from the past 240 hour(s)).   Time coordinating discharge: Over 30 minutes  SIGNED:   Donnamarie Poag [place] DO  [facility] 11/13/2020, 3:31 PM

## 2020-11-13 NOTE — Progress Notes (Signed)
Patient was initially provided with outpatient seat schedule at [place] 12:35pm, however, after Navigator spoke with patient (patient was initially referred by staff covering last week), it is evident that he would be great for Transitional Care Unit and that he is very interested in Home Therapy in the near future. He is interested in [place].  Navigator asked Renal NP to send orders to [facility] for a Thursday start.  Navigator spoke with [facility] [employer] Admissions, as well as Training and development officer and was able to switch patient to TCU to start on Thursday at 8:00am. His schedule will be M/T/Th/F at 8:40am. This change occurred after patient discharge, so Navigator has left message for patient to return call. Navigator will follow up until patient is reached and aware of change.  Alphonzo Cruise, [place] Renal Navigator [phone]

## 2020-11-14 ENCOUNTER — Telehealth: Payer: Self-pay

## 2020-11-14 ENCOUNTER — Telehealth: Payer: Self-pay | Admitting: Nurse Practitioner

## 2020-11-14 ENCOUNTER — Other Ambulatory Visit: Payer: Self-pay | Admitting: *Deleted

## 2020-11-14 DIAGNOSIS — N186 End stage renal disease: Secondary | ICD-10-CM

## 2020-11-14 NOTE — Telephone Encounter (Signed)
Transition of care contact from inpatient facility  Date of Discharge:  Date of Contact: Method of contact: Phone  Attempted to contact patient to discuss transition of care from inpatient admission. Patient did not answer the phone. Message was left on the patient's voicemail with call back number [phone].

## 2020-11-14 NOTE — Telephone Encounter (Signed)
Renal Navigator reached patient by phone to inform of change in clinic schedule. He understands that he will now be part of the Transitional Care Unit [facility]) as we discussed the possibility of yesterday at his discharge.  He will still start in the outpt unit (same location[facility]) on Thursday, 11/15/20, but need to arrive at 8:00am for an 8:40am seat time. His appointments are now M/T/Th/F at 8:40am. He stated appreciation and no questions.  Alphonzo Cruise, [place] Renal Navigator [phone]

## 2020-12-19 ENCOUNTER — Ambulatory Visit (INDEPENDENT_AMBULATORY_CARE_PROVIDER_SITE_OTHER): Payer: BC Managed Care – PPO | Admitting: Physician Assistant

## 2020-12-19 ENCOUNTER — Encounter: Payer: Self-pay | Admitting: Physician Assistant

## 2020-12-19 ENCOUNTER — Ambulatory Visit (HOSPITAL_COMMUNITY)
Admission: RE | Admit: 2020-12-19 | Discharge: 2020-12-19 | Disposition: A | Discharge status: Home / Self Care | Payer: BC Managed Care – PPO | Source: Ambulatory Visit | Attending: Vascular Surgery | Admitting: Vascular Surgery

## 2020-12-19 ENCOUNTER — Other Ambulatory Visit: Payer: Self-pay

## 2020-12-19 VITALS — BP 110/72 | HR 76 | Temp 97.6°F | Resp 20 | Ht 73.0 in | Wt 192.0 lb

## 2020-12-19 DIAGNOSIS — Z992 Dependence on renal dialysis: Secondary | ICD-10-CM

## 2020-12-19 DIAGNOSIS — N186 End stage renal disease: Secondary | ICD-10-CM

## 2020-12-19 NOTE — Progress Notes (Signed)
    Postoperative Access Visit   History of Present Illness   Daniel Mccarty is a 55 y.o. year old male who presents for postoperative follow-up for: left radiocephalic arteriovenous fistula (Date: 11/09/20).  The patient's wounds are healed.  The patient denies steal symptoms.  The patient is able to complete their activities of daily living.  He is dialyzing on a MWF schedule at the [address] located.   Physical Examination   Vitals:   12/19/20 1155  BP: 110/72  Pulse: 76  Resp: 20  Temp: 97.6 F (36.4 C)  TempSrc: Temporal  SpO2: 99%  Weight: 192 lb (87.1 kg)  Height: 6\' 1"  (1.854 m)   Body mass index is 25.33 kg/m.  left arm Incisions are healed, hand grip is 5/5, sensation in digits is intact, easily palpable thrill before the vein branches in the upper forearm, bruit can be auscultated     Medical Decision Making   Daniel Mccarty is a 55 y.o. year old male who presents s/p left radiocephalic arteriovenous fistula  Patent L radiocephalic fistula without signs or symptoms of steal syndrome Cephalic vein branches in the upper forearm however the fistula has matured very well prior to this branching and has a very strong thrill thus we will hold off on any surgical intervention such as branch ligation at this time The patient's access will be ready for use 02/09/21 The patient's tunneled dialysis catheter can be removed when Nephrology is comfortable with the performance of the fistula The patient may follow up on a prn basis   Dagoberto Ligas PA-C [facility] Office: [phone]  Clinic MD: Scot Dock

## 2021-04-09 ENCOUNTER — Other Ambulatory Visit: Payer: Self-pay | Admitting: Urology

## 2021-04-22 ENCOUNTER — Encounter (HOSPITAL_BASED_OUTPATIENT_CLINIC_OR_DEPARTMENT_OTHER): Payer: Self-pay | Admitting: Urology

## 2021-04-23 ENCOUNTER — Encounter (HOSPITAL_BASED_OUTPATIENT_CLINIC_OR_DEPARTMENT_OTHER): Payer: Self-pay | Admitting: Urology

## 2021-04-23 ENCOUNTER — Other Ambulatory Visit: Payer: Self-pay

## 2021-04-23 NOTE — Progress Notes (Addendum)
ADDENDUM:  Reviewed chart w/ anesthesia , Dr Lissa Hoard MDA, via phone.  Dr Lissa Hoard recommends pt have nephrology clearance with recommendation of hemodialysis management ,pt does it at home , due same of surgery, and is scheduled to be overnight.  Pt's dialysis days are M/ T/ Th/ F Called and left message for Jeannene Patella, [place] scheduler for Dr Claudia Desanctis, informed her of anesthesia recommendation.   Spoke w/ via phone for pre-op interview--- pt and pt's sig other, [facility] needs dos----  [employer] results------ current ekg in epic/ chart COVID test -----patient states asymptomatic no test needed Arrive at ------- 0900 on 04-30-2021 NPO after MN NO Solid Food.  Clear liquids from MN until--- 0800 Med rec completed Medications to take morning of surgery ----- norvasc, finasteride, calcotrial Diabetic medication ----- n/a Patient instructed no nail polish to be worn day of surgery Patient instructed to bring photo id and insurance card day of surgery Patient aware to have Driver (ride ) / caregiver  for 24 hours after surgery -- sig other, [facility] [place] Patient Special Instructions ----- reviewed [facility] and visitor guidelines Pre-Op special Istructions -----  Patient verbalized understanding of instructions that were given at this phone interview. Patient denies shortness of breath, chest pain, fever, cough at this phone interview.    Anesthesia Review:  HTN;  ESRD on home hemodialysis M/ T/ Th/ F thru [facility] on [address], has left av fistula.  Hemodialysis started 06/ 2022  Nephrologist:  Dr Joelyn Oms Chest x-ray : 11-01-2020 epic EKG : 11-01-2020 epic Echo : no Stress test: no Cardiac Cath :  no Activity level: denies sob w/ any activity Blood Thinner/ Instructions /Last Dose:no ASA / Instructions/ Last Dose :   no

## 2021-04-29 NOTE — Progress Notes (Addendum)
Spoke with patient by phone and patient states he has not heard from dr sanford nephrology regarding home hemodialysis treatments and recommendations for 04-30-2021 surgery. Patient states he is doing home hemodiialysis now and he will be fine with missing scheduled  home hemodialysis Tuesday 04-30-2021 and he can do hemodialysis as soon as he gets home Wednesday morning, and to call back at 100 pm today for pre op instructions to be given.  Spoke with patient by phone and reviewed pre op instructions for 04-30-2021 surgery, patient vocalized understanding all pre op instructions  Left message with selita at dr pace office did she receive nephrology note from dr sanford  nephrology for 04-30-2021 surgery?  Addendum: received fax from dr pace pt cleared from  nephrology dr sanford dated 04-24-2021 and placed on chart for 04-30-2021 surgery

## 2021-04-29 NOTE — H&P (Signed)
CC/HPI: cc: urinary retention   12/07/20: 55 year old man with a history of urinary retention last seen at [facility] in 2020 for the same recently presented to ED with urinary retention. He also has CKD stage 4. Imaging shows bilateral hydronephrosis with cortical thinning secondary to BPH with bladder outlet obstruction. Patient has had 5 prior episodes of urinary retention. He does not have any sensation to void with the Foley catheter in place.   02/14/21: 55 year old man with a history of urinary retention and CKD stage 4 here for follow-up. He is scheduled for prostate ultrasound and renal ultrasound today. Prostate ultrasound shows 50g prostate with calcifications noted. He did have a PSA drawn at his last visit however will repeat due to having recent Foley catheter change. Renal ultrasound today showed questionable fullness of right kidney.   03/18/21: 55 year old man with a history of urinary retention and CKD stage IV here for follow-up after urodynamic study. Urodynamic study showed high-pressure low-flow. Foley catheter was replaced. His PSA has been trending down most recently 7.29 from 10.3.   UDS SUMMARY  Daniel Mccarty a max capacity of approx. 344 mls. His 1st sensation was felt at 163 mls. No instability was noted. He was able to generate a voluntary contraction and void but it did take some time to empty his bladder. Voided volume was 85 mll with max flow of 2 ml/s. EMG leads were basically quiet during voiding. PVR was approx. 400 mls. Trabeculation and some elevation of the bladder base was noted. No reflux was noted. A 18 french coude was reinserted before the patient left the clinic.    ALLERGIES: None   MEDICATIONS: Finasteride  Tamsulosin Hcl 0.4 mg capsule  Amlodipine Besilate  Calcitriol 0.25 mcg capsule  Calcium Acetate  Zofran     GU PSH: Complex cystometrogram, w/ void pressure and urethral pressure profile studies, any technique - 03/07/2021 Complex Uroflow -  03/07/2021 Emg surf Electrd - 03/07/2021 Inject For cystogram - 03/07/2021 Intrabd voidng Press - 03/07/2021     NON-GU PSH: None   GU PMH: Urinary Retention - 03/07/2021, - 02/14/2021, - 12/07/2020, - 2020, - 2020, - 2020 BPH w/LUTS - 02/14/2021, - 12/07/2020, - 2020, - 2020, - 2020 Elevated PSA - 02/14/2021 Incomplete bladder emptying - 02/14/2021, - 12/07/2020, - 2020 Chronic kidney disease stage 3 (GFR 30-60) (Stable, Chronic) - 2020 Encounter for Prostate Cancer screening (Chronic) - 2020 Acute Cystitis/UTI - 2020    NON-GU PMH: None   FAMILY HISTORY: None   SOCIAL HISTORY: Marital Status: Single Preferred Language: English; Ethnicity: Not Hispanic Or Latino; Race: Black or African American Does not use smokeless tobacco. Has never drank.  Does not use drugs. Drinks 1 caffeinated drink per day. 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    REVIEW OF SYSTEMS:    GU Review Male:   Patient denies frequent urination, hard to postpone urination, burning/ pain with urination, get up at night to urinate, leakage of urine, stream starts and stops, trouble starting your stream, have to strain to urinate , erection problems, and penile pain.  Gastrointestinal (Upper):   Patient denies nausea, vomiting, and indigestion/ heartburn.  Gastrointestinal (Lower):   Patient denies diarrhea and constipation.  Constitutional:   Patient denies fever, night sweats, weight loss, and fatigue.  Skin:   Patient denies skin rash/ lesion and itching.  Eyes:   Patient denies blurred vision and double vision.  Ears/ Nose/ Throat:   Patient denies sore throat and sinus problems.  Hematologic/Lymphatic:  Patient denies swollen glands and easy bruising.  Cardiovascular:   Patient denies leg swelling and chest pains.  Respiratory:   Patient denies cough and shortness of breath.  Endocrine:   Patient denies excessive thirst.  Musculoskeletal:   Patient denies back pain and joint pain.  Neurological:   Patient denies  headaches and dizziness.  Psychologic:   Patient denies depression and anxiety.   VITAL SIGNS: None   MULTI-SYSTEM PHYSICAL EXAMINATION:    Constitutional: Well-nourished. No physical deformities. Normally developed. Good grooming.  Neck: Neck symmetrical, not swollen. Normal tracheal position.  Respiratory: No labored breathing, no use of accessory muscles.   Skin: No paleness, no jaundice, no cyanosis. No lesion, no ulcer, no rash.  Neurologic / Psychiatric: Oriented to time, oriented to place, oriented to person. No depression, no anxiety, no agitation.  Gastrointestinal: No rigidity, non obese abdomen.   Eyes: Normal conjunctivae. Normal eyelids.  Ears, Nose, Mouth, and Throat: Left ear no scars, no lesions, no masses. Right ear no scars, no lesions, no masses. Nose no scars, no lesions, no masses. Normal hearing. Normal lips.  Musculoskeletal: Normal gait and station of head and neck.     Complexity of Data:  Records Review:   Previous Patient Records, POC Tool  Urine Test Review:   Urinalysis  Urodynamics Review:   Review Urodynamics Tests   02/14/21 12/07/20  PSA  Total PSA 7.29 ng/mL 10.30 ng/mL  Free PSA 1.60 ng/mL   % Free PSA 22 % PSA    Notes:                     11/13/2020: BUN 66, creatinine 9.86   PROCEDURES: None   ASSESSMENT:      ICD-10 Details  1 GU:   BPH w/LUTS - N40.1 Chronic, Stable  2   Urinary Retention - R33.8 Chronic, Stable  3   Incomplete bladder emptying - R39.14 Chronic, Stable  4   Elevated PSA - R97.20 Undiagnosed New Problem   04/01/21: Here for prostate biopsy (elevated PSA)  1. BPH/urinary retention: Patient continues with indwelling Foley catheter. Urodynamic study showed high-pressure low-flow consistent with bladder outlet obstruction. We discussed TURP in detail including but not limited to bleeding, infection, retrograde ejaculation, need for continued Foley catheter following the procedure, demonstrating structures, pain, regrowth,  stricture, failure of procedure. Patient's PSA is trending down however still elevated. We will repeat PSA today.

## 2021-04-30 ENCOUNTER — Observation Stay (HOSPITAL_BASED_OUTPATIENT_CLINIC_OR_DEPARTMENT_OTHER)
Admission: RE | Admit: 2021-04-30 | Discharge: 2021-05-01 | Disposition: A | Discharge status: Home / Self Care | Payer: BC Managed Care – PPO | Attending: Urology | Admitting: Urology

## 2021-04-30 ENCOUNTER — Other Ambulatory Visit: Payer: Self-pay

## 2021-04-30 ENCOUNTER — Ambulatory Visit (HOSPITAL_BASED_OUTPATIENT_CLINIC_OR_DEPARTMENT_OTHER): Payer: BC Managed Care – PPO | Admitting: Anesthesiology

## 2021-04-30 ENCOUNTER — Encounter (HOSPITAL_BASED_OUTPATIENT_CLINIC_OR_DEPARTMENT_OTHER): Payer: Self-pay | Admitting: Urology

## 2021-04-30 ENCOUNTER — Encounter (HOSPITAL_BASED_OUTPATIENT_CLINIC_OR_DEPARTMENT_OTHER)
Admission: RE | Disposition: A | Discharge status: Home / Self Care | Payer: Self-pay | Source: Home / Self Care | Attending: Urology

## 2021-04-30 DIAGNOSIS — R3914 Feeling of incomplete bladder emptying: Secondary | ICD-10-CM | POA: Insufficient documentation

## 2021-04-30 DIAGNOSIS — R338 Other retention of urine: Secondary | ICD-10-CM | POA: Insufficient documentation

## 2021-04-30 DIAGNOSIS — N401 Enlarged prostate with lower urinary tract symptoms: Principal | ICD-10-CM | POA: Diagnosis present

## 2021-04-30 DIAGNOSIS — Z79899 Other long term (current) drug therapy: Secondary | ICD-10-CM | POA: Diagnosis not present

## 2021-04-30 DIAGNOSIS — N138 Other obstructive and reflux uropathy: Secondary | ICD-10-CM | POA: Diagnosis not present

## 2021-04-30 DIAGNOSIS — N184 Chronic kidney disease, stage 4 (severe): Secondary | ICD-10-CM | POA: Diagnosis not present

## 2021-04-30 HISTORY — PX: TRANSURETHRAL RESECTION OF PROSTATE: SHX73

## 2021-04-30 HISTORY — DX: Bladder-neck obstruction: N32.0

## 2021-04-30 HISTORY — DX: Presence of other specified devices: Z97.8

## 2021-04-30 HISTORY — DX: Essential (primary) hypertension: I10

## 2021-04-30 HISTORY — DX: End stage renal disease: N18.6

## 2021-04-30 HISTORY — DX: Anemia in chronic kidney disease: D63.1

## 2021-04-30 HISTORY — DX: Secondary hyperparathyroidism of renal origin: N25.81

## 2021-04-30 LAB — POCT I-STAT, CHEM 8
BUN: 63 mg/dL — ABNORMAL HIGH (ref 6–20)
Calcium, Ion: 1.1 mmol/L — ABNORMAL LOW (ref 1.15–1.40)
Chloride: 103 mmol/L (ref 98–111)
Creatinine, Ser: 11.4 mg/dL — ABNORMAL HIGH (ref 0.61–1.24)
Glucose, Bld: 90 mg/dL (ref 70–99)
HCT: 36 % — ABNORMAL LOW (ref 39.0–52.0)
Hemoglobin: 12.2 g/dL — ABNORMAL LOW (ref 13.0–17.0)
Potassium: 5 mmol/L (ref 3.5–5.1)
Sodium: 143 mmol/L (ref 135–145)
TCO2: 27 mmol/L (ref 22–32)

## 2021-04-30 SURGERY — TURP (TRANSURETHRAL RESECTION OF PROSTATE)
Anesthesia: General | Site: Prostate

## 2021-04-30 MED ORDER — STERILE WATER FOR IRRIGATION IR SOLN
Status: DC | PRN
  Administered 2021-04-30: 500 mL

## 2021-04-30 MED ORDER — TAMSULOSIN HCL 0.4 MG PO CAPS
ORAL_CAPSULE | ORAL | Status: AC
  Filled 2021-04-30: qty 1

## 2021-04-30 MED ORDER — MORPHINE SULFATE (PF) 4 MG/ML IV SOLN: 2.0000 mg | INTRAVENOUS | Status: DC | PRN

## 2021-04-30 MED ORDER — ONDANSETRON HCL 4 MG/2ML IJ SOLN
INTRAMUSCULAR | Status: DC | PRN
  Administered 2021-04-30: 4 mg via INTRAVENOUS

## 2021-04-30 MED ORDER — ACETAMINOPHEN 500 MG PO TABS
1000.0000 mg | ORAL_TABLET | Freq: Once | ORAL | Status: AC
  Administered 2021-04-30: 1000 mg via ORAL

## 2021-04-30 MED ORDER — ONDANSETRON HCL 4 MG/2ML IJ SOLN
INTRAMUSCULAR | Status: AC
  Filled 2021-04-30: qty 2

## 2021-04-30 MED ORDER — ONDANSETRON HCL 4 MG/2ML IJ SOLN: 4.0000 mg | INTRAMUSCULAR | Status: DC | PRN

## 2021-04-30 MED ORDER — SENNOSIDES-DOCUSATE SODIUM 8.6-50 MG PO TABS
2.0000 | ORAL_TABLET | Freq: Every day | ORAL | Status: DC
  Administered 2021-04-30: 2 via ORAL
  Filled 2021-04-30 (×2): qty 2

## 2021-04-30 MED ORDER — LIDOCAINE 2% (20 MG/ML) 5 ML SYRINGE
INTRAMUSCULAR | Status: DC | PRN
  Administered 2021-04-30: 60 mg via INTRAVENOUS

## 2021-04-30 MED ORDER — PROMETHAZINE HCL 25 MG/ML IJ SOLN
6.2500 mg | INTRAMUSCULAR | Status: DC | PRN
Start: 2021-04-30 — End: 2021-05-01

## 2021-04-30 MED ORDER — FINASTERIDE 5 MG PO TABS
5.0000 mg | ORAL_TABLET | Freq: Every day | ORAL | Status: DC
  Administered 2021-04-30: 5 mg via ORAL
  Filled 2021-04-30: qty 1

## 2021-04-30 MED ORDER — PHENYLEPHRINE 40 MCG/ML (10ML) SYRINGE FOR IV PUSH (FOR BLOOD PRESSURE SUPPORT)
PREFILLED_SYRINGE | INTRAVENOUS | Status: DC | PRN
  Administered 2021-04-30 (×3): 80 ug via INTRAVENOUS
  Administered 2021-04-30: 120 ug via INTRAVENOUS

## 2021-04-30 MED ORDER — MIDAZOLAM HCL 5 MG/5ML IJ SOLN
INTRAMUSCULAR | Status: DC | PRN
  Administered 2021-04-30: 2 mg via INTRAVENOUS

## 2021-04-30 MED ORDER — BELLADONNA ALKALOIDS-OPIUM 16.2-60 MG RE SUPP: 1.0000 | Freq: Four times a day (QID) | RECTAL | Status: DC | PRN

## 2021-04-30 MED ORDER — SODIUM CHLORIDE 0.9 % IR SOLN
Status: DC | PRN
  Administered 2021-04-30: 600 mL
  Administered 2021-04-30 (×8): 3000 mL

## 2021-04-30 MED ORDER — OXYCODONE HCL 5 MG PO TABS: 5.0000 mg | ORAL_TABLET | Freq: Once | ORAL | Status: DC | PRN

## 2021-04-30 MED ORDER — DEXAMETHASONE SODIUM PHOSPHATE 10 MG/ML IJ SOLN
INTRAMUSCULAR | Status: DC | PRN
  Administered 2021-04-30: 5 mg via INTRAVENOUS

## 2021-04-30 MED ORDER — ACETAMINOPHEN 500 MG PO TABS
ORAL_TABLET | ORAL | Status: AC
  Filled 2021-04-30: qty 2

## 2021-04-30 MED ORDER — OXYCODONE HCL 5 MG PO TABS: 5.0000 mg | ORAL_TABLET | ORAL | Status: DC | PRN

## 2021-04-30 MED ORDER — CALCITRIOL 0.25 MCG PO CAPS
0.2500 ug | ORAL_CAPSULE | Freq: Every day | ORAL | Status: DC
  Administered 2021-04-30: 0.25 ug via ORAL
  Filled 2021-04-30: qty 1

## 2021-04-30 MED ORDER — AMLODIPINE BESYLATE 5 MG PO TABS
5.0000 mg | ORAL_TABLET | Freq: Every day | ORAL | Status: DC
  Administered 2021-04-30: 5 mg via ORAL
  Filled 2021-04-30: qty 1

## 2021-04-30 MED ORDER — CALCIUM ACETATE (PHOS BINDER) 667 MG PO CAPS
1334.0000 mg | ORAL_CAPSULE | Freq: Three times a day (TID) | ORAL | Status: DC
  Administered 2021-04-30 – 2021-05-01 (×3): 1334 mg via ORAL
  Filled 2021-04-30 (×3): qty 2

## 2021-04-30 MED ORDER — ACETAMINOPHEN 325 MG PO TABS: 650.0000 mg | ORAL_TABLET | ORAL | Status: DC | PRN

## 2021-04-30 MED ORDER — DIPHENHYDRAMINE HCL 50 MG/ML IJ SOLN: 12.5000 mg | Freq: Four times a day (QID) | INTRAMUSCULAR | Status: DC | PRN

## 2021-04-30 MED ORDER — DIPHENHYDRAMINE HCL 12.5 MG/5ML PO ELIX: 12.5000 mg | ORAL_SOLUTION | Freq: Four times a day (QID) | ORAL | Status: DC | PRN

## 2021-04-30 MED ORDER — SODIUM CHLORIDE 0.9 % IV SOLN: INTRAVENOUS | Status: DC

## 2021-04-30 MED ORDER — CIPROFLOXACIN IN D5W 400 MG/200ML IV SOLN
INTRAVENOUS | Status: AC
  Filled 2021-04-30: qty 200

## 2021-04-30 MED ORDER — TRAMADOL HCL 50 MG PO TABS: 50.0000 mg | ORAL_TABLET | Freq: Four times a day (QID) | ORAL | 0 refills | Status: AC | PRN

## 2021-04-30 MED ORDER — FENTANYL CITRATE (PF) 100 MCG/2ML IJ SOLN: 25.0000 ug | INTRAMUSCULAR | Status: DC | PRN

## 2021-04-30 MED ORDER — SODIUM CHLORIDE 0.9 % IR SOLN
3000.0000 mL | Status: DC
  Administered 2021-05-01: 3000 mL

## 2021-04-30 MED ORDER — FENTANYL CITRATE (PF) 100 MCG/2ML IJ SOLN
INTRAMUSCULAR | Status: DC | PRN
  Administered 2021-04-30: 100 ug via INTRAVENOUS
  Administered 2021-04-30 (×2): 50 ug via INTRAVENOUS

## 2021-04-30 MED ORDER — CIPROFLOXACIN IN D5W 400 MG/200ML IV SOLN
400.0000 mg | INTRAVENOUS | Status: AC
  Administered 2021-04-30: 400 mg via INTRAVENOUS

## 2021-04-30 MED ORDER — TAMSULOSIN HCL 0.4 MG PO CAPS
0.4000 mg | ORAL_CAPSULE | Freq: Every day | ORAL | Status: DC
  Administered 2021-04-30: 0.4 mg via ORAL

## 2021-04-30 MED ORDER — PROPOFOL 10 MG/ML IV BOLUS
INTRAVENOUS | Status: DC | PRN
  Administered 2021-04-30: 200 mg via INTRAVENOUS

## 2021-04-30 MED ORDER — 0.9 % SODIUM CHLORIDE (POUR BTL) OPTIME
TOPICAL | Status: DC | PRN
  Administered 2021-04-30: 500 mL

## 2021-04-30 MED ORDER — OXYCODONE HCL 5 MG/5ML PO SOLN: 5.0000 mg | Freq: Once | ORAL | Status: DC | PRN

## 2021-04-30 SURGICAL SUPPLY — 21 items
BAG DRAIN URO-CYSTO SKYTR STRL (DRAIN) ×2 IMPLANT
BAG DRN RND TRDRP ANRFLXCHMBR (UROLOGICAL SUPPLIES) ×1
BAG DRN UROCATH (DRAIN) ×1
BAG URINE DRAIN 2000ML AR STRL (UROLOGICAL SUPPLIES) ×2 IMPLANT
CATH HEMA 3WAY 30CC 22FR COUDE (CATHETERS) ×2 IMPLANT
CLOTH BEACON ORANGE TIMEOUT ST (SAFETY) ×2 IMPLANT
GLOVE SURG ENC MOIS LTX SZ6.5 (GLOVE) ×2 IMPLANT
GLOVE SURG ENC MOIS LTX SZ7 (GLOVE) ×4 IMPLANT
GOWN STRL REUS W/TWL LRG LVL3 (GOWN DISPOSABLE) ×4 IMPLANT
HOLDER FOLEY CATH W/STRAP (MISCELLANEOUS) ×2 IMPLANT
IV NS IRRIG 3000ML ARTHROMATIC (IV SOLUTION) ×20 IMPLANT
KIT TURNOVER CYSTO (KITS) ×2 IMPLANT
LOOP CUT BIPOLAR 24F LRG (ELECTROSURGICAL) ×2 IMPLANT
MANIFOLD NEPTUNE II (INSTRUMENTS) ×2 IMPLANT
NS IRRIG 500ML POUR BTL (IV SOLUTION) ×2 IMPLANT
PACK CYSTO (CUSTOM PROCEDURE TRAY) ×2 IMPLANT
SYR TOOMEY IRRIG 70ML (MISCELLANEOUS) ×2
SYRINGE TOOMEY IRRIG 70ML (MISCELLANEOUS) ×1 IMPLANT
TUBE CONNECTING 12X1/4 (SUCTIONS) ×2 IMPLANT
TUBING UROLOGY SET (TUBING) ×2 IMPLANT
WATER STERILE IRR 500ML POUR (IV SOLUTION) ×2 IMPLANT

## 2021-04-30 NOTE — OR Nursing (Signed)
[place] DISCARDED

## 2021-04-30 NOTE — Anesthesia Procedure Notes (Signed)
Procedure Name: LMA Insertion Date/Time: 04/30/2021 10:43 AM Performed by: Rogers Blocker, CRNA Pre-anesthesia Checklist: Patient identified, Emergency Drugs available, Suction available and Patient being monitored Patient Re-evaluated:Patient Re-evaluated prior to induction Oxygen Delivery Method: Circle System Utilized Preoxygenation: Pre-oxygenation with 100% oxygen Induction Type: IV induction Ventilation: Mask ventilation without difficulty LMA: LMA inserted LMA Size: 5.0 Number of attempts: 1 Placement Confirmation: positive ETCO2 Tube secured with: Tape Dental Injury: Teeth and Oropharynx as per pre-operative assessment

## 2021-04-30 NOTE — Interval H&P Note (Signed)
History and Physical Interval Note:  04/30/2021 10:06 AM  Daniel Mccarty  has presented today for surgery, with the diagnosis of BPH.  The various methods of treatment have been discussed with the patient and family. After consideration of risks, benefits and other options for treatment, the patient has consented to  Procedure(s) with comments: Central (TURP) (N/A) - 90 MINS as a surgical intervention.  The patient's history has been reviewed, patient examined, no change in status, stable for surgery.  I have reviewed the patient's chart and labs.  Questions were answered to the patient's satisfaction.     [name] D Calden Dorsey

## 2021-04-30 NOTE — Anesthesia Preprocedure Evaluation (Addendum)
Anesthesia Evaluation  Patient identified by MRN, date of birth, ID band Patient awake    Reviewed: Allergy & Precautions, NPO status , Patient's Chart, lab work & pertinent test results  History of Anesthesia Complications Negative for: history of anesthetic complications  Airway Mallampati: I  TM Distance: >3 FB Neck ROM: Full    Dental  (+) Dental Advisory Given, Teeth Intact   Pulmonary neg pulmonary ROS,    Pulmonary exam normal        Cardiovascular hypertension, Pt. on medications Normal cardiovascular exam     Neuro/Psych negative neurological ROS  negative psych ROS   GI/Hepatic negative GI ROS, Neg liver ROS,   Endo/Other  negative endocrine ROS  Renal/GU ESRF and DialysisRenal disease     Musculoskeletal negative musculoskeletal ROS (+)   Abdominal   Peds  Hematology  (+) anemia ,   Anesthesia Other Findings   Reproductive/Obstetrics                            Anesthesia Physical Anesthesia Plan  ASA: 3  Anesthesia Plan: General   Post-op Pain Management: Tylenol PO (pre-op)   Induction: Intravenous  PONV Risk Score and Plan: 2 and Treatment may vary due to age or medical condition, Ondansetron, Dexamethasone and Midazolam  Airway Management Planned: LMA  Additional Equipment: None  Intra-op Plan:   Post-operative Plan: Extubation in OR  Informed Consent: I have reviewed the patients History and Physical, chart, labs and discussed the procedure including the risks, benefits and alternatives for the proposed anesthesia with the patient or authorized representative who has indicated his/her understanding and acceptance.     Dental advisory given  Plan Discussed with: CRNA and Anesthesiologist  Anesthesia Plan Comments:        Anesthesia Quick Evaluation

## 2021-04-30 NOTE — Transfer of Care (Signed)
Immediate Anesthesia Transfer of Care Note  Patient: Daniel Mccarty  Procedure(s) Performed: TRANSURETHRAL RESECTION OF THE PROSTATE (TURP) (Prostate)  Patient Location: PACU  Anesthesia Type:General  Level of Consciousness: awake, alert , oriented and patient cooperative  Airway & Oxygen Therapy: Patient Spontanous Breathing  Post-op Assessment: Report given to RN and Post -op Vital signs reviewed and stable  Post vital signs: Reviewed and stable  Last Vitals:  Vitals Value Taken Time  BP 140/75 04/30/21 1153  Temp 36.4 C 04/30/21 1152  Pulse 66 04/30/21 1156  Resp 16 04/30/21 1156  SpO2 94 % 04/30/21 1156  Vitals shown include unvalidated device data.  Last Pain:  Vitals:   04/30/21 0946  TempSrc: Oral  PainSc: 0-No pain      Patients Stated Pain Goal: 0 (83/35/82 5189)  Complications: No notable events documented.

## 2021-04-30 NOTE — Discharge Instructions (Signed)

## 2021-04-30 NOTE — Op Note (Signed)
PATIENT:  Daniel Mccarty  PRE-OPERATIVE DIAGNOSIS: BPH with outlet obstruction  POST-OPERATIVE DIAGNOSIS: Same  PROCEDURE: TURP  SURGEON:  Jacalyn Lefevre, MD  INDICATION: Daniel Mccarty is a 55 year old man with a history of BPH with bladder outlet obstruction and urinary retention.  He also has a history of CKD and is on dialysis.   ANESTHESIA:  General  EBL:  Minimal  DRAINS: 22Fr 3 way foley  Findings: Normal anterior urethra Bilateral ureteral orifices  Trilobar prostatic hypertrophy with intravesical median lobe Mild trabeculation  SPECIMEN:  Prostate chips to pathology  After informed consent the patient was brought to the major OR and placed on the table. He was administered general anesthesia and then moved to the dorsal lithotomy position. His genitalia was sterilely prepped and draped and an official timeout was then performed.  Initially the 28 French resectoscope sheath with the visual obturator was passed into the bladder and the obturator removed. I then inserted the resectoscope element with 30 lens and  performed a systematic inspection of the bladder. I noted the bladder had moderate trabeculation but was free of any tumor stones or inflammatory lesions. The ureteral orifices were noted to be of normal configuration and position. Withdrawing the scope into the prostatic urethra I noted obstructing bilobar hypertrophy with elongation of the prostatic urethra and a median lobe component.  Resection was then begun I. first resected the median lobe in the midline down to the level of the bladder neck. I then began resecting the left lobe of the prostate by resecting first from the level of the bladder neck back to the level of the Veru at the 5:00 position and then progressed in a counterclockwise direction resecting all of the adenomatous tissue of the left lobe down to the surgical capsule. Bleeding points were cauterized as they were encountered. I then turned my  attention to the right lobe of the prostate and it was resected in an identical fashion. Tissue in the area of the apex was then resected circumferentially with care being taken to maintain the resection proximal to the Veru at all times. The prostatic chips were then flushed into the bladder and the Microvasive evacuator was then used to evacuate all chips from the bladder. Reinspection of the bladder revealed the mucosa to be intact, the ureteral orifices intact as well and well away from the bladder neck and area of resection. There were no prostatic chips remaining within the bladder. The prostatic capsule was intact throughout with no perforation and there was no active bleeding noted at the end of the procedure.  The resectoscope was therefore removed and the 22 French three-way Foley catheter was then inserted and balloon was filled to 30 cc. This was placed on mild traction and the bladder was irrigated with the irrigant returning clear. The catheter was then hooked to closed system drainage and continuous irrigation and the patient was awakened and taken to recovery room in stable and satisfactory condition. He tolerated the procedure well and there were no intraoperative complications.  PLAN OF CARE: Observation overnight with anticipated discharge in the morning.  PATIENT DISPOSITION:  PACU - Hemodynamically stable.

## 2021-04-30 NOTE — Anesthesia Postprocedure Evaluation (Signed)
Anesthesia Post Note  Patient: Daniel Mccarty  Procedure(s) Performed: TRANSURETHRAL RESECTION OF THE PROSTATE (TURP) (Prostate)     Patient location during evaluation: PACU Anesthesia Type: General Level of consciousness: awake and alert Pain management: pain level controlled Vital Signs Assessment: post-procedure vital signs reviewed and stable Respiratory status: spontaneous breathing, nonlabored ventilation and respiratory function stable Cardiovascular status: stable and blood pressure returned to baseline Anesthetic complications: no   No notable events documented.  Last Vitals:  Vitals:   04/30/21 1230 04/30/21 1235  BP: 102/69 120/74  Pulse: (!) 54 (!) 53  Resp: 12 14  Temp: 36.4 C 36.5 C  SpO2: 92% 97%    Last Pain:  Vitals:   04/30/21 1235  TempSrc:   PainSc: 0-No pain                 Audry Pili

## 2021-05-01 DIAGNOSIS — N401 Enlarged prostate with lower urinary tract symptoms: Secondary | ICD-10-CM | POA: Diagnosis not present

## 2021-05-01 LAB — SURGICAL PATHOLOGY

## 2021-05-01 NOTE — Discharge Summary (Signed)
Date of admission: 04/30/2021  Date of discharge: 05/01/2021  Admission diagnosis: BPH  Discharge diagnosis: BPH  History and Physical: For full details, please see admission history and physical. Briefly, Daniel Mccarty is a 55 y.o. year old patient with BPH who underwent TURP with Dr. Claudia Desanctis on 04/30/2021.   Hospital Course: The patient recovered in the usual expected fashion.  He had his diet advanced slowly.  Initially managed with IV pain control, then transitioned to PO meds when he was tolerating oral intake.  His labs were stable throughout the hospital course.  He was discharged to home on POD#1.  At the time of discharge the patient was tolerating a regular diet, passing flatus, ambulating, had adequate pain control and was agreeable to discharge. Foley catheter remained in place with only slight pink tinge urine. Follow up as scheduled.    Laboratory values:  Recent Labs    04/30/21 1030  HGB 12.2*  HCT 36.0*   Recent Labs    04/30/21 1030  CREATININE 11.40*    Disposition: Home  Discharge instruction: The patient was instructed to be ambulatory but told to refrain from heavy lifting, strenuous activity, or driving.   Discharge medications:  Allergies as of 05/01/2021   No Known Allergies      Medication List     TAKE these medications    amLODipine 5 MG tablet Commonly known as: NORVASC Take 1 tablet (5 mg total) by mouth daily.   calcitRIOL 0.25 MCG capsule Commonly known as: ROCALTROL Take 0.25 mcg by mouth daily.   Calcium Acetate 667 MG Tabs Take 2 tablets by mouth 3 (three) times daily before meals. And take with snacks   finasteride 5 MG tablet Commonly known as: PROSCAR Take 5 mg by mouth daily.   tamsulosin 0.4 MG Caps capsule Commonly known as: FLOMAX Take 0.4 mg by mouth at bedtime.   traMADol 50 MG tablet Commonly known as: Ultram Take 1 tablet (50 mg total) by mouth every 6 (six) hours as needed.        Followup:   Follow-up  Information     [facility] Follow up.   Contact information: [facility]. [facility]  Pager: [phone]

## 2021-05-02 ENCOUNTER — Encounter (HOSPITAL_BASED_OUTPATIENT_CLINIC_OR_DEPARTMENT_OTHER): Payer: Self-pay | Admitting: Urology

## 2022-12-11 IMAGING — US US RENAL
1 series · 13 of 25 positions shown · non-contrast
Comparison: Abdominopelvic CT 08/14/2018

CLINICAL DATA: Acute kidney injury.

EXAM:
RENAL / URINARY TRACT ULTRASOUND COMPLETE

[Series 1: us renal · 13 of 47 slices shown]
[im 1/47]
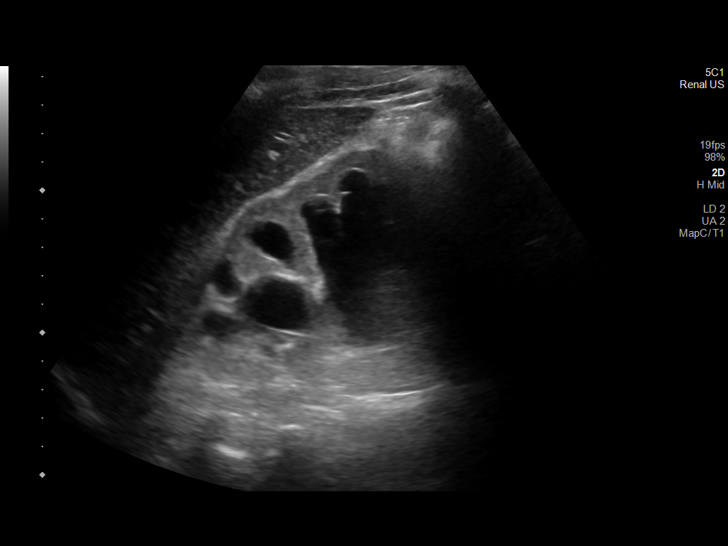
[im 4/47]
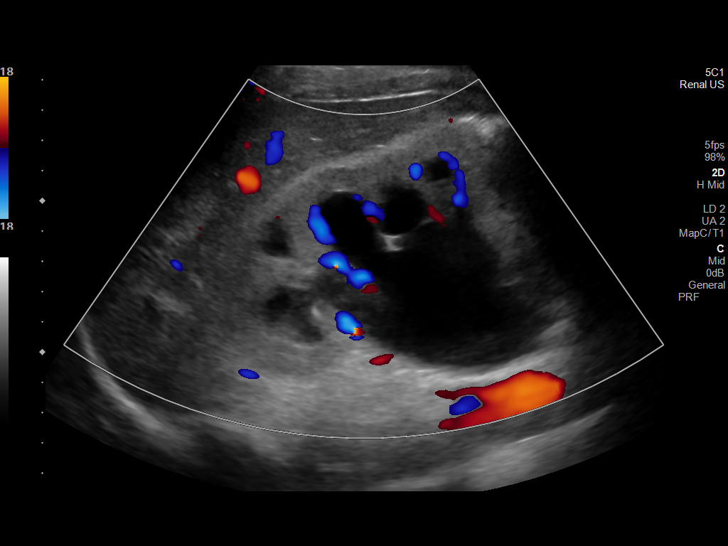
[im 8/47]
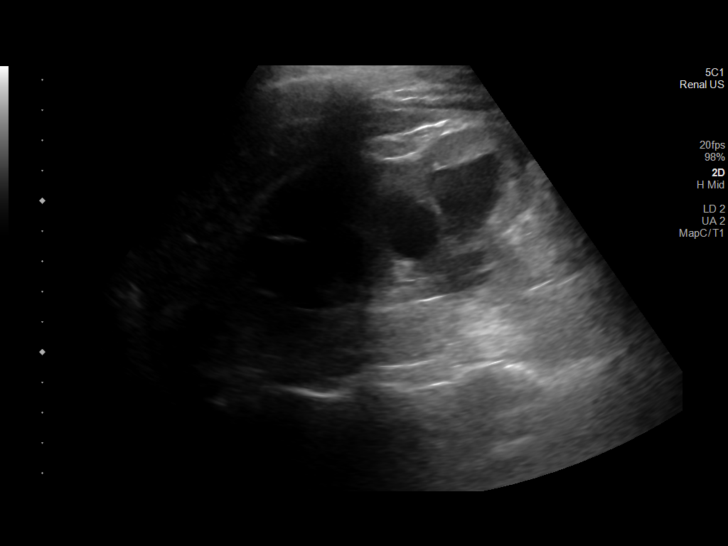
[im 12/47]
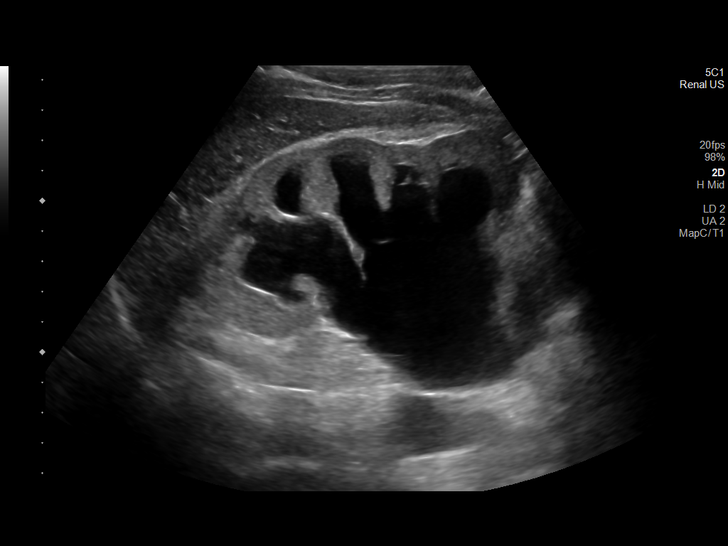
[im 16/47]
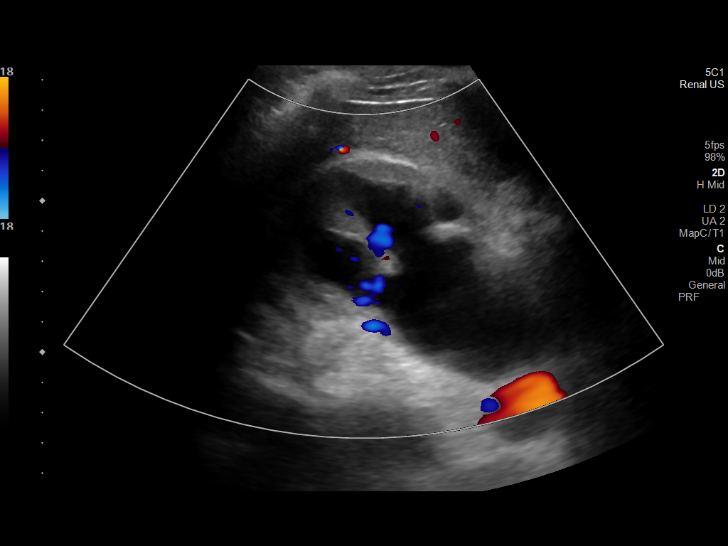
[im 20/47]
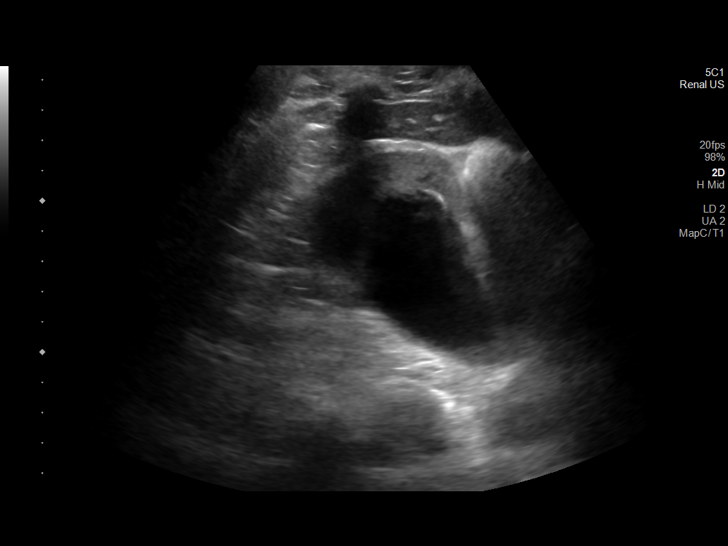
[im 24/47]
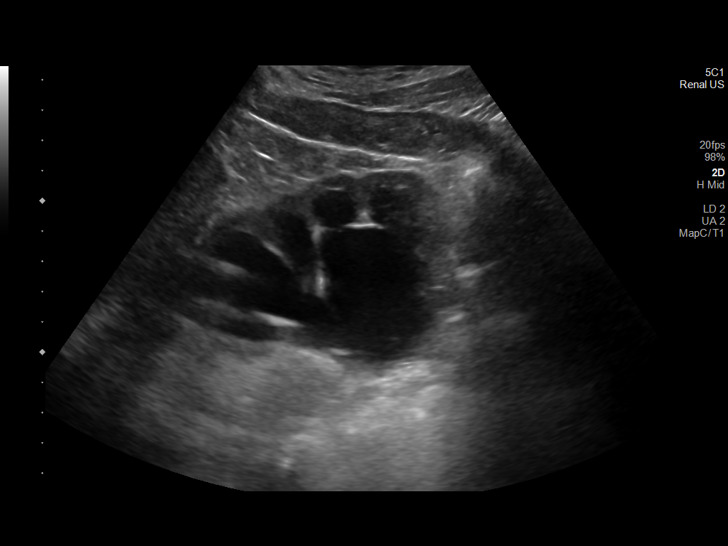
[im 27/47]
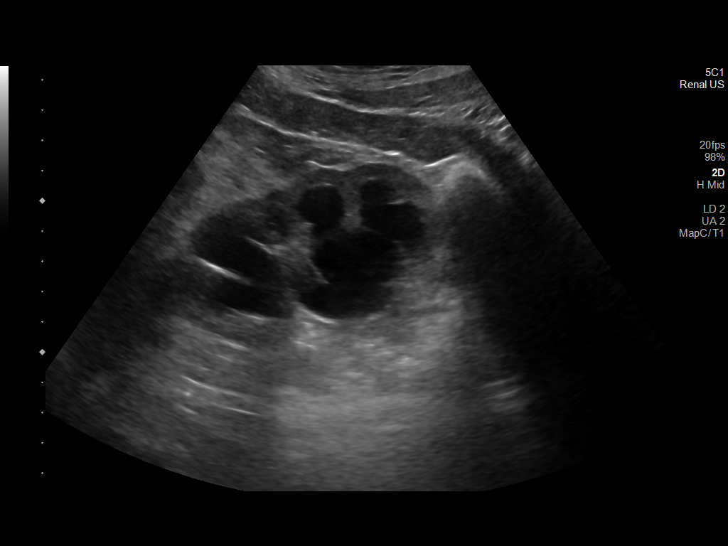
[im 31/47]
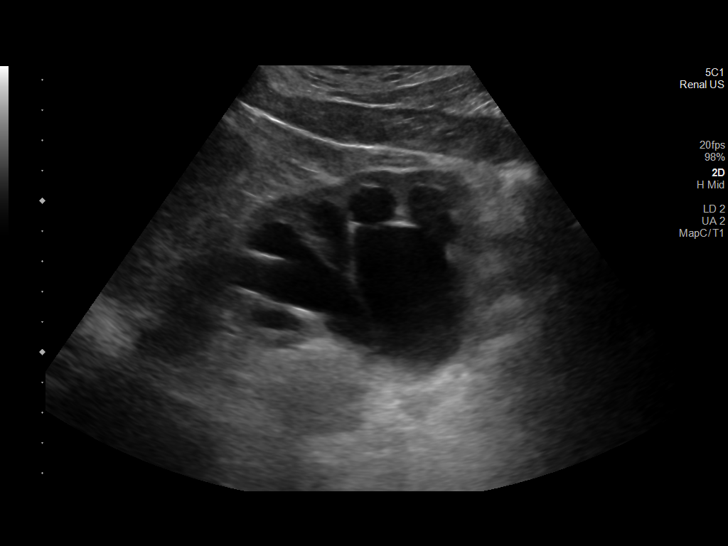
[im 35/47]
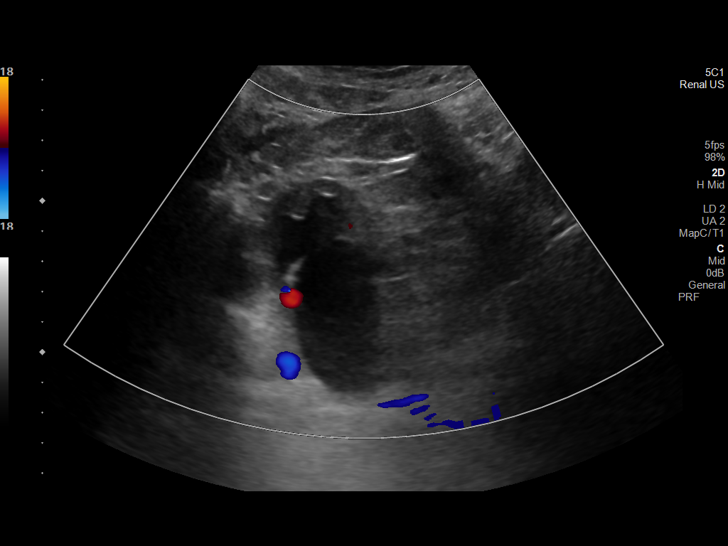
[im 39/47]
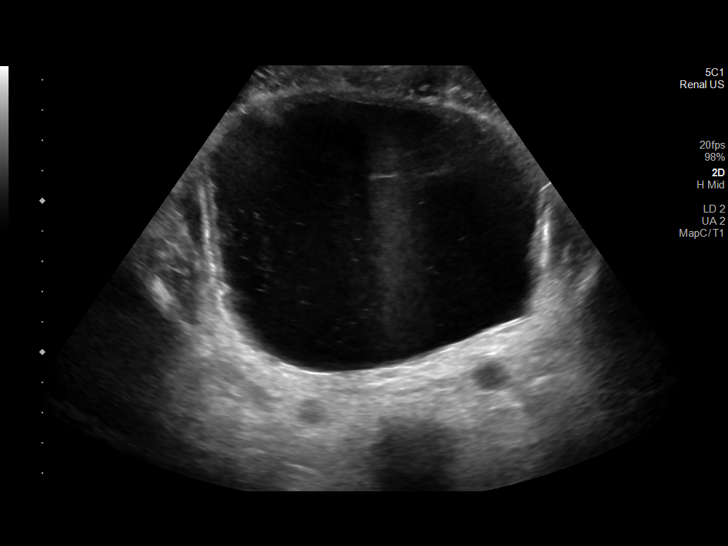
[im 43/47]
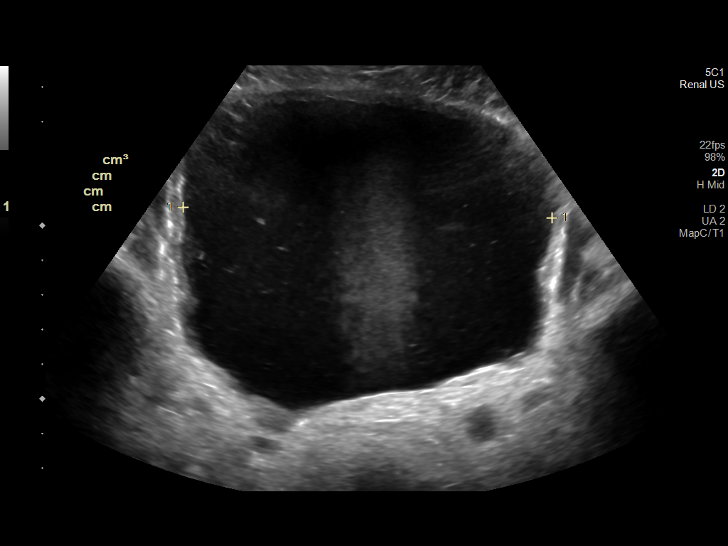
[im 47/47]
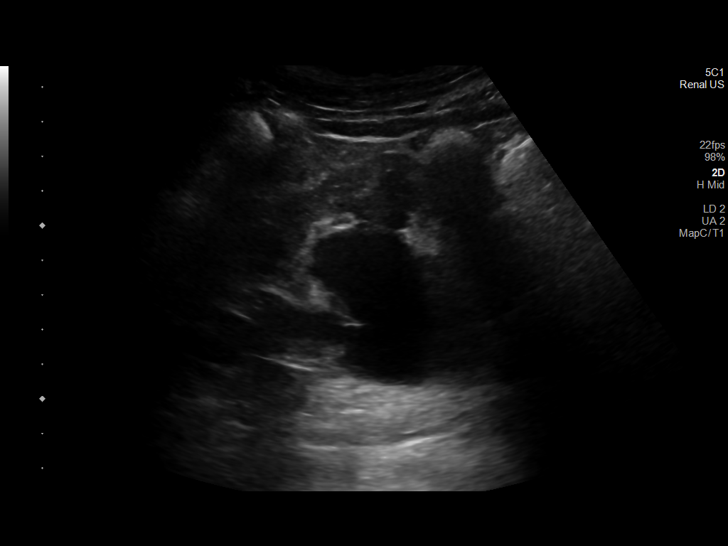

[13 of 25 positions shown; findings below may reference images not displayed]

FINDINGS: Right Kidney:

Renal measurements: 11.4 x 6.1 x 5.3 cm = volume: 194 mL. Marked
hydronephrosis. Mild thinning of the renal parenchyma with increased
parenchymal echogenicity. No evidence of stones or focal lesion.

Left Kidney:

Renal measurements: 9.3 x 5.2 x 4.2 cm = volume: 107 mL. Marked
hydronephrosis. Moderate thinning of the renal parenchyma with mild
increased echogenicity. No evidence of stones or focal lesion.

Bladder:

Distended with prevoid bladder volume of 708 cc. Internal
debris/low-level echoes. Patient had difficulty voiding, and
postvoid bladder volume actually increased, 773 cc. There is mild
diffuse bladder wall thickening.

Other:

None.
IMPRESSION: 1. Marked bilateral hydronephrosis with renal cortical thinning and
increased parenchymal echogenicity, suggesting this is chronic. The
degree of hydronephrosis has progressed from August 2018 abdominal
CT.
2. Distended urinary bladder. Postvoid bladder volume actually
increased as patient was unable to void, with postvoid bladder
volume of 773 cc.
3. Internal bladder debris which may be due to stasis or infection.
Mild bladder wall thickening.

## 2022-12-12 IMAGING — US IR FLUORO GUIDE CV LINE*R*
1 series · 1 of 1 positions shown · non-contrast
Comparison: None

CLINICAL DATA: Progressive renal insufficiency, needs access for
hemodialysis

EXAM:
TUNNELED HEMODIALYSIS CATHETER PLACEMENT WITH ULTRASOUND AND
FLUOROSCOPIC GUIDANCE
TECHNIQUE: The procedure, risks, benefits, and alternatives were explained to
the patient. Questions regarding the procedure were encouraged and
answered. The patient understands and consents to the procedure. As
antibiotic prophylaxis, cefazolin 2 g was ordered pre-procedure and
administered intravenously within one hour of incision.Patency of
the right IJ vein was confirmed with ultrasound with image
documentation. An appropriate skin site was determined. Region was
prepped using maximum barrier technique including cap and mask,
sterile gown, sterile gloves, large sterile sheet, and Chlorhexidine
as cutaneous antisepsis. The region was infiltrated locally with 1%
lidocaine.

[Series 1: ir fluoro guide cv line*right* · 1 of 1 slices shown]
[im 1/1]
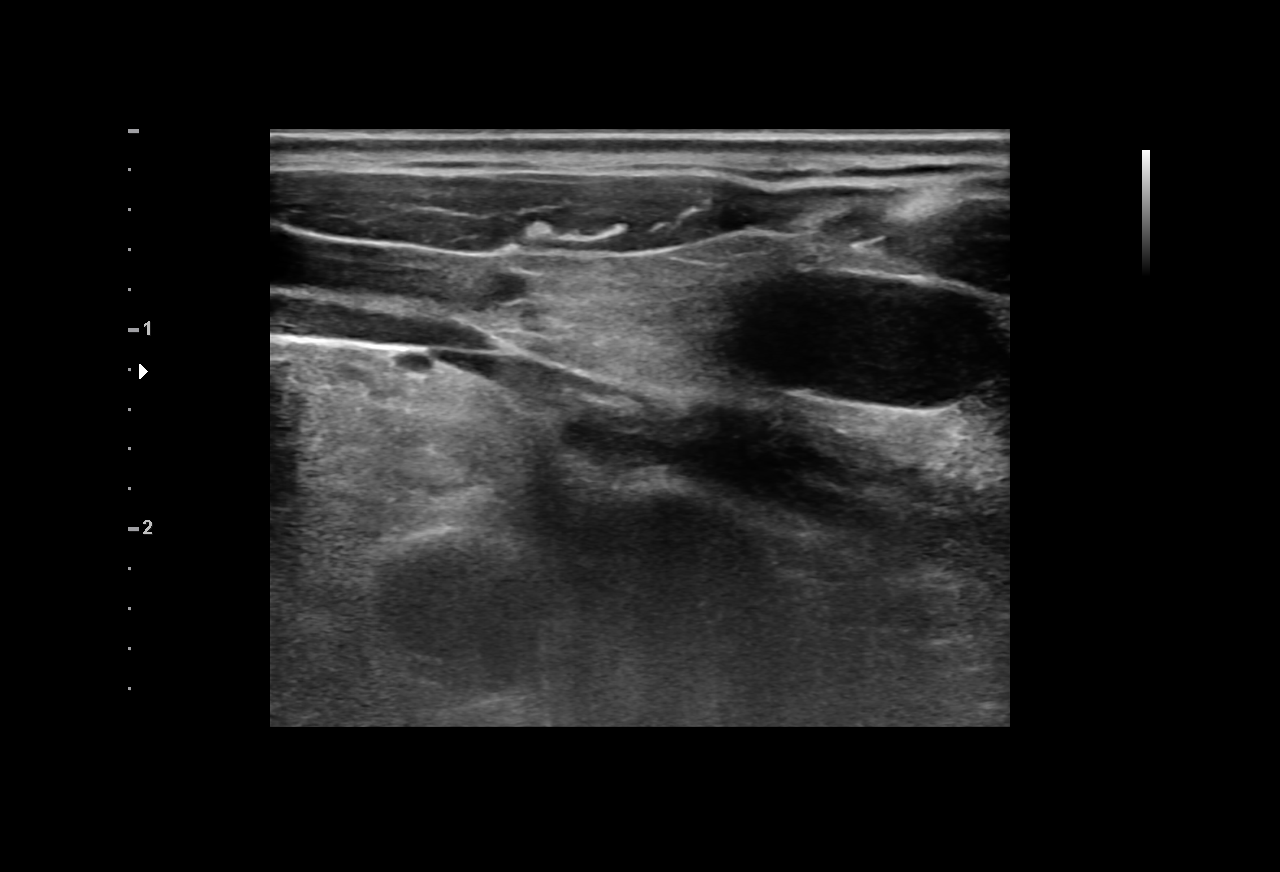

[1 of 1 positions shown; findings below may reference images not displayed]

Intravenous Fentanyl 29mcg and Versed 1mg were administered as
conscious sedation during continuous monitoring of the patient's
level of consciousness and physiological / cardiorespiratory status
by the radiology RN, with a total moderate sedation time of 15
minutes. Under real-time ultrasound guidance, the right IJ vein was
accessed with a 21 gauge micropuncture needle; the needle tip within
the vein was confirmed with ultrasound image documentation. Needle
exchanged over the 018 guidewire for transitional dilator, which
allowed advancement of a Benson wire into the IVC. Over this, an MPA
catheter was advanced. A Palindrome 23 hemodialysis catheter was
tunneled from the right anterior chest wall approach to the right IJ
dermatotomy site. The MPA catheter was exchanged over an Amplatz
wire for serial vascular dilators which allow placement of a
peel-away sheath, through which the catheter was advanced under
intermittent fluoroscopy, positioned with its tips in the proximal
and midright atrium. Spot chest radiograph confirms good catheter
position. No pneumothorax. Catheter was flushed and primed per
protocol. Catheter secured externally with O Prolene sutures. The
right IJ dermatotomy site was closed with Dermabond.

COMPLICATIONS:
COMPLICATIONS
None immediate

FLUOROSCOPY TIME:  48 seconds; 2 mGy
IMPRESSION: 1. Technically successful placement of tunneled right IJ
hemodialysis catheter with ultrasound and fluoroscopic guidance.
Ready for routine use.

## 2022-12-24 ENCOUNTER — Other Ambulatory Visit: Payer: Self-pay

## 2022-12-24 ENCOUNTER — Emergency Department (HOSPITAL_COMMUNITY)
Admission: EM | Admit: 2022-12-24 | Discharge: 2022-12-24 | Disposition: A | Discharge status: Home / Self Care | Payer: BC Managed Care – PPO | Attending: Emergency Medicine | Admitting: Emergency Medicine

## 2022-12-24 DIAGNOSIS — H6121 Impacted cerumen, right ear: Secondary | ICD-10-CM | POA: Diagnosis not present

## 2022-12-24 DIAGNOSIS — Z992 Dependence on renal dialysis: Secondary | ICD-10-CM | POA: Insufficient documentation

## 2022-12-24 DIAGNOSIS — N186 End stage renal disease: Secondary | ICD-10-CM | POA: Insufficient documentation

## 2022-12-24 DIAGNOSIS — I120 Hypertensive chronic kidney disease with stage 5 chronic kidney disease or end stage renal disease: Secondary | ICD-10-CM | POA: Insufficient documentation

## 2022-12-24 DIAGNOSIS — H9201 Otalgia, right ear: Secondary | ICD-10-CM | POA: Diagnosis present

## 2022-12-24 DIAGNOSIS — Z79899 Other long term (current) drug therapy: Secondary | ICD-10-CM | POA: Diagnosis not present

## 2022-12-24 NOTE — ED Provider Notes (Signed)
[place] EMERGENCY DEPARTMENT AT [facility] Provider Note   CSN: [id number] Arrival date & time: 12/24/22  4098     History  Chief Complaint  Patient presents with   Clogged Ear    Daniel Mccarty is a 57 y.o. male with past medical history ESRD on dialysis, anemia, hypertension who presents to the ED complaining of a clogged right ear.  States that he woke up this morning and felt like it was difficult to hear out of his right ear.  No associated ear pain, headache, vision changes, rash, fever, chills, cough, congestion, or other complaints.    Home Medications Prior to Admission medications   Medication Sig Start Date End Date Taking? Authorizing Provider  amLODipine (NORVASC) 5 MG tablet Take 1 tablet (5 mg total) by mouth daily. Patient taking differently: Take 5 mg by mouth daily. 11/13/20 04/30/21  [place], Eric J, DO  calcitRIOL (ROCALTROL) 0.25 MCG capsule Take 0.25 mcg by mouth daily.    [provider]  Calcium Acetate 667 MG TABS Take 2 tablets by mouth 3 (three) times daily before meals. And take with snacks    [provider]  finasteride (PROSCAR) 5 MG tablet Take 5 mg by mouth daily.    [provider]  tamsulosin (FLOMAX) 0.4 MG CAPS capsule Take 0.4 mg by mouth at bedtime.    [provider]      Allergies    Patient has no known allergies.    Review of Systems   Review of Systems  All other systems reviewed and are negative.   Physical Exam Updated Vital Signs BP 138/81 (BP Location: Right Arm)   Pulse 62   Temp 97.9 F (36.6 C) (Oral)   Resp 20   SpO2 99%  Physical Exam Vitals and nursing note reviewed.  Constitutional:      General: He is not in acute distress.    Appearance: Normal appearance. He is not ill-appearing or toxic-appearing.  HENT:     Head: Normocephalic and atraumatic.     Right Ear: External ear normal. There is impacted cerumen. No mastoid tenderness.     Left Ear: Tympanic  membrane, ear canal and external ear normal. No drainage. There is no impacted cerumen. No foreign body. No mastoid tenderness. No hemotympanum. Tympanic membrane is not injected, scarred, perforated, erythematous, retracted or bulging.     Mouth/Throat:     Mouth: Mucous membranes are moist.  Eyes:     Conjunctiva/sclera: Conjunctivae normal.  Cardiovascular:     Rate and Rhythm: Normal rate and regular rhythm.  Pulmonary:     Effort: Pulmonary effort is normal.     Breath sounds: Normal breath sounds.  Abdominal:     General: Abdomen is flat.     Palpations: Abdomen is soft.  Musculoskeletal:        General: Normal range of motion.     Cervical back: Normal range of motion and neck supple. No rigidity.  Skin:    General: Skin is warm and dry.     Capillary Refill: Capillary refill takes less than 2 seconds.  Neurological:     Mental Status: He is alert. Mental status is at baseline.  Psychiatric:        Mood and Affect: Mood normal.        Behavior: Behavior normal.     ED Results / Procedures / Treatments   Labs (all labs ordered are listed, but only abnormal results are displayed) Labs Reviewed -  No data to display  EKG None  Radiology No results found.  Procedures Procedures    Medications Ordered in ED Medications - No data to display  ED Course/ Medical Decision Making/ A&P                             Medical Decision Making  Medical Decision Making:   Daniel Mccarty is a 57 y.o. male who presented to the ED today with clogged ear detailed above.    Patient's presentation is complicated by their history of ESRD, HTN.  Complete initial physical exam performed, notably the patient was with right TM impacted by cerumen. Nontoxic appearing.    Reviewed and confirmed nursing documentation for past medical history, family history, social history.    Initial Assessment:   With the patient's presentation, differential diagnosis includes but is not limited to  otitis media, cerumen impaction, URI, mastoiditis, otitis externa.  This is most consistent with an acute complicated illness  Initial Plan:  Objective evaluation as below reviewed    Final Assessment and Plan:   57 year old male presents to the ED complaining of earwax buildup in the right ear.  States that he woke up this morning with difficulty hearing and pressure to his right ear.  On exam, TM is impacted by cerumen.  Left side does not appear infectious.  No recent fever.  No other complaints.  Post irrigation by nursing staff, TM is easily visualized, does not appear acutely infected, patient with relief of symptoms.  Can follow-up with primary care as needed.  Strict ED return precautions given, all questions answered, and stable for discharge.   Clinical Impression:  1. Impacted cerumen of right ear      Discharge           Final Clinical Impression(s) / ED Diagnoses Final diagnoses:  Impacted cerumen of right ear    Rx / DC Orders ED Discharge Orders     None         Tonette Lederer, PA-C 12/24/22 4098    Daniel Monday, MD 12/25/22 2155

## 2022-12-24 NOTE — ED Notes (Signed)
RN irrigated pt right ear. Wax removed and provider notified.

## 2022-12-24 NOTE — ED Notes (Signed)
Pt ambulated to restroom without incident.

## 2022-12-24 NOTE — Discharge Instructions (Addendum)
Follow up with PCP as needed. Avoid using q-tips which can push wax deeper into ear canal and cause it to become blocked. Debrox can be purchased over the counter and can help with wax buildup. For new concerns or worsening condition, return to ED for re-evaluation.

## 2022-12-24 NOTE — ED Triage Notes (Signed)
Patient reports clogged right ear this morning .

## 2023-09-02 ENCOUNTER — Inpatient Hospital Stay (HOSPITAL_COMMUNITY)
Admission: EM | Admit: 2023-09-02 | Discharge: 2023-09-05 | DRG: 280 | Disposition: A | Discharge status: Home / Self Care | Attending: Internal Medicine | Admitting: Internal Medicine

## 2023-09-02 ENCOUNTER — Other Ambulatory Visit: Payer: Self-pay

## 2023-09-02 ENCOUNTER — Emergency Department (HOSPITAL_COMMUNITY)

## 2023-09-02 DIAGNOSIS — K5900 Constipation, unspecified: Secondary | ICD-10-CM | POA: Diagnosis present

## 2023-09-02 DIAGNOSIS — E8809 Other disorders of plasma-protein metabolism, not elsewhere classified: Secondary | ICD-10-CM | POA: Diagnosis present

## 2023-09-02 DIAGNOSIS — I214 Non-ST elevation (NSTEMI) myocardial infarction: Principal | ICD-10-CM | POA: Diagnosis present

## 2023-09-02 DIAGNOSIS — N401 Enlarged prostate with lower urinary tract symptoms: Secondary | ICD-10-CM | POA: Diagnosis present

## 2023-09-02 DIAGNOSIS — N2581 Secondary hyperparathyroidism of renal origin: Secondary | ICD-10-CM | POA: Diagnosis present

## 2023-09-02 DIAGNOSIS — Z79899 Other long term (current) drug therapy: Secondary | ICD-10-CM

## 2023-09-02 DIAGNOSIS — D631 Anemia in chronic kidney disease: Secondary | ICD-10-CM | POA: Diagnosis present

## 2023-09-02 DIAGNOSIS — I120 Hypertensive chronic kidney disease with stage 5 chronic kidney disease or end stage renal disease: Secondary | ICD-10-CM | POA: Diagnosis present

## 2023-09-02 DIAGNOSIS — E663 Overweight: Secondary | ICD-10-CM | POA: Diagnosis present

## 2023-09-02 DIAGNOSIS — I10 Essential (primary) hypertension: Secondary | ICD-10-CM | POA: Diagnosis present

## 2023-09-02 DIAGNOSIS — N186 End stage renal disease: Secondary | ICD-10-CM | POA: Diagnosis present

## 2023-09-02 DIAGNOSIS — Z9079 Acquired absence of other genital organ(s): Secondary | ICD-10-CM

## 2023-09-02 DIAGNOSIS — D72829 Elevated white blood cell count, unspecified: Secondary | ICD-10-CM | POA: Diagnosis present

## 2023-09-02 DIAGNOSIS — E872 Acidosis, unspecified: Secondary | ICD-10-CM | POA: Diagnosis present

## 2023-09-02 DIAGNOSIS — Z992 Dependence on renal dialysis: Secondary | ICD-10-CM

## 2023-09-02 DIAGNOSIS — R079 Chest pain, unspecified: Secondary | ICD-10-CM | POA: Diagnosis present

## 2023-09-02 DIAGNOSIS — Z6832 Body mass index (BMI) 32.0-32.9, adult: Secondary | ICD-10-CM

## 2023-09-02 DIAGNOSIS — E871 Hypo-osmolality and hyponatremia: Secondary | ICD-10-CM | POA: Diagnosis present

## 2023-09-02 DIAGNOSIS — Z7982 Long term (current) use of aspirin: Secondary | ICD-10-CM

## 2023-09-02 LAB — BASIC METABOLIC PANEL WITH GFR
Anion gap: 17 — ABNORMAL HIGH (ref 5–15)
BUN: 62 mg/dL — ABNORMAL HIGH (ref 6–20)
CO2: 20 mmol/L — ABNORMAL LOW (ref 22–32)
Calcium: 10.4 mg/dL — ABNORMAL HIGH (ref 8.9–10.3)
Chloride: 97 mmol/L — ABNORMAL LOW (ref 98–111)
Creatinine, Ser: 13.13 mg/dL — ABNORMAL HIGH (ref 0.61–1.24)
GFR, Estimated: 4 mL/min — ABNORMAL LOW (ref >60)
Glucose, Bld: 110 mg/dL — ABNORMAL HIGH (ref 70–99)
Potassium: 4.6 mmol/L (ref 3.5–5.1)
Sodium: 134 mmol/L — ABNORMAL LOW (ref 135–145)

## 2023-09-02 LAB — CBC
HCT: 35.5 % — ABNORMAL LOW (ref 39.0–52.0)
Hemoglobin: 11.5 g/dL — ABNORMAL LOW (ref 13.0–17.0)
MCH: 30.7 pg (ref 26.0–34.0)
MCHC: 32.4 g/dL (ref 30.0–36.0)
MCV: 94.9 fL (ref 80.0–100.0)
Platelets: 311 10*3/uL (ref 150–400)
RBC: 3.74 MIL/uL — ABNORMAL LOW (ref 4.22–5.81)
RDW: 12.9 % (ref 11.5–15.5)
WBC: 12.6 10*3/uL — ABNORMAL HIGH (ref 4.0–10.5)
nRBC: 0 % (ref 0.0–0.2)

## 2023-09-02 LAB — TROPONIN I (HIGH SENSITIVITY): Troponin I (High Sensitivity): 300 ng/L (ref <18)

## 2023-09-02 MED ORDER — ASPIRIN 325 MG PO TABS: 325.0000 mg | ORAL_TABLET | Freq: Every day | ORAL | Status: DC

## 2023-09-02 MED ORDER — ONDANSETRON 4 MG PO TBDP: 4.0000 mg | ORAL_TABLET | Freq: Once | ORAL | Status: DC | PRN

## 2023-09-02 MED ORDER — NITROGLYCERIN 0.4 MG SL SUBL
0.4000 mg | SUBLINGUAL_TABLET | SUBLINGUAL | Status: DC | PRN
  Administered 2023-09-02: 0.4 mg via SUBLINGUAL
  Filled 2023-09-02: qty 1

## 2023-09-02 MED ORDER — ASPIRIN 325 MG PO TABS
325.0000 mg | ORAL_TABLET | Freq: Every day | ORAL | Status: DC
  Administered 2023-09-02: 325 mg via ORAL
  Filled 2023-09-02: qty 1

## 2023-09-02 NOTE — ED Notes (Signed)
 Phlebotomy team stuck patient 3 times, unable to collect lab. RN Kipp Brood aware

## 2023-09-02 NOTE — ED Notes (Signed)
 Pt IV flushes without resistance but will not draw blood. Lab notified for repeat Troponin blood draw.

## 2023-09-02 NOTE — ED Triage Notes (Signed)
Patient reports central chest pain this evening , no SOB , denies emesis or diaphoresis .

## 2023-09-02 NOTE — ED Notes (Signed)
 Elevated Trop result reported to Dr. Kirtland Bouchard. [place] .

## 2023-09-02 NOTE — ED Provider Notes (Signed)
  EMERGENCY DEPARTMENT AT [facility] Provider Note   CSN: [id number] Arrival date & time: 09/02/23  2010     History {Add pertinent medical, surgical, social history, OB history to HPI:1} Chief Complaint  Patient presents with   Chest Pain    Daniel Mccarty is a 58 y.o. male with past medical history of HTN, anemia of chronic disease, ESRD on dialysis presents emerged department for evaluation of chest pain that started today 1400.  Initially he endorsed the chest pain was a 9/10 and described as ache however it is improved to a 4/10.  He endorses that the chest pain worsens with exertion and has associated shortness of breath.  Of note, he does dialysis at home 4 times a week.  However, last week he only was able to do 1 dialysis   Chest Pain Associated symptoms: shortness of breath   Associated symptoms: no abdominal pain, no cough, no dizziness, no fatigue, no fever, no headache, no nausea, no numbness, no palpitations, no vomiting and no weakness        Home Medications Prior to Admission medications   Medication Sig Start Date End Date Taking? Authorizing Provider  amLODipine (NORVASC) 5 MG tablet Take 1 tablet (5 mg total) by mouth daily. Patient taking differently: Take 5 mg by mouth daily. 11/13/20 04/30/21  [place], Eric J, DO  calcitRIOL (ROCALTROL) 0.25 MCG capsule Take 0.25 mcg by mouth daily.    [provider]  Calcium Acetate 667 MG TABS Take 2 tablets by mouth 3 (three) times daily before meals. And take with snacks    [provider]  finasteride (PROSCAR) 5 MG tablet Take 5 mg by mouth daily.    [provider]  tamsulosin (FLOMAX) 0.4 MG CAPS capsule Take 0.4 mg by mouth at bedtime.    [provider]      Allergies    Patient has no known allergies.    Review of Systems   Review of Systems  Constitutional:  Negative for chills, fatigue and fever.  Respiratory:  Positive for shortness of breath.  Negative for cough, chest tightness and wheezing.   Cardiovascular:  Positive for chest pain. Negative for palpitations.  Gastrointestinal:  Negative for abdominal pain, constipation, diarrhea, nausea and vomiting.  Neurological:  Negative for dizziness, seizures, weakness, light-headedness, numbness and headaches.    Physical Exam Updated Vital Signs BP (!) 165/94   Pulse 93   Temp 98.4 F (36.9 C)   Resp (!) 25   SpO2 98%  Physical Exam  ED Results / Procedures / Treatments   Labs (all labs ordered are listed, but only abnormal results are displayed) Labs Reviewed  BASIC METABOLIC PANEL WITH GFR - Abnormal; Notable for the following components:      Result Value   Sodium 134 (*)    Chloride 97 (*)    CO2 20 (*)    Glucose, Bld 110 (*)    BUN 62 (*)    Creatinine, Ser 13.13 (*)    Calcium 10.4 (*)    GFR, Estimated 4 (*)    Anion gap 17 (*)    All other components within normal limits  CBC - Abnormal; Notable for the following components:   WBC 12.6 (*)    RBC 3.74 (*)    Hemoglobin 11.5 (*)    HCT 35.5 (*)    All other components within normal limits  TROPONIN I (HIGH SENSITIVITY) - Abnormal; Notable for the following components:  Troponin I (High Sensitivity) 300 (*)    All other components within normal limits  TROPONIN I (HIGH SENSITIVITY)    EKG None  Radiology DG Chest 2 View Result Date: 09/02/2023 CLINICAL DATA:  Chest pain EXAM: CHEST - 2 VIEW COMPARISON:  11/01/2020 FINDINGS: The heart size and mediastinal contours are within normal limits. Both lungs are clear. The visualized skeletal structures are unremarkable. IMPRESSION: No active cardiopulmonary disease. Electronically Signed   By: Sharlet Salina M.D.   On: 09/02/2023 21:23    Procedures Procedures  {Document cardiac monitor, telemetry assessment procedure when appropriate:1}  Medications Ordered in ED Medications - No data to display  ED Course/ Medical Decision Making/ A&P   {   Click  here for ABCD2, HEART and other calculatorsREFRESH Note before signing :1}                              Medical Decision Making    Patient presents to the ED for concern of ***, this involves an extensive number of treatment options, and is a complaint that carries with it a high risk of complications and morbidity.  The differential diagnosis includes ***   Co morbidities that complicate the patient evaluation  ***   Additional history obtained:  Additional history obtained from *** {Blank multiple:19196::"EMS","Family","Nursing","Outside Medical Records","Past Admission"}   External records from outside source obtained and reviewed including ***   Lab Tests:  I Ordered, and personally interpreted labs.  The pertinent results include:  ***   Imaging Studies ordered:  I ordered imaging studies including ***  I independently visualized and interpreted imaging which showed *** I agree with the radiologist interpretation   Cardiac Monitoring:  The patient was maintained on a cardiac monitor.  I personally viewed and interpreted the cardiac monitored which showed an underlying rhythm of: ***   Medicines ordered and prescription drug management:  I ordered medication including ***  for ***  Reevaluation of the patient after these medicines showed that the patient {resolved/improved/worsened:23923::"improved"} I have reviewed the patients home medicines and have made adjustments as needed   Test Considered:  ***   Critical Interventions:  ***   Consultations Obtained:  I requested consultation with the ***,  and discussed lab and imaging findings as well as pertinent plan - they recommend: ***   Problem List / ED Course:  CP    Reevaluation:  After the interventions noted above, I reevaluated the patient and found that they have :{resolved/improved/worsened:23923::"improved"}   Social Determinants of Health:  ***   Dispostion:  After  consideration of the diagnostic results and the patients response to treatment, I feel that the patent would benefit from ***.    {Document critical care time when appropriate:1} {Document review of labs and clinical decision tools ie heart score, Chads2Vasc2 etc:1}  {Document your independent review of radiology images, and any outside records:1} {Document your discussion with family members, caretakers, and with consultants:1} {Document social determinants of health affecting pt's care:1} {Document your decision making why or why not admission, treatments were needed:1} Final Clinical Impression(s) / ED Diagnoses Final diagnoses:  None    Rx / DC Orders ED Discharge Orders     None

## 2023-09-03 ENCOUNTER — Encounter (HOSPITAL_COMMUNITY): Payer: Self-pay | Admitting: Internal Medicine

## 2023-09-03 ENCOUNTER — Inpatient Hospital Stay (HOSPITAL_COMMUNITY)

## 2023-09-03 DIAGNOSIS — N401 Enlarged prostate with lower urinary tract symptoms: Secondary | ICD-10-CM | POA: Diagnosis present

## 2023-09-03 DIAGNOSIS — Z992 Dependence on renal dialysis: Secondary | ICD-10-CM | POA: Diagnosis not present

## 2023-09-03 DIAGNOSIS — I214 Non-ST elevation (NSTEMI) myocardial infarction: Secondary | ICD-10-CM | POA: Diagnosis present

## 2023-09-03 DIAGNOSIS — N2581 Secondary hyperparathyroidism of renal origin: Secondary | ICD-10-CM | POA: Diagnosis present

## 2023-09-03 DIAGNOSIS — E663 Overweight: Secondary | ICD-10-CM | POA: Diagnosis present

## 2023-09-03 DIAGNOSIS — E8809 Other disorders of plasma-protein metabolism, not elsewhere classified: Secondary | ICD-10-CM | POA: Diagnosis present

## 2023-09-03 DIAGNOSIS — N186 End stage renal disease: Secondary | ICD-10-CM

## 2023-09-03 DIAGNOSIS — E871 Hypo-osmolality and hyponatremia: Secondary | ICD-10-CM | POA: Diagnosis present

## 2023-09-03 DIAGNOSIS — R079 Chest pain, unspecified: Secondary | ICD-10-CM | POA: Diagnosis present

## 2023-09-03 DIAGNOSIS — I10 Essential (primary) hypertension: Secondary | ICD-10-CM

## 2023-09-03 DIAGNOSIS — Z79899 Other long term (current) drug therapy: Secondary | ICD-10-CM | POA: Diagnosis not present

## 2023-09-03 DIAGNOSIS — I120 Hypertensive chronic kidney disease with stage 5 chronic kidney disease or end stage renal disease: Secondary | ICD-10-CM | POA: Diagnosis present

## 2023-09-03 DIAGNOSIS — D72829 Elevated white blood cell count, unspecified: Secondary | ICD-10-CM | POA: Diagnosis present

## 2023-09-03 DIAGNOSIS — D631 Anemia in chronic kidney disease: Secondary | ICD-10-CM | POA: Diagnosis present

## 2023-09-03 DIAGNOSIS — Z7982 Long term (current) use of aspirin: Secondary | ICD-10-CM | POA: Diagnosis not present

## 2023-09-03 DIAGNOSIS — E872 Acidosis, unspecified: Secondary | ICD-10-CM | POA: Diagnosis present

## 2023-09-03 DIAGNOSIS — Z6832 Body mass index (BMI) 32.0-32.9, adult: Secondary | ICD-10-CM | POA: Diagnosis not present

## 2023-09-03 DIAGNOSIS — K5900 Constipation, unspecified: Secondary | ICD-10-CM | POA: Diagnosis present

## 2023-09-03 DIAGNOSIS — Z9079 Acquired absence of other genital organ(s): Secondary | ICD-10-CM | POA: Diagnosis not present

## 2023-09-03 LAB — CBC
HCT: 31.3 % — ABNORMAL LOW (ref 39.0–52.0)
Hemoglobin: 10.4 g/dL — ABNORMAL LOW (ref 13.0–17.0)
MCH: 31.1 pg (ref 26.0–34.0)
MCHC: 33.2 g/dL (ref 30.0–36.0)
MCV: 93.7 fL (ref 80.0–100.0)
Platelets: 265 10*3/uL (ref 150–400)
RBC: 3.34 MIL/uL — ABNORMAL LOW (ref 4.22–5.81)
RDW: 12.9 % (ref 11.5–15.5)
WBC: 8.5 10*3/uL (ref 4.0–10.5)
nRBC: 0 % (ref 0.0–0.2)

## 2023-09-03 LAB — LIPID PANEL
Cholesterol: 129 mg/dL (ref 0–200)
HDL: 39 mg/dL — ABNORMAL LOW (ref >40)
LDL Cholesterol: 79 mg/dL (ref 0–99)
Total CHOL/HDL Ratio: 3.3 ratio
Triglycerides: 56 mg/dL (ref <150)
VLDL: 11 mg/dL (ref 0–40)

## 2023-09-03 LAB — COMPREHENSIVE METABOLIC PANEL WITH GFR
ALT: 12 U/L (ref 0–44)
AST: 13 U/L — ABNORMAL LOW (ref 15–41)
Albumin: 3.4 g/dL — ABNORMAL LOW (ref 3.5–5.0)
Alkaline Phosphatase: 44 U/L (ref 38–126)
Anion gap: 14 (ref 5–15)
BUN: 65 mg/dL — ABNORMAL HIGH (ref 6–20)
CO2: 20 mmol/L — ABNORMAL LOW (ref 22–32)
Calcium: 9.7 mg/dL (ref 8.9–10.3)
Chloride: 100 mmol/L (ref 98–111)
Creatinine, Ser: 13.65 mg/dL — ABNORMAL HIGH (ref 0.61–1.24)
GFR, Estimated: 4 mL/min — ABNORMAL LOW (ref >60)
Glucose, Bld: 95 mg/dL (ref 70–99)
Potassium: 4.2 mmol/L (ref 3.5–5.1)
Sodium: 134 mmol/L — ABNORMAL LOW (ref 135–145)
Total Bilirubin: 0.7 mg/dL (ref 0.0–1.2)
Total Protein: 7.4 g/dL (ref 6.5–8.1)

## 2023-09-03 LAB — HEMOGLOBIN A1C
Hgb A1c MFr Bld: 4.8 % (ref 4.8–5.6)
Mean Plasma Glucose: 91.06 mg/dL

## 2023-09-03 LAB — ECHOCARDIOGRAM COMPLETE
AR max vel: 2.34 cm2
AV Area VTI: 2.46 cm2
AV Area mean vel: 2.36 cm2
AV Mean grad: 6 mmHg
AV Peak grad: 10.8 mmHg
Ao pk vel: 1.64 m/s
Area-P 1/2: 3.08 cm2
MV VTI: 2.15 cm2
S' Lateral: 3.1 cm

## 2023-09-03 LAB — HEPATITIS B SURFACE ANTIGEN: Hepatitis B Surface Ag: NONREACTIVE

## 2023-09-03 LAB — BRAIN NATRIURETIC PEPTIDE: B Natriuretic Peptide: 87.8 pg/mL (ref 0.0–100.0)

## 2023-09-03 LAB — PHOSPHORUS: Phosphorus: 7.5 mg/dL — ABNORMAL HIGH (ref 2.5–4.6)

## 2023-09-03 LAB — MAGNESIUM: Magnesium: 2.1 mg/dL (ref 1.7–2.4)

## 2023-09-03 LAB — TROPONIN I (HIGH SENSITIVITY)
Troponin I (High Sensitivity): 381 ng/L (ref <18)
Troponin I (High Sensitivity): 618 ng/L (ref <18)

## 2023-09-03 LAB — HEPARIN LEVEL (UNFRACTIONATED): Heparin Unfractionated: <0.1 [IU]/mL — ABNORMAL LOW (ref 0.30–0.70)

## 2023-09-03 MED ORDER — LIDOCAINE HCL (PF) 1 % IJ SOLN: 5.0000 mL | INTRAMUSCULAR | Status: DC | PRN

## 2023-09-03 MED ORDER — HEPARIN (PORCINE) 25000 UT/250ML-% IV SOLN
INTRAVENOUS | Status: AC
  Administered 2023-09-03: 1000 [IU]/h via INTRAVENOUS
  Filled 2023-09-03: qty 250

## 2023-09-03 MED ORDER — ATORVASTATIN CALCIUM 40 MG PO TABS: 40.0000 mg | ORAL_TABLET | Freq: Every day | ORAL | Status: DC

## 2023-09-03 MED ORDER — LIDOCAINE-PRILOCAINE 2.5-2.5 % EX CREA: 1.0000 | TOPICAL_CREAM | CUTANEOUS | Status: DC | PRN

## 2023-09-03 MED ORDER — ATORVASTATIN CALCIUM 40 MG PO TABS
40.0000 mg | ORAL_TABLET | Freq: Once | ORAL | Status: AC
  Administered 2023-09-03: 40 mg via ORAL
  Filled 2023-09-03: qty 1

## 2023-09-03 MED ORDER — ASPIRIN 81 MG PO TBEC
81.0000 mg | DELAYED_RELEASE_TABLET | Freq: Every day | ORAL | Status: DC
  Administered 2023-09-03 – 2023-09-05 (×3): 81 mg via ORAL
  Filled 2023-09-03 (×3): qty 1

## 2023-09-03 MED ORDER — HEPARIN (PORCINE) 25000 UT/250ML-% IV SOLN: 1000.0000 [IU]/h | INTRAVENOUS | Status: DC

## 2023-09-03 MED ORDER — HEPARIN BOLUS VIA INFUSION
4000.0000 [IU] | Freq: Once | INTRAVENOUS | Status: AC
  Administered 2023-09-03: 4000 [IU] via INTRAVENOUS
  Filled 2023-09-03: qty 4000

## 2023-09-03 MED ORDER — HEPARIN SODIUM (PORCINE) 1000 UNIT/ML DIALYSIS: 1000.0000 [IU] | INTRAMUSCULAR | Status: DC | PRN

## 2023-09-03 MED ORDER — ACETAMINOPHEN 325 MG PO TABS: 650.0000 mg | ORAL_TABLET | Freq: Four times a day (QID) | ORAL | Status: DC | PRN

## 2023-09-03 MED ORDER — ONDANSETRON HCL 4 MG/2ML IJ SOLN: 4.0000 mg | Freq: Four times a day (QID) | INTRAMUSCULAR | Status: DC | PRN

## 2023-09-03 MED ORDER — METOPROLOL TARTRATE 12.5 MG HALF TABLET
12.5000 mg | ORAL_TABLET | Freq: Two times a day (BID) | ORAL | Status: DC
  Administered 2023-09-03: 12.5 mg via ORAL
  Filled 2023-09-03: qty 1

## 2023-09-03 MED ORDER — NEPRO/CARBSTEADY PO LIQD: 237.0000 mL | ORAL | Status: DC | PRN

## 2023-09-03 MED ORDER — ANTICOAGULANT SODIUM CITRATE 4% (200MG/5ML) IV SOLN: 5.0000 mL | Status: DC | PRN

## 2023-09-03 MED ORDER — HEPARIN SODIUM (PORCINE) 1000 UNIT/ML DIALYSIS: 1500.0000 [IU] | INTRAMUSCULAR | Status: DC | PRN

## 2023-09-03 MED ORDER — ATORVASTATIN CALCIUM 40 MG PO TABS
40.0000 mg | ORAL_TABLET | Freq: Every day | ORAL | Status: DC
  Administered 2023-09-04 – 2023-09-05 (×2): 40 mg via ORAL
  Filled 2023-09-03 (×2): qty 1

## 2023-09-03 MED ORDER — PENTAFLUOROPROP-TETRAFLUOROETH EX AERO: 1.0000 | INHALATION_SPRAY | CUTANEOUS | Status: DC | PRN

## 2023-09-03 MED ORDER — ALTEPLASE 2 MG IJ SOLR: 2.0000 mg | Freq: Once | INTRAMUSCULAR | Status: DC | PRN

## 2023-09-03 MED ORDER — HEPARIN BOLUS VIA INFUSION
2000.0000 [IU] | Freq: Once | INTRAVENOUS | Status: AC
Start: 2023-09-03 — End: 2023-09-03
  Administered 2023-09-03: 2000 [IU] via INTRAVENOUS
  Filled 2023-09-03: qty 2000

## 2023-09-03 MED ORDER — ACETAMINOPHEN 650 MG RE SUPP: 650.0000 mg | Freq: Four times a day (QID) | RECTAL | Status: DC | PRN

## 2023-09-03 MED ORDER — HEPARIN SODIUM (PORCINE) 1000 UNIT/ML DIALYSIS
3000.0000 [IU] | Freq: Once | INTRAMUSCULAR | Status: DC
  Filled 2023-09-03: qty 3

## 2023-09-03 MED ORDER — HEPARIN (PORCINE) 25000 UT/250ML-% IV SOLN
1600.0000 [IU]/h | INTRAVENOUS | Status: DC
  Administered 2023-09-03: 1200 [IU]/h via INTRAVENOUS
  Filled 2023-09-03: qty 250

## 2023-09-03 MED ORDER — MELATONIN 3 MG PO TABS: 3.0000 mg | ORAL_TABLET | Freq: Every evening | ORAL | Status: DC | PRN

## 2023-09-03 MED ORDER — PERFLUTREN LIPID MICROSPHERE
1.0000 mL | INTRAVENOUS | Status: AC | PRN
  Administered 2023-09-03: 3 mL via INTRAVENOUS

## 2023-09-03 MED ORDER — CHLORHEXIDINE GLUCONATE CLOTH 2 % EX PADS
6.0000 | MEDICATED_PAD | Freq: Every day | CUTANEOUS | Status: DC
  Administered 2023-09-04 – 2023-09-05 (×2): 6 via TOPICAL

## 2023-09-03 NOTE — Consult Note (Signed)
 Cardiology Consultation   Patient ID: Daniel Mccarty MRN: [id number]; DOB: 10-15-1965  Admit date: 09/02/2023 Date of Consult: 09/03/2023  PCP:  Patient, No Pcp Per   [facility] Providers Cardiologist:  None        Patient Profile:   Daniel Mccarty is a 57 y.o. male with a hx of HTN and ESRD on HD via LUE AV fistula who is being seen 09/03/2023 for the evaluation of NSTEMI at the request of the emergency department.  History of Present Illness:   Daniel Mccarty presents with chief concern of non-radiating, substernal chest pressure with onset time of 14:00 on 09/02/23.  This was around the time he was eating lunch.  Pressure fluctuated throughout the day, but ultimately progressed which prompted him to seek care in the ER.  He denies any associated nausea/vomiting, SOB, orthopnea, PND, LEE.  Most recent dialysis session was yesterday.  Has not missed sessions.  Does not feel volume overloaded.  Denies any cardiac history.  No prior heart catheterization.  No family history of coronary disease.  Denies any history of tobacco or recreational drug use.   Hemodynamically stable in the ER.  Chest pain has improved significantly since administration of aspirin.  Labwork demonstrated a troponin level of 300 and uptrended to 381.  Given a bolus of heparin.  Cardiology consulted for further recommendations on evaluation and management.    Past Medical History:  Diagnosis Date   Anemia due to chronic kidney disease    Bladder outlet obstruction    BPH (benign prostatic hyperplasia)    ESRD (end stage renal disease) on dialysis (HCC)    due to hypertension;   nephrologist--- dr Marisue Humble,  home hemodialysis on M/ T/ Th/ F thru [facility] in [place] on [address]   Foley catheter in place    Hypertension    S/P arteriovenous (AV) fistula creation 11/09/2020   Secondary hyperparathyroidism of renal origin [facility])     Past Surgical History:  Procedure Laterality Date   AV FISTULA  PLACEMENT Left 11/09/2020   Procedure: CREATION OF  LEFT ARM  RADIO- CEPHALIC  ARTERIOVENOUS (AV) FISTULA;  Surgeon: Chuck Hint, MD;  Location: [facility] OR;  Service: Vascular;  Laterality: Left;   IR FLUORO GUIDE CV LINE RIGHT  11/02/2020   IR US GUIDE VASC ACCESS RIGHT  11/02/2020   TRANSURETHRAL RESECTION OF PROSTATE N/A 04/30/2021   Procedure: TRANSURETHRAL RESECTION OF THE PROSTATE (TURP);  Surgeon: Noel Christmas, MD;  Location: [facility];  Service: Urology;  Laterality: N/A;  90 MINS     Home Medications:  Prior to Admission medications   Medication Sig Start Date End Date Taking? Authorizing Provider  amLODipine (NORVASC) 5 MG tablet Take 1 tablet (5 mg total) by mouth daily. Patient taking differently: Take 5 mg by mouth daily. 11/13/20 04/30/21  [place], Eric J, DO  calcitRIOL (ROCALTROL) 0.25 MCG capsule Take 0.25 mcg by mouth daily.    [provider]  Calcium Acetate 667 MG TABS Take 2 tablets by mouth 3 (three) times daily before meals. And take with snacks    [provider]  finasteride (PROSCAR) 5 MG tablet Take 5 mg by mouth daily.    [provider]  tamsulosin (FLOMAX) 0.4 MG CAPS capsule Take 0.4 mg by mouth at bedtime.    [provider]    Inpatient Medications: Scheduled Meds:  aspirin  325 mg Oral Daily   Continuous Infusions:  heparin 1,000 Units/hr (  09/03/23 0208)   PRN Meds: acetaminophen **OR** acetaminophen, melatonin, nitroGLYCERIN, ondansetron (ZOFRAN) IV  Allergies:   No Known Allergies  Social History:   Social History   Socioeconomic History   Marital status: Single    Spouse name: Not on file   Number of children: Not on file   Years of education: Not on file   Highest education level: Not on file  Occupational History   Not on file  Tobacco Use   Smoking status: Never   Smokeless tobacco: Never  Vaping Use   Vaping status: Never Used  Substance and Sexual Activity   Alcohol  use: Never   Drug use: Never   Sexual activity: Not on file  Other Topics Concern   Not on file  Social History Narrative   Not on file   Social Drivers of Health   Financial Resource Strain: Not on file  Food Insecurity: Not on file  Transportation Needs: Not on file  Physical Activity: Not on file  Stress: Not on file  Social Connections: Not on file  Intimate Partner Violence: Not on file    Family History:   History reviewed. No pertinent family history.  Denies any family history of coronary artery disease.  ROS:  Please see the history of present illness.   All other ROS reviewed and negative.     Physical Exam/Data:   Vitals:   09/03/23 0100 09/03/23 0130 09/03/23 0200 09/03/23 0230  BP: (!) 160/97 (!) 157/93 (!) 154/91 (!) 136/93  Pulse: 92 87 87 85  Resp: (!) 21 18 (!) 23 20  Temp:    99.4 F (37.4 C)  TempSrc:    Oral  SpO2: 95% 96% 94% 95%   No intake or output data in the 24 hours ending 09/03/23 0259    04/30/2021    9:46 AM 04/23/2021   11:26 AM 12/19/2020   11:55 AM  Last 3 Weights  Weight (lbs) 224 lb 12.8 oz 212 lb 192 lb  Weight (kg) 101.969 kg 96.163 kg 87.091 kg     There is no height or weight on file to calculate BMI.  General:  Well nourished, well developed, in no acute distress HEENT: normal Neck: no JVD Vascular: No carotid bruits; Distal pulses 2+ bilaterally Cardiac:  normal S1, S2; NRRR; no murmur  Lungs:  clear to auscultation bilaterally, no wheezing, rhonchi or rales  Abd: soft, nontender, no hepatomegaly  Ext: no edema Musculoskeletal:  No deformities, BUE and BLE strength normal and equal Skin: warm and dry  Neuro:  CNs 2-12 intact, no focal abnormalities noted Psych:  Normal affect   EKG:  The EKG was personally reviewed and demonstrates:  NSR, LAD, LAE, LVH Telemetry:  Telemetry was personally reviewed and demonstrates:  NSR  Relevant CV Studies: No prior Echo   Laboratory Data:  High Sensitivity Troponin:    Recent Labs  Lab 09/02/23 2039 09/02/23 2310  TROPONINIHS 300* 381*     Chemistry Recent Labs  Lab 09/02/23 2039  NA 134*  K 4.6  CL 97*  CO2 20*  GLUCOSE 110*  BUN 62*  CREATININE 13.13*  CALCIUM 10.4*  GFRNONAA 4*  ANIONGAP 17*    No results for input(s): "PROT", "ALBUMIN", "AST", "ALT", "ALKPHOS", "BILITOT" in the last 168 hours. Lipids No results for input(s): "CHOL", "TRIG", "HDL", "LABVLDL", "LDLCALC", "CHOLHDL" in the last 168 hours.  Hematology Recent Labs  Lab 09/02/23 2039  WBC 12.6*  RBC 3.74*  HGB 11.5*  HCT 35.5*  MCV 94.9  MCH 30.7  MCHC 32.4  RDW 12.9  PLT 311   Thyroid No results for input(s): "TSH", "FREET4" in the last 168 hours.  BNPNo results for input(s): "BNP", "PROBNP" in the last 168 hours.  DDimer No results for input(s): "DDIMER" in the last 168 hours.   Radiology/Studies:  DG Chest 2 View Result Date: 09/02/2023 CLINICAL DATA:  Chest pain EXAM: CHEST - 2 VIEW COMPARISON:  11/01/2020 FINDINGS: The heart size and mediastinal contours are within normal limits. Both lungs are clear. The visualized skeletal structures are unremarkable. IMPRESSION: No active cardiopulmonary disease. Electronically Signed   By: Sharlet Salina M.D.   On: 09/02/2023 21:23     Assessment and Plan:  Daniel Mccarty is a 58 y.o. male with a hx of HTN and ESRD on HD via LUE AV fistula who is being seen 09/03/2023 for the evaluation of NSTEMI at the request of the emergency department.  Concern for a primary event given his presenting symptom of chest pain without any signs or symptoms of volume overload or heart failure.  Will plan on ischemic evaluation for further evaluation and management.   NSTEMI, suspect primary event - please make patient NPO now, consideration of cath in the AM  - continue aspirin 81 mg daily (loaded with 325 mg in ER)  - hold on p2y12 inhibitor  - start high intensity statin  - continue heparin infusion ACS protocol  - complete  echocardiogram - check lipid panel  - check hemoglobin A1c    Risk Assessment/Risk Scores:     TIMI Risk Score for Unstable Angina or Non-ST Elevation MI:   The patient's TIMI risk score is 4, which indicates a 20% risk of all cause mortality, new or recurrent myocardial infarction or need for urgent revascularization in the next 14 days.          For questions or updates, please contact  [facility] Please consult [email or web address] for contact info under    Signed, Sharyon Medicus, MD  09/03/2023 2:59 AM

## 2023-09-03 NOTE — Hospital Course (Addendum)
 The patient is a 58 year old AAM with a past medical history significant for ESRD on home hemodialysis Monday, Tuesday, Thursday, Friday, essential hypertension, history of bladder outlet obstruction and BPH, and other comorbidities who presented to ED due to chest pain.  Further workup was done and suspicion is that he had an NSTEMI so cardiology was consulted and placed him on a heparin drip.  His echo was normal and reassuring and he is chest pain-free.  Given that he wants to avoid cardiac catheterization cardiology recommends treating him medically with IV heparin for 48 hours.  His heparin drip will finish on 09/05/2023.  He is medically stable for discharge at this time will need follow-up with PCP and nephrology outpatient setting as well as cardiology given his new NSTEMI  Assessment and Plan:  NSTEMI: Presenting with substernal Chest pressure non-radiating worse with inspiration. No real EKG changes noted. Denies SOB, Orthopnea. Troponin Level went from 300 -> 381 -> 618. Started on ASA 325 mg po Daily and changed to 81 mg po Daily, give a 1x dose of Atorvastatin 40 mg, and initiated on Heparin gtt. Also has NTG 0.4 mg SL q35min PRN CP and Ondansetron for N/V.  Cardiology was consulted and patient wanted to put.  Echocardiogram done that showed a normal EF of 60 to 65% but did show grade 1 diastolic dysfunction and left ventricular no regional wall motion abnormalities. Initial chest x-ray done showed no acute cardiopulmonary process.  Since he is chest pain-free and his echo is reassuring cardiology recommends treating with IV heparin and medically for 48 hours and needs to be continued until 09/05/2018 5 AM.   -Lipid panel done and showed a total cholesterol/HDL ratio 3.3, cholesterol 129, HDL 39, LDL 79, triglycerides of 56, VLDL of 11 and hemoglobin A1c was 4.8 -Recommending aspirin 81 mg daily, atorvastatin 40 g p.o. daily and metoprolol is being consolidated to 25 mg succinate daily.  Cardiology to  be arranging outpatient follow-up for this patient and this is scheduled for 09/16/2023.  He is medically stable for discharge at this time.  Leukocytosis: WBC likely reactive in the setting of NSTEMI. No underlying infectious process at this time. WBC went from 12.6 -> 8.5 -> 7.0 and is now 7 point. CTM for S/Sx of Infection. Repeat CBC in the AM  Essential HTN: Initially amlodipine for now given NSTEMI.  He has now been consolidated to metoprolol succinate 25 mg p.o. daily.  Cardiology is adding his amlodipine back today.  CTM BP per Protocol. Last BP reading was 126/86  HypoNa+: Mild and imporived. Na+ is now 137. CTM and Trend and repeat CMP in the AM  ESRD on Home HD (MTThF) / Metabolic Acidosis: BUN/Cr Trend: Recent Labs  Lab 09/02/23 2039 09/03/23 0539 09/04/23 0339 09/05/23 0223  BUN 62* 65* 40* 44*  CREATININE 13.13* 13.65* 9.26* 11.28*  -MA has improved normalized CO2 is 25, anion gap is 12, chloride level was 99 -Avoid Nephrotoxic Medications, Contrast Dyes, Hypotension and Dehydration to Ensure Adequate Renal Perfusion and will need to Renally Adjust Meds -Continue to Monitor and Trend Renal Function carefully and repeat CMP in the AM  -Consult Nephrology for maintenance of HD  Normocytic Anemia/Anemia of Chronic Kidney Disease: Hgb/Hct went from 11.5/35.5 -> 10.4/31.3 -> 10.9/33.0 and is now 11.1/34.0. Anemia Panel done and showed an iron level of 54, UIBC of 163, TIBC of 217, saturation of 25%, ferritin level of 245.4 normal 8.3 and vitamin B12 760. CTM for S/Sx of Bleeding; No  overt bleeding noted. Repeat CBC in the AM   Constipation: Bowel regimen with MiraLAX 17 g p.o. twice daily, senna docusate 1 tab p.o. twice daily and provide bisacodyl suppository 10 mg RC daily as needed  Hypoalbuminemia: Patient's Albumin is now 3.3. CTM and Trend and repeat CMP in the AM  Overweight: Complicates overall prognosis and care. Estimated body mass index is 31.76 kg/m as calculated from  the following:   Height as of this encounter: 6\' 1"  (1.854 m).   Weight as of this encounter: 109.2 kg. Weight Loss and Dietary Counseling given

## 2023-09-03 NOTE — H&P (Signed)
 History and Physical      Marcella Dunnaway HKV:[id number] DOB: 1965/07/28 DOA: 09/02/2023; DOS: 09/03/2023  PCP: Patient, No Pcp Per (will further assess) Patient coming from: home   I have personally briefly reviewed patient's old medical records in [facility] Link  Chief Complaint: Chest pain  HPI: Daniel Mccarty is a 58 y.o. male with medical history significant for end-stage renal disease on home hemodialysis on Monday, Tuesday, Thursday, Friday schedule, essential hypertension, who is admitted to [facility] on 09/02/2023 with NSTEMI after presenting from home to [facility] ED complaining of chest pain.    The patient reports new onset left-sided, nonradiating chest pressure that started around 1400 on 09/02/2023.  He reports that the pain has been constant since onset, intensifies with exertion, improves with rest, and notes improvement in the associated intensity of this discomfort with nitroglycerin.  He also notes associated shortness of breath, will denying any associated diaphoresis palpitations, nausea, vomiting.  Denies any known history of underlying coronary artery disease.  Not associate with any subjective fever, chills, rigors, or generalized myalgias.  Not associated with any hemoptysis.     ED Course:  Vital signs in the ED were notable for the following: Afebrile; heart rates initially in the low 100s, subsequently decreasing into the 80s with improvement of his chest pain with sublingual nitroglycerin; systolic blood pressures in the 130s to 160s; respiratory rate 16-23, oxygen saturation 94 to 100% on room air.  Labs were notable for the following: BMP was notable for the following: Potassium 4.6.  High-sensitivity troponin I was initially 300, repeat value trending up slightly to 381, without any prior high sensitive troponin I data point available for point comparison.  CBC notable for white cell count of thousand 600, hemoglobin 11.5, with a count of 311.  Per my  interpretation, EKG in ED demonstrated the following: Sinus rhythm with heart rate 99, no evidence of T wave or ST changes, including no evidence of ST elevation.  Imaging in the ED, per corresponding formal radiology read, was notable for the following: 2 view chest x-ray showed no evidence of acute cardiopulmonary process, including no evidence of infiltrate, edema, effusion, or pneumothorax.  Following a single sublingual nitroglycerin in the ED, the patient reports significant improvement in his chest discomfort, noting mild residual chest discomfort at this time.  EDP d/w on-call cardiology fellow, who recommended heparin drip and conveyed that cardiology will formally consult.   While in the ED, the following were administered: Full dose aspirin x 1, heparin bolus followed by initiation heparin drip, sublingual nitroglycerin x 1.  Subsequently, the patient was admitted for further evaluation management of presenting NSTEMI.     Review of Systems: As per HPI otherwise 10 point review of systems negative.   Past Medical History:  Diagnosis Date   Anemia due to chronic kidney disease    Bladder outlet obstruction    BPH (benign prostatic hyperplasia)    ESRD (end stage renal disease) on dialysis (HCC)    due to hypertension;   nephrologist--- dr Marisue Humble,  home hemodialysis on M/ T/ Th/ F thru [facility] in [place] on [place]   Foley catheter in place    Hypertension    S/P arteriovenous (AV) fistula creation 11/09/2020   Secondary hyperparathyroidism of renal origin [facility])     Past Surgical History:  Procedure Laterality Date   AV FISTULA PLACEMENT Left 11/09/2020   Procedure: CREATION OF  LEFT ARM  RADIO- CEPHALIC  ARTERIOVENOUS (AV)  FISTULA;  Surgeon: Chuck Hint, MD;  Location: [facility] OR;  Service: Vascular;  Laterality: Left;   IR FLUORO GUIDE CV LINE RIGHT  11/02/2020   IR US GUIDE VASC ACCESS RIGHT  11/02/2020   TRANSURETHRAL RESECTION OF PROSTATE N/A  04/30/2021   Procedure: TRANSURETHRAL RESECTION OF THE PROSTATE (TURP);  Surgeon: Noel Christmas, MD;  Location: [facility];  Service: Urology;  Laterality: N/A;  7 MINS    Social History:  reports that he has never smoked. He has never used smokeless tobacco. He reports that he does not drink alcohol and does not use drugs.   No Known Allergies  Family history reviewed and not pertinent    Prior to Admission medications   Medication Sig Start Date End Date Taking? Authorizing Provider  amLODipine (NORVASC) 5 MG tablet Take 1 tablet (5 mg total) by mouth daily. Patient taking differently: Take 5 mg by mouth daily. 11/13/20 04/30/21  [place], Eric J, DO  calcitRIOL (ROCALTROL) 0.25 MCG capsule Take 0.25 mcg by mouth daily.    [provider]  Calcium Acetate 667 MG TABS Take 2 tablets by mouth 3 (three) times daily before meals. And take with snacks    [provider]  finasteride (PROSCAR) 5 MG tablet Take 5 mg by mouth daily.    [provider]  tamsulosin (FLOMAX) 0.4 MG CAPS capsule Take 0.4 mg by mouth at bedtime.    [provider]     Objective    Physical Exam: Vitals:   09/03/23 0100 09/03/23 0130 09/03/23 0200 09/03/23 0230  BP: (!) 160/97 (!) 157/93 (!) 154/91 (!) 136/93  Pulse: 92 87 87 85  Resp: (!) 21 18 (!) 23 20  Temp:    99.4 F (37.4 C)  TempSrc:    Oral  SpO2: 95% 96% 94% 95%    General: appears to be stated age; alert, oriented Skin: warm, dry, no rash Head:  AT/ Mouth:  Oral mucosa membranes appear moist, normal dentition Neck: supple; trachea midline Heart:  RRR; did not appreciate any M/R/G Lungs: CTAB, did not appreciate any wheezes, rales, or rhonchi Abdomen: + BS; soft, ND, NT Vascular: 2+ pedal pulses b/l; 2+ radial pulses b/l Extremities: no peripheral edema, no muscle wasting Neuro: strength and sensation intact in upper and lower extremities b/l    Labs on Admission: I have  personally reviewed following labs and imaging studies  CBC: Recent Labs  Lab 09/02/23 2039  WBC 12.6*  HGB 11.5*  HCT 35.5*  MCV 94.9  PLT 311   Basic Metabolic Panel: Recent Labs  Lab 09/02/23 2039  NA 134*  K 4.6  CL 97*  CO2 20*  GLUCOSE 110*  BUN 62*  CREATININE 13.13*  CALCIUM 10.4*   GFR: CrCl cannot be calculated (Unknown ideal weight.). Liver Function Tests: No results for input(s): "AST", "ALT", "ALKPHOS", "BILITOT", "PROT", "ALBUMIN" in the last 168 hours. No results for input(s): "LIPASE", "AMYLASE" in the last 168 hours. No results for input(s): "AMMONIA" in the last 168 hours. Coagulation Profile: No results for input(s): "INR", "PROTIME" in the last 168 hours. Cardiac Enzymes: No results for input(s): "CKTOTAL", "CKMB", "CKMBINDEX", "TROPONINI" in the last 168 hours. BNP (last 3 results) No results for input(s): "PROBNP" in the last 8760 hours. HbA1C: No results for input(s): "HGBA1C" in the last 72 hours. CBG: No results for input(s): "GLUCAP" in the last 168 hours. Lipid Profile: No results for input(s): "CHOL", "HDL", "LDLCALC", "TRIG", "CHOLHDL", "LDLDIRECT" in  the last 72 hours. Thyroid Function Tests: No results for input(s): "TSH", "T4TOTAL", "FREET4", "T3FREE", "THYROIDAB" in the last 72 hours. Anemia Panel: No results for input(s): "VITAMINB12", "FOLATE", "FERRITIN", "TIBC", "IRON", "RETICCTPCT" in the last 72 hours. Urine analysis:    Component Value Date/Time   COLORURINE YELLOW 11/01/2020 1925   APPEARANCEUR HAZY (A) 11/01/2020 1925   LABSPEC 1.011 11/01/2020 1925   PHURINE 6.0 11/01/2020 1925   GLUCOSEU 50 (A) 11/01/2020 1925   HGBUR MODERATE (A) 11/01/2020 1925   BILIRUBINUR NEGATIVE 11/01/2020 1925   KETONESUR NEGATIVE 11/01/2020 1925   PROTEINUR 100 (A) 11/01/2020 1925   UROBILINOGEN 0.2 08/09/2018 1315   NITRITE NEGATIVE 11/01/2020 1925   LEUKOCYTESUR MODERATE (A) 11/01/2020 1925    Radiological Exams on Admission: DG  Chest 2 View Result Date: 09/02/2023 CLINICAL DATA:  Chest pain EXAM: CHEST - 2 VIEW COMPARISON:  11/01/2020 FINDINGS: The heart size and mediastinal contours are within normal limits. Both lungs are clear. The visualized skeletal structures are unremarkable. IMPRESSION: No active cardiopulmonary disease. Electronically Signed   By: Sharlet Salina M.D.   On: 09/02/2023 21:23      Assessment/Plan   Principal Problem:   NSTEMI (non-ST elevated myocardial infarction) [facility]) Active Problems:   Essential hypertension   ESRD (end stage renal disease) (HCC)   Chest pain   Leukocytosis    #) NSTEMI: Diagnosis on the basis of presenting typical sounding aside at chest pressure that has been exertional, improves with rest, and is improved with sublingual nitro glycerin, with mild residual chest pain noted at this time, with presenting troponin initially 300, with repeat trending up slightly to 381.  While it is possible that the patient has a chronically elevated troponin in the setting of his end-stage renal disease on hemodialysis, there are no prior troponin I data points at this time to confirm this.  EKG shows sinus rhythm without overt evidence of acute ischemic changes, including no evidence of ST elevation.   EDP d/w on-call cardiology fellow, who recommended heparin drip and conveyed that cardiology will formally consult.  Heparin drip has been initiated in the ED.  He has received full dose aspirin x 1 in the ED this evening.   Plan: Cardiology formally consult, with additional recommendations to follow, as above.  Atorvastatin 40 mg p.o. x 1 dose now for plaque stabilization qualities.  Continue heparin drip.  Monitor on telemetry.  Repeat CMP in the AM.  Check magnesium level.  Echocardiogram ordered for the morning.  Continue to trend troponin.  Add on BNP.  Continue prn sublingual nitroglycerin.                     #) Leukocytosis: Presenting CBC reflects mildly  elevated white cell count of 12,600. Suspect an reactive element in the setting of presenting NSTEMI.  No evidence to suggest underlying infectious process at this time, including chest x-ray, shows no evidence of acute cardiopulmonary process, including no evidence of infiltrate.  He is also noted to be afebrile.  No additional SIRS criteria met at this time.  In the absence of overt underlying infectious process, criteria for sepsis are not currently met.  Additionally, the appears hemodynamically stable.  Therefore, will refrain from initiation of antibiotics at this time.  Plan: Repeat CBC with diff in the morning.  Monitor strict I's and O's, daily weights.  Further evaluation management of presenting NSTEMI, as above.                       #)  ESRD: on home HD (schedule: Monday, Tuesday, Thursday, Friday). Next due for routine HD on  Thurs, 4/3. No clinical evidence for urgent overnight HD or to expedite HD relative to this timeframe. no e/o hyperkalemia, uremia, or acute volume overload.   Plan: monitor strict I's/O's, daily weights. CMP in the AM. Check mag and phos levels. Will be due for next routine HD on 4/3.                         #) Essential Hypertension: documented h/o such, with outpatient antihypertensive regimen including Norvasc.  SBP's in the ED today: Initially in the 160s mmHg, with ensuing decrease into the 130s following dose of sublingual nitroglycerin associated with improving chest pain.  Will hold home Norvasc for now in the setting of relative contraindication for calcium channel blockers in the context of NSTEMI.  Plan: Close monitoring of subsequent BP via routine VS. hold home Norvasc for now, as above.  Monitor on telemetry.    DVT prophylaxis: SCD's + continuation of heparin drip Code Status: Full code Family Communication: none Disposition Plan: Per Rounding Team Consults called: EDP has discussed with on-call cardiology  fellow , Dr. Jean Rosenthal, who will consult, as further detailed above;  Admission status: inpatient.     I SPENT GREATER THAN 75  MINUTES IN CLINICAL CARE TIME/MEDICAL DECISION-MAKING IN COMPLETING THIS ADMISSION.      Daniel Born Wilbon Obenchain DO [facility]  From 7PM - 7AM   09/03/2023, 2:50 AM

## 2023-09-03 NOTE — Progress Notes (Signed)
   Patient Name: Daniel Mccarty Date of Encounter: 09/03/2023 [facility] Cardiologist: None   Interval Summary  .    Patient denies recurrence of chest pain since yesterday. No dyspnea, palpitations, GI upset reported.   Vital Signs .    Vitals:   09/03/23 0600 09/03/23 0630 09/03/23 0700 09/03/23 0821  BP: (!) 137/95 (!) 152/91 (!) 154/88 133/87  Pulse: 71 70 100 81  Resp: 17 17 19 19   Temp:    98.4 F (36.9 C)  TempSrc:    Oral  SpO2: 94% 96% 95% 94%   No intake or output data in the 24 hours ending 09/03/23 0940    04/30/2021    9:46 AM 04/23/2021   11:26 AM 12/19/2020   11:55 AM  Last 3 Weights  Weight (lbs) 224 lb 12.8 oz 212 lb 192 lb  Weight (kg) 101.969 kg 96.163 kg 87.091 kg      Telemetry/ECG    Sinus rhythm. EGG without acute ischemic changes - Personally Reviewed  Physical Exam .   GEN: No acute distress.   Neck: No JVD Cardiac: RRR, no murmurs, rubs, or gallops.  Respiratory: Clear to auscultation bilaterally. GI: Soft, nontender, non-distended  MS: No edema  Assessment & Plan .     NSTEMI Patient presented to the ED for evaluation of substernal chest pain. By patient description, made worse/felt more aware of discomfort with deep inspiration. Slow improvement in pain yesterday following administration of ASA and sublingual nitroglycerin x1. Troponin 300->381->618. ECG without acute ischemia.  Patient denies chest pain with physical activity or exertional limitation recently. Patient's episode of chest pain somewhat atypical given mild exacerbation with deep inspiration.  Patient wishes to avoid catheterization if possible.  Discussed overall high probability of underlying coronary artery disease given comorbidities.  Given delta troponin of nearly 300, would favor completing catheterization this admission.  Will plan to assess patient's heart structure and function with an ultrasound today and allow for dialysis. Given no active chest pain and  need for hemodialysis, tentative plans for left heart catheterization tomorrow, will discuss timing further with Dr. Cristal Deer. Continue heparin infusion Continue atorvastatin 40 mg Continue as needed nitroglycerin (patient has not needed more since admission) Start metoprolol to tartrate 12.5mg  BID.  Continue ASA 81mg . Lipid panel pending  Hypertension Patient on amlodipine 5 mg at home.  Moderately elevated blood pressure this admission.  Given suspected NSTEMI, start metoprolol titrate as above.  Per primary team ESRD Hypoalbuminemia  For questions or updates, please contact [facility] Please consult [email or web address] for contact info under        Signed, Perlie Gold, PA-C

## 2023-09-03 NOTE — Consult Note (Signed)
 Renal Service Consult Note [facility]  Tamika Nou 09/03/2023 Maree Krabbe, MD Requesting Physician: Dr. Marland Mcalpine  Reason for Consult: ESRD pt w/ chest pain  HPI: The patient is a 58 y.o. year-old w/ PMH as below who presented to ED last night w/ c/o CP. No n/v/diaphoresis. In ED pt afeb, HR 100s, dropped to 80s after sl ntg. BP 140/80, SpO2 94-100% on RA. K 4.6, Hb 11, wbc 6K. EKG nsr, CXR negative. Rx'd w/ sl ntg in ED and cardiology consulted. +trops w/ nstemi. Plan was get new echo and if wall motion changes then do LHC. Pt admitted. We are asked to see for dialysis.     Pt seen in room. No c/o's. Does HD 4x /wk, usual UF is 1.5 L.  No recent issues.    PMH Anemia or esrd H/o bladder outlet obstruction BPH ESRD on HD HTN    ROS - denies CP, no joint pain, no HA, no blurry vision, no rash, no diarrhea, no nausea/ vomiting  Past Surgical History  Past Surgical History:  Procedure Laterality Date   AV FISTULA PLACEMENT Left 11/09/2020   Procedure: CREATION OF  LEFT ARM  RADIO- CEPHALIC  ARTERIOVENOUS (AV) FISTULA;  Surgeon: Chuck Hint, MD;  Location: [facility] OR;  Service: Vascular;  Laterality: Left;   IR FLUORO GUIDE CV LINE RIGHT  11/02/2020   IR US GUIDE VASC ACCESS RIGHT  11/02/2020   TRANSURETHRAL RESECTION OF PROSTATE N/A 04/30/2021   Procedure: TRANSURETHRAL RESECTION OF THE PROSTATE (TURP);  Surgeon: Noel Christmas, MD;  Location: [facility];  Service: Urology;  Laterality: N/A;  90 MINS   Family History History reviewed. No pertinent family history. Social History  reports that he has never smoked. He has never used smokeless tobacco. He reports that he does not drink alcohol and does not use drugs. Allergies No Known Allergies Home medications Prior to Admission medications   Medication Sig Start Date End Date Taking? Authorizing Provider  amLODipine (NORVASC) 5 MG tablet Take 1 tablet (5 mg total) by mouth daily. 11/13/20  05/31/24 Yes [place], Eric J, DO  ASTRAGALUS ROOT PO Take 9,000 mg by mouth daily. C[place]le astragalus root capsules; 4500mg  per capsule   Yes [provider]  BLACK CURRANT SEED OIL PO Take 4,000 mg by mouth daily.   Yes [provider]  CHOLECALCIFEROL PO Take 1 capsule by mouth daily.   Yes [provider]  cinacalcet (SENSIPAR) 60 MG tablet Take 60 mg by mouth daily. Take one (60mg ) tablet by mouth daily. 11/19/21  Yes [provider]  sevelamer carbonate (RENVELA) 800 MG tablet Take 2,400 mg by mouth with breakfast, with lunch, and with evening meal. Take three (800mg ) tablets by mouth three time per day with meals and take one (800mg ) tablet with snacks. 12/17/21  Yes [provider]     Vitals:   09/03/23 0630 09/03/23 0700 09/03/23 0821 09/03/23 1030  BP: (!) 152/91 (!) 154/88 133/87 133/74  Pulse: 70 100 81 69  Resp: 17 19 19 12   Temp:   98.4 F (36.9 C)   TempSrc:   Oral   SpO2: 96% 95% 94% 97%   Exam Gen alert, no distress No rash, cyanosis or gangrene Sclera anicteric, throat clear  No jvd or bruits Chest clear bilat to bases, no rales/ wheezing RRR no MRG Abd soft ntnd no mass or ascites +bs GU nl male MS no joint effusions or deformity Ext no LE or  UE edema, no other edema Neuro is alert, Ox 3 , nf    LFA AVF +bruit , buttonholes x 2      Renal-related home meds: Sensipar 60 every day Renvela 3 ac tid Norvasc 5 qd    OP HD:  HHD  [facility]  Dr Marisue Humble 3h   B400  109.5kg    AVF   Heparin 4000 Last HD 4/1, post wt 109.7kg Mircera 50 mcg sq q 4 wks, last 4/2 Last Hb 11.8    Assessment/ Plan: Chest pain - possible NSTEMI, +trops. Per cardiology they will get new echo and look for wall motion changes.  ESRD: on HHD 4x/week. HD today, 3h .  HTN: cont norvasc Volume: no vol overload on exam. CXR clear.  Anemia of esrd: Hb 10-12, follow.  Secondary hyperparathyroidism: CCa in range, phos a bit high. Cont  binders w/ meals.    Vinson Moselle  MD [facility] 09/03/2023, 12:16 PM  Recent Labs  Lab 09/02/23 2039 09/03/23 0539  HGB 11.5* 10.4*  ALBUMIN  --  3.4*  CALCIUM 10.4* 9.7  PHOS  --  7.5*  CREATININE 13.13* 13.65*  K 4.6 4.2   Inpatient medications:  aspirin EC  81 mg Oral Daily   [START ON 09/04/2023] atorvastatin  40 mg Oral Daily   heparin  2,000 Units Intravenous Once   metoprolol tartrate  12.5 mg Oral BID    heparin     acetaminophen **OR** acetaminophen, melatonin, nitroGLYCERIN, ondansetron (ZOFRAN) IV, perflutren lipid microspheres (DEFINITY) IV suspension

## 2023-09-03 NOTE — Progress Notes (Signed)
 Pt refused RN to check skin on admission states he is "a very private person." Pt denies any skin issues such as breaks in skin, rashes or bruising upon admission.

## 2023-09-03 NOTE — Progress Notes (Signed)
 PROGRESS NOTE    Daniel Mccarty  ZOX:[id number] DOB: 08/07/1965 DOA: 09/02/2023 PCP: Patient, No Pcp Per   Brief Narrative:  The patient is a 58 year old AAM with a past medical history significant for ESRD on home hemodialysis Monday, Tuesday, Thursday, Friday, essential hypertension, history of bladder outlet obstruction and BPH, and other comorbidities who presented to ED due to chest pain.  Further workup was done and suspicion is that he had an NSTEMI so cardiology was consulted and placed him on a heparin drip.  Currently they are deciding whether to take him for cardiac catheterization or not but is made n.p.o. and have ordered an Echocardiogram.  Will notify nephrology for maintenance of hemodialysis as well.  Assessment and Plan:  NSTEMI: Presenting with substernal Chest pressure non-radiating worse with inspiration. No real EKG changes noted. Denies SOB, Orthopnea. Troponin Level went from 300 -> 381 -> 618. Started on ASA 325 mg po Daily, give a 1x dose of Atorvastatin 40 mg, and initiated on Heparin gtt. Also has NTG 0.4 mg SL q46min PRN CP and Ondansetron for N/V. Cards consulted and recommended NPO, ECHOCardiogram, consideration of Cath, Lipid Panel and A1c. Currently Cardiology determining whether to Cath or not but want ECHO done first.  Initial chest x-ray done showed no acute cardiopulmonary process.  Leukocytosis: WBC likely reactive in the setting of NSTEMI. No underlying infectious process at this time. WBC went from 12.6 -> 8.5. CTM for S/Sx of Infection. Repeat CBC in the AM  Essential HTN: Holding Amlodipine for now given NSTEMI. CTM BP per Protocol. Last BP reading was 133/87  HypoNa+: Mild. Na+ is 134. CTM and Trend and repeat CMP in the AM  ESRD on Home HD (MTThF) / Metabolic Acidosis: BUN/Cr Trend: Recent Labs  Lab 09/02/23 2039 09/03/23 0539  BUN 62* 65*  CREATININE 13.13* 13.65*  -Has a slight MA w/ a CO2 of 20, AG of 14, and Chloride Level of 100 now -Avoid  Nephrotoxic Medications, Contrast Dyes, Hypotension and Dehydration to Ensure Adequate Renal Perfusion and will need to Renally Adjust Meds -Continue to Monitor and Trend Renal Function carefully and repeat CMP in the AM  -Consult Nephrology for maintenance of HD  Normocytic Anemia/Anemia of Chronic Kidney Disease: Hgb/Hct went from 11.5/35.5 -> 10.4/31.3. Check Anemia Panel in the AM. CTM for S/Sx of Bleeding; No overt bleeding noted. Repeat CBC in the AM   Hypoalbuminemia: Patient's Albumin was 3.4. CTM and Trend and repeat CMP in the AM  Overweight: Complicates overall prognosis and care. Estimated body mass index is 29.66 kg/m as calculated from the following:   Height as of 04/30/21: 6\' 1"  (1.854 m).   Weight as of 04/30/21: 102 kg. Weight Loss and Dietary Counseling given   DVT prophylaxis: SCDs Start: 09/03/23 0249; Anticoagulated on Heparin gtt    Code Status: Full Code Family Communication: No family present at bedside  Disposition Plan:  Level of care: Progressive Status is: Inpatient Remains inpatient appropriate because: Needs further cardiac workup and cardiology considering cardiac catheterization and he is on heparin drip for now   Consultants:  Cardiology   Procedures:  ECHOCARDIOGRAM ordered  Antimicrobials:  Anti-infectives (From admission, onward)    None       Objective: Vitals:   09/03/23 0600 09/03/23 0630 09/03/23 0700 09/03/23 0821  BP: (!) 137/95 (!) 152/91 (!) 154/88 133/87  Pulse: 71 70 100 81  Resp: 17 17 19 19   Temp:    98.4 F (36.9 C)  TempSrc:  Oral  SpO2: 94% 96% 95% 94%   No intake or output data in the 24 hours ending 09/03/23 0935 There were no vitals filed for this visit.  Data Reviewed: I have personally reviewed following labs and imaging studies  CBC: Recent Labs  Lab 09/02/23 2039 09/03/23 0539  WBC 12.6* 8.5  HGB 11.5* 10.4*  HCT 35.5* 31.3*  MCV 94.9 93.7  PLT 311 265   Basic Metabolic Panel: Recent Labs   Lab 09/02/23 2039 09/03/23 0539  NA 134* 134*  K 4.6 4.2  CL 97* 100  CO2 20* 20*  GLUCOSE 110* 95  BUN 62* 65*  CREATININE 13.13* 13.65*  CALCIUM 10.4* 9.7  MG  --  2.1  PHOS  --  7.5*   GFR: CrCl cannot be calculated (Unknown ideal weight.). Liver Function Tests: Recent Labs  Lab 09/03/23 0539  AST 13*  ALT 12  ALKPHOS 44  BILITOT 0.7  PROT 7.4  ALBUMIN 3.4*   No results for input(s): "LIPASE", "AMYLASE" in the last 168 hours. No results for input(s): "AMMONIA" in the last 168 hours. Coagulation Profile: No results for input(s): "INR", "PROTIME" in the last 168 hours. Cardiac Enzymes: No results for input(s): "CKTOTAL", "CKMB", "CKMBINDEX", "TROPONINI" in the last 168 hours. BNP (last 3 results) No results for input(s): "PROBNP" in the last 8760 hours. HbA1C: No results for input(s): "HGBA1C" in the last 72 hours. CBG: No results for input(s): "GLUCAP" in the last 168 hours. Lipid Profile: No results for input(s): "CHOL", "HDL", "LDLCALC", "TRIG", "CHOLHDL", "LDLDIRECT" in the last 72 hours. Thyroid Function Tests: No results for input(s): "TSH", "T4TOTAL", "FREET4", "T3FREE", "THYROIDAB" in the last 72 hours. Anemia Panel: No results for input(s): "VITAMINB12", "FOLATE", "FERRITIN", "TIBC", "IRON", "RETICCTPCT" in the last 72 hours. Sepsis Labs: No results for input(s): "PROCALCITON", "LATICACIDVEN" in the last 168 hours.  No results found for this or any previous visit (from the past 240 hours).   Radiology Studies: DG Chest 2 View Result Date: 09/02/2023 CLINICAL DATA:  Chest pain EXAM: CHEST - 2 VIEW COMPARISON:  11/01/2020 FINDINGS: The heart size and mediastinal contours are within normal limits. Both lungs are clear. The visualized skeletal structures are unremarkable. IMPRESSION: No active cardiopulmonary disease. Electronically Signed   By: Sharlet Salina M.D.   On: 09/02/2023 21:23   Scheduled Meds:  aspirin  325 mg Oral Daily   [START ON 09/04/2023]  atorvastatin  40 mg Oral Daily   Continuous Infusions:  heparin 1,000 Units/hr (09/03/23 0822)    LOS: 0 days   Marguerita Merles, DO [facility] Available via Epic secure chat 7am-7pm After these hours, please refer to coverage provider listed on [email or web address] 09/03/2023, 9:35 AM

## 2023-09-03 NOTE — Progress Notes (Signed)
 PHARMACY - ANTICOAGULATION CONSULT NOTE  Pharmacy Consult for IV heparin Indication: chest pain/ACS  No Known Allergies  Patient Measurements:    Vital Signs: Temp: 99.4 F (37.4 C) (04/03 0230) Temp Source: Oral (04/03 0230) BP: 136/93 (04/03 0230) Pulse Rate: 85 (04/03 0230)  Labs: Recent Labs    09/02/23 2039 09/02/23 2310  HGB 11.5*  --   HCT 35.5*  --   PLT 311  --   CREATININE 13.13*  --   TROPONINIHS 300* 381*    CrCl cannot be calculated (Unknown ideal weight.).   Medical History: Past Medical History:  Diagnosis Date   Anemia due to chronic kidney disease    Bladder outlet obstruction    BPH (benign prostatic hyperplasia)    ESRD (end stage renal disease) on dialysis (HCC)    due to hypertension;   nephrologist--- dr Marisue Humble,  home hemodialysis on M/ T/ Th/ F thru [facility] in [place] on [address]   Foley catheter in place    Hypertension    S/P arteriovenous (AV) fistula creation 11/09/2020   Secondary hyperparathyroidism of renal origin [facility])    Assessment: Daniel Mccarty is a 58 y.o. year old male admitted on 09/02/2023 with concern for ACS. No anticoagulation prior to admission. Pharmacy consulted to dose heparin.  Goal of Therapy:  Heparin level 0.3-0.7 units/ml Monitor platelets by anticoagulation protocol: Yes   Plan:  Heparin 4000 units x 1 as bolus followed by heparin infusion at 1000 units/hr 8 heparin level  Daily heparin level, CBC, and monitoring for bleeding F/u cards recs  Thank you for allowing pharmacy to participate in this patient's care.  Marja Kays, PharmD Emergency Medicine Clinical Pharmacist 09/03/2023,2:50 AM

## 2023-09-03 NOTE — Progress Notes (Addendum)
 PHARMACY - ANTICOAGULATION CONSULT NOTE  Pharmacy Consult for IV heparin Indication: chest pain/ACS  No Known Allergies  Patient Measurements:    Vital Signs: Temp: 98.4 F (36.9 C) (04/03 0821) Temp Source: Oral (04/03 0821) BP: 133/74 (04/03 1030) Pulse Rate: 69 (04/03 1030)  Labs: Recent Labs    09/02/23 2039 09/02/23 2310 09/03/23 0539 09/03/23 1000  HGB 11.5*  --  10.4*  --   HCT 35.5*  --  31.3*  --   PLT 311  --  265  --   HEPARINUNFRC  --   --   --  <0.10*  CREATININE 13.13*  --  13.65*  --   TROPONINIHS 300* 381* 618*  --     CrCl cannot be calculated (Unknown ideal weight.).   Medical History: Past Medical History:  Diagnosis Date   Anemia due to chronic kidney disease    Bladder outlet obstruction    BPH (benign prostatic hyperplasia)    ESRD (end stage renal disease) on dialysis (HCC)    due to hypertension;   nephrologist--- dr Marisue Humble,  home hemodialysis on M/ T/ Th/ F thru [facility] in [place] on [address]   Foley catheter in place    Hypertension    S/P arteriovenous (AV) fistula creation 11/09/2020   Secondary hyperparathyroidism of renal origin [facility])    Assessment: Daniel Mccarty is a 58 y.o. year old male admitted on 09/02/2023 with concern for ACS. No anticoagulation prior to admission. Pharmacy consulted to dose heparin.  4/3 1200: Heparin level <0.10, subtherapeutic on heparin 1000 units/hr. No issues with infusion running or signs of bleeding per RN. CBC stable (Hgb 10.4, PLT 265).   Goal of Therapy:  Heparin level 0.3-0.7 units/ml Monitor platelets by anticoagulation protocol: Yes   Plan:  Heparin 2000 units IV x1 bolus, then increase to heparin 1200 units/hr 8 heparin level  Daily heparin level, CBC, and monitoring for bleeding F/u cards recs  Thank you for allowing pharmacy to participate in this patient's care.  Enos Fling, PharmD PGY-1 Acute Care Pharmacy Resident 09/03/2023 11:51 AM

## 2023-09-03 NOTE — Plan of Care (Signed)
   Problem: Clinical Measurements: Goal: Cardiovascular complication will be avoided Outcome: Progressing   Problem: Coping: Goal: Level of anxiety will decrease Outcome: Progressing   Problem: Safety: Goal: Ability to remain free from injury will improve Outcome: Progressing

## 2023-09-03 NOTE — Progress Notes (Signed)
  Echocardiogram 2D Echocardiogram has been performed.  Daniel Mccarty Daniel Mccarty 09/03/2023, 12:08 PM

## 2023-09-04 ENCOUNTER — Encounter (HOSPITAL_COMMUNITY)
Admission: EM | Disposition: A | Discharge status: Home / Self Care | Payer: Self-pay | Source: Home / Self Care | Attending: Internal Medicine

## 2023-09-04 DIAGNOSIS — N186 End stage renal disease: Secondary | ICD-10-CM

## 2023-09-04 DIAGNOSIS — I214 Non-ST elevation (NSTEMI) myocardial infarction: Secondary | ICD-10-CM | POA: Diagnosis not present

## 2023-09-04 DIAGNOSIS — I10 Essential (primary) hypertension: Secondary | ICD-10-CM

## 2023-09-04 DIAGNOSIS — K5900 Constipation, unspecified: Secondary | ICD-10-CM

## 2023-09-04 DIAGNOSIS — R079 Chest pain, unspecified: Secondary | ICD-10-CM

## 2023-09-04 DIAGNOSIS — D72829 Elevated white blood cell count, unspecified: Secondary | ICD-10-CM

## 2023-09-04 LAB — COMPREHENSIVE METABOLIC PANEL WITH GFR
ALT: 11 U/L (ref 0–44)
AST: 11 U/L — ABNORMAL LOW (ref 15–41)
Albumin: 3.3 g/dL — ABNORMAL LOW (ref 3.5–5.0)
Alkaline Phosphatase: 41 U/L (ref 38–126)
Anion gap: 12 (ref 5–15)
BUN: 40 mg/dL — ABNORMAL HIGH (ref 6–20)
CO2: 25 mmol/L (ref 22–32)
Calcium: 9.4 mg/dL (ref 8.9–10.3)
Chloride: 99 mmol/L (ref 98–111)
Creatinine, Ser: 9.26 mg/dL — ABNORMAL HIGH (ref 0.61–1.24)
GFR, Estimated: 6 mL/min — ABNORMAL LOW (ref >60)
Glucose, Bld: 117 mg/dL — ABNORMAL HIGH (ref 70–99)
Potassium: 4 mmol/L (ref 3.5–5.1)
Sodium: 136 mmol/L (ref 135–145)
Total Bilirubin: 0.5 mg/dL (ref 0.0–1.2)
Total Protein: 7.3 g/dL (ref 6.5–8.1)

## 2023-09-04 LAB — HEPARIN LEVEL (UNFRACTIONATED)
Heparin Unfractionated: <0.1 [IU]/mL — ABNORMAL LOW (ref 0.30–0.70)
Heparin Unfractionated: 0.13 [IU]/mL — ABNORMAL LOW (ref 0.30–0.70)

## 2023-09-04 LAB — CBC WITH DIFFERENTIAL/PLATELET
Abs Immature Granulocytes: 0.03 10*3/uL (ref 0.00–0.07)
Basophils Absolute: 0 10*3/uL (ref 0.0–0.1)
Basophils Relative: 1 %
Eosinophils Absolute: 0.2 10*3/uL (ref 0.0–0.5)
Eosinophils Relative: 3 %
HCT: 33 % — ABNORMAL LOW (ref 39.0–52.0)
Hemoglobin: 10.9 g/dL — ABNORMAL LOW (ref 13.0–17.0)
Immature Granulocytes: 0 %
Lymphocytes Relative: 15 %
Lymphs Abs: 1.1 10*3/uL (ref 0.7–4.0)
MCH: 30.6 pg (ref 26.0–34.0)
MCHC: 33 g/dL (ref 30.0–36.0)
MCV: 92.7 fL (ref 80.0–100.0)
Monocytes Absolute: 0.8 10*3/uL (ref 0.1–1.0)
Monocytes Relative: 12 %
Neutro Abs: 4.9 10*3/uL (ref 1.7–7.7)
Neutrophils Relative %: 69 %
Platelets: 267 10*3/uL (ref 150–400)
RBC: 3.56 MIL/uL — ABNORMAL LOW (ref 4.22–5.81)
RDW: 12.9 % (ref 11.5–15.5)
WBC: 7 10*3/uL (ref 4.0–10.5)
nRBC: 0 % (ref 0.0–0.2)

## 2023-09-04 LAB — GLUCOSE, CAPILLARY
Glucose-Capillary: 110 mg/dL — ABNORMAL HIGH (ref 70–99)
Glucose-Capillary: 135 mg/dL — ABNORMAL HIGH (ref 70–99)
Glucose-Capillary: 100 mg/dL — ABNORMAL HIGH (ref 70–99)

## 2023-09-04 LAB — HEPATITIS B SURFACE ANTIBODY, QUANTITATIVE: Hep B S AB Quant (Post): 3239 m[IU]/mL

## 2023-09-04 LAB — MAGNESIUM: Magnesium: 2.1 mg/dL (ref 1.7–2.4)

## 2023-09-04 LAB — PHOSPHORUS: Phosphorus: 5.2 mg/dL — ABNORMAL HIGH (ref 2.5–4.6)

## 2023-09-04 SURGERY — LEFT HEART CATH AND CORONARY ANGIOGRAPHY
Anesthesia: LOCAL

## 2023-09-04 MED ORDER — BISACODYL 10 MG RE SUPP: 10.0000 mg | Freq: Every day | RECTAL | Status: DC | PRN

## 2023-09-04 MED ORDER — AMLODIPINE BESYLATE 5 MG PO TABS
5.0000 mg | ORAL_TABLET | Freq: Every day | ORAL | Status: DC
  Administered 2023-09-04 – 2023-09-05 (×2): 5 mg via ORAL
  Filled 2023-09-04 (×2): qty 1

## 2023-09-04 MED ORDER — HEPARIN (PORCINE) 25000 UT/250ML-% IV SOLN
2000.0000 [IU]/h | INTRAVENOUS | Status: AC
  Administered 2023-09-04: 1800 [IU]/h via INTRAVENOUS
  Administered 2023-09-05: 2000 [IU]/h via INTRAVENOUS
  Filled 2023-09-04 (×2): qty 250

## 2023-09-04 MED ORDER — METOPROLOL SUCCINATE ER 25 MG PO TB24
25.0000 mg | ORAL_TABLET | Freq: Every day | ORAL | Status: DC
  Administered 2023-09-04 – 2023-09-05 (×2): 25 mg via ORAL
  Filled 2023-09-04 (×2): qty 1

## 2023-09-04 MED ORDER — SENNOSIDES-DOCUSATE SODIUM 8.6-50 MG PO TABS
1.0000 | ORAL_TABLET | Freq: Two times a day (BID) | ORAL | Status: DC
  Administered 2023-09-04: 1 via ORAL
  Filled 2023-09-04 (×3): qty 1

## 2023-09-04 MED ORDER — HEPARIN BOLUS VIA INFUSION
2000.0000 [IU] | Freq: Once | INTRAVENOUS | Status: AC
  Administered 2023-09-04: 2000 [IU] via INTRAVENOUS
  Filled 2023-09-04: qty 2000

## 2023-09-04 MED ORDER — HEPARIN BOLUS VIA INFUSION
3000.0000 [IU] | Freq: Once | INTRAVENOUS | Status: AC
  Administered 2023-09-04: 3000 [IU] via INTRAVENOUS
  Filled 2023-09-04: qty 3000

## 2023-09-04 MED ORDER — POLYETHYLENE GLYCOL 3350 17 G PO PACK
17.0000 g | PACK | Freq: Two times a day (BID) | ORAL | Status: DC
  Administered 2023-09-04: 17 g via ORAL
  Filled 2023-09-04 (×3): qty 1

## 2023-09-04 NOTE — Progress Notes (Signed)
 Rounding Note    Patient Name: Daniel Mccarty Date of Encounter: 09/04/2023  [facility] Cardiologist: Jodelle Red, MD   Subjective   No acute events overnight. No chest pain. Reviewed the results of his echo with him, reassuring. Given this, he would like to proceed with medical management and not cath.  Inpatient Medications    Scheduled Meds:  aspirin EC  81 mg Oral Daily   atorvastatin  40 mg Oral Daily   Chlorhexidine Gluconate Cloth  6 each Topical Q0600   metoprolol tartrate  12.5 mg Oral BID   Continuous Infusions:  heparin 1,600 Units/hr (09/04/23 0456)   PRN Meds: acetaminophen **OR** acetaminophen, melatonin, nitroGLYCERIN, ondansetron (ZOFRAN) IV   Vital Signs    Vitals:   09/03/23 2321 09/03/23 2346 09/04/23 0000 09/04/23 0435  BP: 131/83 129/84 138/88 (!) 144/89  Pulse: 74 69 70 78  Resp: 20 11 19 19   Temp:  98.2 F (36.8 C) 98.1 F (36.7 C) 98.5 F (36.9 C)  TempSrc:  Oral Oral Oral  SpO2: 96% 93% 97% 97%  Weight:    110.4 kg  Height:        Intake/Output Summary (Last 24 hours) at 09/04/2023 0735 Last data filed at 09/04/2023 0500 Gross per 24 hour  Intake 429.75 ml  Output 1675 ml  Net -1245.25 ml      09/04/2023    4:35 AM 09/03/2023    1:18 PM 04/30/2021    9:46 AM  Last 3 Weights  Weight (lbs) 243 lb 6.2 oz 240 lb 4.8 oz 224 lb 12.8 oz  Weight (kg) 110.4 kg 109 kg 101.969 kg      Telemetry    SR - Personally Reviewed  Physical Exam   GEN: No acute distress.   Neck: No JVD Cardiac: RRR, no murmurs, rubs, or gallops.  Respiratory: Clear to auscultation bilaterally. GI: Soft, nontender, non-distended  MS: No edema; No deformity. Neuro:  Nonfocal  Psych: Normal affect   New pertinent results (labs, ECG, imaging, cardiac studies)    Echo 1. Left ventricular ejection fraction, by estimation, is 65 to 70%. Left  ventricular ejection fraction by PLAX is 66 %. The left ventricle has  normal function. The left  ventricle has no regional wall motion  abnormalities. There is mild left ventricular  hypertrophy. Left ventricular diastolic parameters are consistent with  Grade I diastolic dysfunction (impaired relaxation).   2. Right ventricular systolic function is normal. The right ventricular  size is normal. Tricuspid regurgitation signal is inadequate for assessing  PA pressure.   3. Left atrial size was moderately dilated.   4. The mitral valve is abnormal. Trivial mitral valve regurgitation.   5. The aortic valve is tricuspid. Aortic valve regurgitation is not  visualized.   Assessment & Plan    NSTEMI -now chest pain free, echo reassuring, images personally reviewed -we discussed options. He would like to avoid cath. Would treat with IV heparin for 48 hours for medical management. Heparin started 2 AM 4/3, would continue to 4/5 AM -continue atorvastatin 40 mg daily -continue aspirin 81 mg daily -consolidating to metoprolol succinate 25 mg daily  Hypertension -was on amlodipine at home. Held this admission, started metoprolol this admission as above -Blood pressures have been somewhat variable but does have Bps into 140s-150s. Will add back amlodipine today  ESRD: per nephrology  Anticipate he can be discharged tomorrow AM from cards standpoint after heparin completed. Unsure if he will need dialysis prior to discharge,  defer to nephrology team.  [facility] will sign off.  Please contact us with new questions or concerns Medication Recommendations:  Aspirin, atorvastatin, metoprolol, amlodipine as above (as currently ordered) Other recommendations (labs, testing, etc):  None Follow up as an outpatient:  We have arranged for outpatient cardiology follow up, information placed in discharge instructions.     Signed, Jodelle Red, MD  09/04/2023, 7:35 AM

## 2023-09-04 NOTE — Progress Notes (Signed)
 PHARMACY - ANTICOAGULATION Pharmacy Consult for heparin Indication: chest pain/ACS Brief A/P: Heparin level subtherapeutic Increase Heparin rate  No Known Allergies  Patient Measurements: Height: 6\' 1"  (185.4 cm) Weight: 110.4 kg (243 lb 6.2 oz) IBW/kg (Calculated) : 79.9 HEPARIN DW (KG): 102.6  Vital Signs: Temp: 98.5 F (36.9 C) (04/04 0435) Temp Source: Oral (04/04 0435) BP: 144/89 (04/04 0435) Pulse Rate: 78 (04/04 0435)  Labs: Recent Labs    09/02/23 2039 09/02/23 2310 09/03/23 0539 09/03/23 1000 09/04/23 0339  HGB 11.5*  --  10.4*  --  10.9*  HCT 35.5*  --  31.3*  --  33.0*  PLT 311  --  265  --  267  HEPARINUNFRC  --   --   --  <0.10* <0.10*  CREATININE 13.13*  --  13.65*  --  9.26*  TROPONINIHS 300* 381* 618*  --   --     Estimated Creatinine Clearance: 11.6 mL/min (A) (by C-G formula based on SCr of 9.26 mg/dL (H)).  Assessment: 58 y.o. male with chest pain for heparin    Goal of Therapy:  Heparin level 0.3-0.7 units/ml Monitor platelets by anticoagulation protocol: Yes   Plan:  Heparin 2000 units IV bolus, then increase heparin 1600 units/hr Check heparin level in 6 hours.   Geannie Risen, PharmD, BCPS   09/04/2023 4:47 AM

## 2023-09-04 NOTE — Plan of Care (Signed)

## 2023-09-04 NOTE — Progress Notes (Signed)
 F/u scheduled 4/16 - scheduled NL location due to pt's HD schedule and [facility] appts not currently available at that time. Long term anticipate [facility] f/u with Dr. Cristal Deer. Outlined location on AVS.

## 2023-09-04 NOTE — Progress Notes (Addendum)
 Subjective:  seen in Rm  with no cos, denies CP, SOB / Said tolerated HD yest ( Normal  HOME  HD [facility]F ) Noted Card  Medical RX  No cath .   Objective Vital signs in last 24 hours: Vitals:   09/04/23 0000 09/04/23 0435 09/04/23 0814 09/04/23 0913  BP: 138/88 (!) 144/89 (!) 148/85 (!) 142/85  Pulse: 70 78 83 79  Resp: 19 19 15    Temp: 98.1 F (36.7 C) 98.5 F (36.9 C) 98.5 F (36.9 C)   TempSrc: Oral Oral Oral   SpO2: 97% 97% 98%   Weight:  110.4 kg    Height:       Weight change:   Physical Exam: General: alert NAD Heart: RRR No MRG Lungs: CTA nonlabored RA Abdomen: NABS, Soft, NT, ND Extremities: No Pedal edema Dialysis Access: LFA AVF  + bruit      OP HD:  HHD  [facility]  Dr Marisue Humble 3h   B400  109.5kg    AVF   Heparin 4000 Last HD 4/1, post wt 109.7kg Mircera 50 mcg sq q 4 wks, last 4/2 Last Hb 11.8   Renal-related home meds: Sensipar 60 every day Renvela 3 ac tid Norvasc 5 qd   Problem/Plan:  Chest pain - possible NSTEMI, +trops. ECHO showed no new WM changes. No plan for cath. Medical Rx.  ESRD: on HHD 4x/week. HD yest  3h . , next hd would be tomor but at home.  Ok for [employer] home per Renal standpoint  HTN: cont norvasc Volume: no vol overload on exam. CXR clear.  Anemia of esrd: Hb 10.9  , ESA needs for now  Secondary hyperparathyroidism: CCa in range, phos 5.2 <7.5 Cont binders w/ meals.   Lenny Pastel, PA-C [facility] Beeper [phone] 09/04/2023,10:55 AM  LOS: 1 day   Pt seen, examined and agree w A/P as above.  Vinson Moselle MD  [facility] 09/04/2023, 12:04 PM   Labs: Basic Metabolic Panel: Recent Labs  Lab 09/02/23 2039 09/03/23 0539 09/04/23 0339  NA 134* 134* 136  K 4.6 4.2 4.0  CL 97* 100 99  CO2 20* 20* 25  GLUCOSE 110* 95 117*  BUN 62* 65* 40*  CREATININE 13.13* 13.65* 9.26*  CALCIUM 10.4* 9.7 9.4  PHOS  --  7.5* 5.2*   Liver Function Tests: Recent Labs  Lab 09/03/23 0539 09/04/23 0339  AST 13* 11*  ALT 12 11   ALKPHOS 44 41  BILITOT 0.7 0.5  PROT 7.4 7.3  ALBUMIN 3.4* 3.3*   No results for input(s): "LIPASE", "AMYLASE" in the last 168 hours. No results for input(s): "AMMONIA" in the last 168 hours. CBC: Recent Labs  Lab 09/02/23 2039 09/03/23 0539 09/04/23 0339  WBC 12.6* 8.5 7.0  NEUTROABS  --   --  4.9  HGB 11.5* 10.4* 10.9*  HCT 35.5* 31.3* 33.0*  MCV 94.9 93.7 92.7  PLT 311 265 267   Cardiac Enzymes: No results for input(s): "CKTOTAL", "CKMB", "CKMBINDEX", "TROPONINI" in the last 168 hours. CBG: Recent Labs  Lab 09/04/23 0543  GLUCAP 110*    Studies/Results: ECHOCARDIOGRAM COMPLETE Result Date: 09/03/2023    ECHOCARDIOGRAM REPORT   Patient Name:   Daniel Mccarty Date of Exam: 09/03/2023 Medical Rec #:  [id number]      Height:       73.0 in Accession #:    [id number]     Weight:       224.8 lb Date of Birth:  Jun 20, 1965  BSA:          2.261 m Patient Age:    57 years       BP:           133/74 mmHg Patient Gender: M              HR:           58 bpm. Exam Location:  Inpatient Procedure: 2D Echo, Cardiac Doppler, Color Doppler and Intracardiac            Opacification Agent (Both Spectral and Color Flow Doppler were            utilized during procedure). Indications:    NSTEMI  History:        Patient has no prior history of Echocardiogram examinations.                 Risk Factors:Hypertension.  Sonographer:    Karma Ganja Referring Phys: [id number] Angie Fava  Sonographer Comments: Technically difficult study due to poor echo windows. Global longitudinal strain was attempted. IMPRESSIONS  1. Left ventricular ejection fraction, by estimation, is 65 to 70%. Left ventricular ejection fraction by PLAX is 66 %. The left ventricle has normal function. The left ventricle has no regional wall motion abnormalities. There is mild left ventricular hypertrophy. Left ventricular diastolic parameters are consistent with Grade I diastolic dysfunction (impaired relaxation).  2. Right ventricular  systolic function is normal. The right ventricular size is normal. Tricuspid regurgitation signal is inadequate for assessing PA pressure.  3. Left atrial size was moderately dilated.  4. The mitral valve is abnormal. Trivial mitral valve regurgitation.  5. The aortic valve is tricuspid. Aortic valve regurgitation is not visualized. Comparison(s): No prior Echocardiogram. FINDINGS  Left Ventricle: Left ventricular ejection fraction, by estimation, is 65 to 70%. Left ventricular ejection fraction by PLAX is 66 %. The left ventricle has normal function. The left ventricle has no regional wall motion abnormalities. Definity contrast agent was given IV to delineate the left ventricular endocardial borders. The left ventricular internal cavity size was normal in size. There is mild left ventricular hypertrophy. Left ventricular diastolic parameters are consistent with Grade I diastolic dysfunction (impaired relaxation). Indeterminate filling pressures. Right Ventricle: The right ventricular size is normal. No increase in right ventricular wall thickness. Right ventricular systolic function is normal. Tricuspid regurgitation signal is inadequate for assessing PA pressure. Left Atrium: Left atrial size was moderately dilated. Right Atrium: Right atrial size was normal in size. Pericardium: There is no evidence of pericardial effusion. Mitral Valve: The mitral valve is abnormal. Mild to moderate mitral annular calcification. Trivial mitral valve regurgitation. MV peak gradient, 5.7 mmHg. The mean mitral valve gradient is 2.0 mmHg. Tricuspid Valve: The tricuspid valve is not well visualized. Tricuspid valve regurgitation is not demonstrated. Aortic Valve: The aortic valve is tricuspid. Aortic valve regurgitation is not visualized. Aortic valve mean gradient measures 6.0 mmHg. Aortic valve peak gradient measures 10.8 mmHg. Aortic valve area, by VTI measures 2.46 cm. Pulmonic Valve: The pulmonic valve was normal in structure.  Pulmonic valve regurgitation is not visualized. Aorta: The aortic root and ascending aorta are structurally normal, with no evidence of dilitation. Venous: The inferior vena cava was not well visualized. IAS/Shunts: No atrial level shunt detected by color flow Doppler.  LEFT VENTRICLE PLAX 2D LV EF:         Left            Diastology  ventricular     LV e' medial:    7.40 cm/s                ejection        LV E/e' medial:  13.5                fraction by     LV e' lateral:   6.85 cm/s                PLAX is 66      LV E/e' lateral: 14.6                %. LVIDd:         4.90 cm LVIDs:         3.10 cm LV PW:         1.20 cm LV IVS:        1.20 cm LVOT diam:     2.10 cm LV SV:         90 LV SV Index:   40 LVOT Area:     3.46 cm  RIGHT VENTRICLE RV Basal diam:  4.60 cm RV S prime:     13.90 cm/s TAPSE (M-mode): 2.8 cm LEFT ATRIUM            Index        RIGHT ATRIUM           Index LA diam:      4.50 cm  1.99 cm/m   RA Area:     19.00 cm LA Vol (A2C): 101.0 ml 44.67 ml/m  RA Volume:   52.40 ml  23.17 ml/m LA Vol (A4C): 80.3 ml  35.51 ml/m  AORTIC VALVE AV Area (Vmax):    2.34 cm AV Area (Vmean):   2.36 cm AV Area (VTI):     2.46 cm AV Vmax:           164.00 cm/s AV Vmean:          111.000 cm/s AV VTI:            0.366 m AV Peak Grad:      10.8 mmHg AV Mean Grad:      6.0 mmHg LVOT Vmax:         111.00 cm/s LVOT Vmean:        75.600 cm/s LVOT VTI:          0.260 m LVOT/AV VTI ratio: 0.71  AORTA Ao Root diam: 3.60 cm MITRAL VALVE MV Area (PHT): 3.08 cm     SHUNTS MV Area VTI:   2.15 cm     Systemic VTI:  0.26 m MV Peak grad:  5.7 mmHg     Systemic Diam: 2.10 cm MV Mean grad:  2.0 mmHg MV Vmax:       1.19 m/s MV Vmean:      70.1 cm/s MV Decel Time: 246 msec MV E velocity: 100.00 cm/s MV A velocity: 112.00 cm/s MV E/A ratio:  0.89 Zoila Shutter MD Electronically signed by Zoila Shutter MD Signature Date/Time: 09/03/2023/2:10:07 PM    Final    DG Chest 2 View Result Date: 09/02/2023 CLINICAL DATA:   Chest pain EXAM: CHEST - 2 VIEW COMPARISON:  11/01/2020 FINDINGS: The heart size and mediastinal contours are within normal limits. Both lungs are clear. The visualized skeletal structures are unremarkable. IMPRESSION: No active cardiopulmonary disease. Electronically Signed   By: Sharlet Salina M.D.   On: 09/02/2023 21:23   Medications:  heparin 1,600 Units/hr (09/04/23 0456)    amLODipine  5 mg Oral Daily   aspirin EC  81 mg Oral Daily   atorvastatin  40 mg Oral Daily   Chlorhexidine Gluconate Cloth  6 each Topical Q0600   metoprolol succinate  25 mg Oral Daily   polyethylene glycol  17 g Oral BID   senna-docusate  1 tablet Oral BID

## 2023-09-04 NOTE — Progress Notes (Signed)
 PHARMACY - ANTICOAGULATION Pharmacy Consult for heparin Indication: chest pain/ACS Brief A/P: Heparin level subtherapeutic Increase Heparin rate  No Known Allergies  Patient Measurements: Height: 6\' 1"  (185.4 cm) Weight: 110.4 kg (243 lb 6.2 oz) IBW/kg (Calculated) : 79.9 HEPARIN DW (KG): 102.6  Vital Signs: Temp: 98.5 F (36.9 C) (04/04 1135) Temp Source: Oral (04/04 1135) BP: 126/86 (04/04 1135) Pulse Rate: 67 (04/04 1135)  Labs: Recent Labs    09/02/23 2039 09/02/23 2310 09/03/23 0539 09/03/23 1000 09/04/23 0339 09/04/23 1157  HGB 11.5*  --  10.4*  --  10.9*  --   HCT 35.5*  --  31.3*  --  33.0*  --   PLT 311  --  265  --  267  --   HEPARINUNFRC  --   --   --  <0.10* <0.10* 0.13*  CREATININE 13.13*  --  13.65*  --  9.26*  --   TROPONINIHS 300* 381* 618*  --   --   --     Estimated Creatinine Clearance: 11.6 mL/min (A) (by C-G formula based on SCr of 9.26 mg/dL (H)).  Assessment: 58 y.o. male with chest pain for heparin. Heparin level came back subtherapeutic again. Plan to continue until tomorrow AM.  Hgb 10s, plt wnl  Goal of Therapy:  Heparin level 0.3-0.7 units/ml Monitor platelets by anticoagulation protocol: Yes   Plan:  Heparin 3000 units IV bolus, then increase heparin 1800 units/hr until AM Check heparin level in 8 hours.   Ulyses Southward, PharmD, BCIDP, AAHIVP, CPP Infectious Disease Pharmacist 09/04/2023 12:58 PM

## 2023-09-04 NOTE — Progress Notes (Signed)
 PROGRESS NOTE    Daniel Mccarty  ZOX:[id number] DOB: 12-08-1965 DOA: 09/02/2023 PCP: Patient, No Pcp Per   Brief Narrative:  The patient is a 58 year old AAM with a past medical history significant for ESRD on home hemodialysis Monday, Tuesday, Thursday, Friday, essential hypertension, history of bladder outlet obstruction and BPH, and other comorbidities who presented to ED due to chest pain.  Further workup was done and suspicion is that he had an NSTEMI so cardiology was consulted and placed him on a heparin drip.  His echo was normal and reassuring and he is chest pain-free.  Given that he wants to avoid cardiac catheterization cardiology recommends treating him medically with IV heparin for 48 hours.  His heparin drip will finish on 09/05/2023.  Assessment and Plan:  NSTEMI: Presenting with substernal Chest pressure non-radiating worse with inspiration. No real EKG changes noted. Denies SOB, Orthopnea. Troponin Level went from 300 -> 381 -> 618. Started on ASA 325 mg po Daily and changed to 81 mg po Daily, give a 1x dose of Atorvastatin 40 mg, and initiated on Heparin gtt. Also has NTG 0.4 mg SL q63min PRN CP and Ondansetron for N/V.  Cardiology was consulted and patient wanted to put.  Echocardiogram done that showed a normal EF of 60 to 65% but did show grade 1 diastolic dysfunction and left ventricular no regional wall motion abnormalities. Initial chest x-ray done showed no acute cardiopulmonary process.  Since he is chest pain-free and his echo is reassuring cardiology recommends treating with IV heparin and medically for 48 hours and needs to be continued until 09/05/2018 5 AM.   -Lipid panel done and showed a total cholesterol/HDL ratio 3.3, cholesterol 129, HDL 39, LDL 79, triglycerides of 56, VLDL of 11 and hemoglobin A1c was 4.8 -Recommending aspirin 81 mg daily, atorvastatin 40 g p.o. daily and metoprolol is being consolidated to 25 mg succinate daily.  Cardiology to be arranging outpatient  follow-up for this patient and this is scheduled for 09/16/2023.  Leukocytosis: WBC likely reactive in the setting of NSTEMI. No underlying infectious process at this time. WBC went from 12.6 -> 8.5 -> 7.0. CTM for S/Sx of Infection. Repeat CBC in the AM  Essential HTN: Initially amlodipine for now given NSTEMI.  He has now been consolidated to metoprolol succinate 25 mg p.o. daily.  Cardiology is adding his amlodipine back today.  CTM BP per Protocol. Last BP reading was 126/86  HypoNa+: Mild and imporived. Na+ is now 136. CTM and Trend and repeat CMP in the AM  ESRD on Home HD (MTThF) / Metabolic Acidosis: BUN/Cr Trend: Recent Labs  Lab 09/02/23 2039 09/03/23 0539 09/04/23 0339  BUN 62* 65* 40*  CREATININE 13.13* 13.65* 9.26*  -MA has improved normalized CO2 is 25, anion gap is 12, chloride level was 99 -Avoid Nephrotoxic Medications, Contrast Dyes, Hypotension and Dehydration to Ensure Adequate Renal Perfusion and will need to Renally Adjust Meds -Continue to Monitor and Trend Renal Function carefully and repeat CMP in the AM  -Consult Nephrology for maintenance of HD  Normocytic Anemia/Anemia of Chronic Kidney Disease: Hgb/Hct went from 11.5/35.5 -> 10.4/31.3 -> 10.9/33.0. Anemia Panel to be done in the morning. CTM for S/Sx of Bleeding; No overt bleeding noted. Repeat CBC in the AM   Constipation: Bowel regimen with MiraLAX 17 g p.o. twice daily, senna docusate 1 tab p.o. twice daily and provide bisacodyl suppository 10 mg RC daily as needed  Hypoalbuminemia: Patient's Albumin is now 3.3. CTM and Trend and  repeat CMP in the AM  Overweight: Complicates overall prognosis and care. Estimated body mass index is 32.11 kg/m as calculated from the following:   Height as of this encounter: 6\' 1"  (1.854 m).   Weight as of this encounter: 110.4 kg. Weight Loss and Dietary Counseling given   DVT prophylaxis: heparin bolus via infusion 3,000 Units Start: 09/04/23 1345 SCDs Start: 09/03/23  0249    Code Status: Full Code Family Communication: No family present at bedside  Disposition Plan:  Level of care: Progressive Status is: Inpatient Remains inpatient appropriate because: Continue medical treatment for NSTEMI with IV heparin for 48 hours   Consultants:  Cardiology Nephrology  Procedures:  Echocardiogram  Antimicrobials:  Anti-infectives (From admission, onward)    None       Subjective: Seen and examined at bedside and states that he has no chest pain or shortness breath.  Thinks he is doing better.  No nausea or vomiting.  Has not had a bowel movement about 3 days.  No other concerns or complaints at this time.  Objective: Vitals:   09/04/23 0435 09/04/23 0814 09/04/23 0913 09/04/23 1135  BP: (!) 144/89 (!) 148/85 (!) 142/85 126/86  Pulse: 78 83 79 67  Resp: 19 15  16   Temp: 98.5 F (36.9 C) 98.5 F (36.9 C)  98.5 F (36.9 C)  TempSrc: Oral Oral  Oral  SpO2: 97% 98%  97%  Weight: 110.4 kg     Height:        Intake/Output Summary (Last 24 hours) at 09/04/2023 1326 Last data filed at 09/04/2023 1019 Gross per 24 hour  Intake 669.75 ml  Output 1225 ml  Net -555.25 ml   Filed Weights   09/03/23 1318 09/04/23 0435  Weight: 109 kg 110.4 kg   Examination: Physical Exam:  Constitutional: WN/WD obese African-American male in no acute distress Respiratory: Diminished to auscultation bilaterally, no wheezing, rales, rhonchi or crackles. Normal respiratory effort and patient is not tachypenic. No accessory muscle use.  Unlabored breathing Cardiovascular: RRR, no murmurs / rubs / gallops. S1 and S2 auscultated. No extremity edema.  Abdomen: Soft, non-tender, distended secondary to body habitus. Bowel sounds positive.  GU: Deferred. Musculoskeletal: No clubbing / cyanosis of digits/nails. No joint deformity upper and lower extremities.  Skin: No rashes, lesions, ulcers on a limited evaluation. No induration; Warm and dry.  Neurologic: CN 2-12 grossly  intact with no focal deficits. Romberg sign and cerebellar reflexes not assessed.  Psychiatric: Normal judgment and insight. Alert and oriented x 3. Normal mood and appropriate affect.   Data Reviewed: I have personally reviewed following labs and imaging studies  CBC: Recent Labs  Lab 09/02/23 2039 09/03/23 0539 09/04/23 0339  WBC 12.6* 8.5 7.0  NEUTROABS  --   --  4.9  HGB 11.5* 10.4* 10.9*  HCT 35.5* 31.3* 33.0*  MCV 94.9 93.7 92.7  PLT 311 265 267   Basic Metabolic Panel: Recent Labs  Lab 09/02/23 2039 09/03/23 0539 09/04/23 0339  NA 134* 134* 136  K 4.6 4.2 4.0  CL 97* 100 99  CO2 20* 20* 25  GLUCOSE 110* 95 117*  BUN 62* 65* 40*  CREATININE 13.13* 13.65* 9.26*  CALCIUM 10.4* 9.7 9.4  MG  --  2.1 2.1  PHOS  --  7.5* 5.2*   GFR: Estimated Creatinine Clearance: 11.6 mL/min (A) (by C-G formula based on SCr of 9.26 mg/dL (H)). Liver Function Tests: Recent Labs  Lab 09/03/23 0539 09/04/23 0339  AST 13*  11*  ALT 12 11  ALKPHOS 44 41  BILITOT 0.7 0.5  PROT 7.4 7.3  ALBUMIN 3.4* 3.3*   No results for input(s): "LIPASE", "AMYLASE" in the last 168 hours. No results for input(s): "AMMONIA" in the last 168 hours. Coagulation Profile: No results for input(s): "INR", "PROTIME" in the last 168 hours. Cardiac Enzymes: No results for input(s): "CKTOTAL", "CKMB", "CKMBINDEX", "TROPONINI" in the last 168 hours. BNP (last 3 results) No results for input(s): "PROBNP" in the last 8760 hours. HbA1C: Recent Labs    09/03/23 1334  HGBA1C 4.8   CBG: Recent Labs  Lab 09/04/23 0543 09/04/23 1133  GLUCAP 110* 135*   Lipid Profile: Recent Labs    09/03/23 1334  CHOL 129  HDL 39*  LDLCALC 79  TRIG 56  CHOLHDL 3.3   Thyroid Function Tests: No results for input(s): "TSH", "T4TOTAL", "FREET4", "T3FREE", "THYROIDAB" in the last 72 hours. Anemia Panel: No results for input(s): "VITAMINB12", "FOLATE", "FERRITIN", "TIBC", "IRON", "RETICCTPCT" in the last 72  hours. Sepsis Labs: No results for input(s): "PROCALCITON", "LATICACIDVEN" in the last 168 hours.  No results found for this or any previous visit (from the past 240 hours).   Radiology Studies: ECHOCARDIOGRAM COMPLETE Result Date: 09/03/2023    ECHOCARDIOGRAM REPORT   Patient Name:   TRANELL WOJTKIEWICZ Date of Exam: 09/03/2023 Medical Rec #:  [id number]      Height:       73.0 in Accession #:    [id number]     Weight:       224.8 lb Date of Birth:  10/24/1965      BSA:          2.261 m Patient Age:    57 years       BP:           133/74 mmHg Patient Gender: M              HR:           58 bpm. Exam Location:  Inpatient Procedure: 2D Echo, Cardiac Doppler, Color Doppler and Intracardiac            Opacification Agent (Both Spectral and Color Flow Doppler were            utilized during procedure). Indications:    NSTEMI  History:        Patient has no prior history of Echocardiogram examinations.                 Risk Factors:Hypertension.  Sonographer:    Karma Ganja Referring Phys: [id number] Angie Fava  Sonographer Comments: Technically difficult study due to poor echo windows. Global longitudinal strain was attempted. IMPRESSIONS  1. Left ventricular ejection fraction, by estimation, is 65 to 70%. Left ventricular ejection fraction by PLAX is 66 %. The left ventricle has normal function. The left ventricle has no regional wall motion abnormalities. There is mild left ventricular hypertrophy. Left ventricular diastolic parameters are consistent with Grade I diastolic dysfunction (impaired relaxation).  2. Right ventricular systolic function is normal. The right ventricular size is normal. Tricuspid regurgitation signal is inadequate for assessing PA pressure.  3. Left atrial size was moderately dilated.  4. The mitral valve is abnormal. Trivial mitral valve regurgitation.  5. The aortic valve is tricuspid. Aortic valve regurgitation is not visualized. Comparison(s): No prior Echocardiogram. FINDINGS  Left  Ventricle: Left ventricular ejection fraction, by estimation, is 65 to 70%. Left ventricular ejection fraction by PLAX is 66 %. The  left ventricle has normal function. The left ventricle has no regional wall motion abnormalities. Definity contrast agent was given IV to delineate the left ventricular endocardial borders. The left ventricular internal cavity size was normal in size. There is mild left ventricular hypertrophy. Left ventricular diastolic parameters are consistent with Grade I diastolic dysfunction (impaired relaxation). Indeterminate filling pressures. Right Ventricle: The right ventricular size is normal. No increase in right ventricular wall thickness. Right ventricular systolic function is normal. Tricuspid regurgitation signal is inadequate for assessing PA pressure. Left Atrium: Left atrial size was moderately dilated. Right Atrium: Right atrial size was normal in size. Pericardium: There is no evidence of pericardial effusion. Mitral Valve: The mitral valve is abnormal. Mild to moderate mitral annular calcification. Trivial mitral valve regurgitation. MV peak gradient, 5.7 mmHg. The mean mitral valve gradient is 2.0 mmHg. Tricuspid Valve: The tricuspid valve is not well visualized. Tricuspid valve regurgitation is not demonstrated. Aortic Valve: The aortic valve is tricuspid. Aortic valve regurgitation is not visualized. Aortic valve mean gradient measures 6.0 mmHg. Aortic valve peak gradient measures 10.8 mmHg. Aortic valve area, by VTI measures 2.46 cm. Pulmonic Valve: The pulmonic valve was normal in structure. Pulmonic valve regurgitation is not visualized. Aorta: The aortic root and ascending aorta are structurally normal, with no evidence of dilitation. Venous: The inferior vena cava was not well visualized. IAS/Shunts: No atrial level shunt detected by color flow Doppler.  LEFT VENTRICLE PLAX 2D LV EF:         Left            Diastology                ventricular     LV e' medial:    7.40  cm/s                ejection        LV E/e' medial:  13.5                fraction by     LV e' lateral:   6.85 cm/s                PLAX is 66      LV E/e' lateral: 14.6                %. LVIDd:         4.90 cm LVIDs:         3.10 cm LV PW:         1.20 cm LV IVS:        1.20 cm LVOT diam:     2.10 cm LV SV:         90 LV SV Index:   40 LVOT Area:     3.46 cm  RIGHT VENTRICLE RV Basal diam:  4.60 cm RV S prime:     13.90 cm/s TAPSE (M-mode): 2.8 cm LEFT ATRIUM            Index        RIGHT ATRIUM           Index LA diam:      4.50 cm  1.99 cm/m   RA Area:     19.00 cm LA Vol (A2C): 101.0 ml 44.67 ml/m  RA Volume:   52.40 ml  23.17 ml/m LA Vol (A4C): 80.3 ml  35.51 ml/m  AORTIC VALVE AV Area (Vmax):    2.34 cm AV Area (Vmean):   2.36 cm AV Area (  VTI):     2.46 cm AV Vmax:           164.00 cm/s AV Vmean:          111.000 cm/s AV VTI:            0.366 m AV Peak Grad:      10.8 mmHg AV Mean Grad:      6.0 mmHg LVOT Vmax:         111.00 cm/s LVOT Vmean:        75.600 cm/s LVOT VTI:          0.260 m LVOT/AV VTI ratio: 0.71  AORTA Ao Root diam: 3.60 cm MITRAL VALVE MV Area (PHT): 3.08 cm     SHUNTS MV Area VTI:   2.15 cm     Systemic VTI:  0.26 m MV Peak grad:  5.7 mmHg     Systemic Diam: 2.10 cm MV Mean grad:  2.0 mmHg MV Vmax:       1.19 m/s MV Vmean:      70.1 cm/s MV Decel Time: 246 msec MV E velocity: 100.00 cm/s MV A velocity: 112.00 cm/s MV E/A ratio:  0.89 Zoila Shutter MD Electronically signed by Zoila Shutter MD Signature Date/Time: 09/03/2023/2:10:07 PM    Final    DG Chest 2 View Result Date: 09/02/2023 CLINICAL DATA:  Chest pain EXAM: CHEST - 2 VIEW COMPARISON:  11/01/2020 FINDINGS: The heart size and mediastinal contours are within normal limits. Both lungs are clear. The visualized skeletal structures are unremarkable. IMPRESSION: No active cardiopulmonary disease. Electronically Signed   By: Sharlet Salina M.D.   On: 09/02/2023 21:23   Scheduled Meds:  amLODipine  5 mg Oral Daily   aspirin EC   81 mg Oral Daily   atorvastatin  40 mg Oral Daily   Chlorhexidine Gluconate Cloth  6 each Topical Q0600   heparin  3,000 Units Intravenous Once   metoprolol succinate  25 mg Oral Daily   polyethylene glycol  17 g Oral BID   senna-docusate  1 tablet Oral BID   Continuous Infusions:  heparin      LOS: 1 day   Marguerita Merles, DO [facility] Available via Epic secure chat 7am-7pm After these hours, please refer to coverage provider listed on [email or web address] 09/04/2023, 1:26 PM

## 2023-09-04 NOTE — Plan of Care (Signed)
   Problem: Education: Goal: Knowledge of General Education information will improve Description: Including pain rating scale, medication(s)/side effects and non-pharmacologic comfort measures Outcome: Progressing   Problem: Clinical Measurements: Goal: Cardiovascular complication will be avoided Outcome: Progressing

## 2023-09-05 DIAGNOSIS — I10 Essential (primary) hypertension: Secondary | ICD-10-CM | POA: Diagnosis not present

## 2023-09-05 DIAGNOSIS — R079 Chest pain, unspecified: Secondary | ICD-10-CM | POA: Diagnosis not present

## 2023-09-05 DIAGNOSIS — N186 End stage renal disease: Secondary | ICD-10-CM | POA: Diagnosis not present

## 2023-09-05 DIAGNOSIS — I214 Non-ST elevation (NSTEMI) myocardial infarction: Secondary | ICD-10-CM | POA: Diagnosis not present

## 2023-09-05 LAB — COMPREHENSIVE METABOLIC PANEL WITH GFR
ALT: 10 U/L (ref 0–44)
AST: 10 U/L — ABNORMAL LOW (ref 15–41)
Albumin: 3.3 g/dL — ABNORMAL LOW (ref 3.5–5.0)
Alkaline Phosphatase: 42 U/L (ref 38–126)
Anion gap: 12 (ref 5–15)
BUN: 44 mg/dL — ABNORMAL HIGH (ref 6–20)
CO2: 23 mmol/L (ref 22–32)
Calcium: 9.7 mg/dL (ref 8.9–10.3)
Chloride: 102 mmol/L (ref 98–111)
Creatinine, Ser: 11.28 mg/dL — ABNORMAL HIGH (ref 0.61–1.24)
GFR, Estimated: 5 mL/min — ABNORMAL LOW (ref >60)
Glucose, Bld: 93 mg/dL (ref 70–99)
Potassium: 4.3 mmol/L (ref 3.5–5.1)
Sodium: 137 mmol/L (ref 135–145)
Total Bilirubin: 0.5 mg/dL (ref 0.0–1.2)
Total Protein: 7.3 g/dL (ref 6.5–8.1)

## 2023-09-05 LAB — CBC
HCT: 34 % — ABNORMAL LOW (ref 39.0–52.0)
Hemoglobin: 11.1 g/dL — ABNORMAL LOW (ref 13.0–17.0)
MCH: 31.1 pg (ref 26.0–34.0)
MCHC: 32.6 g/dL (ref 30.0–36.0)
MCV: 95.2 fL (ref 80.0–100.0)
Platelets: 290 10*3/uL (ref 150–400)
RBC: 3.57 MIL/uL — ABNORMAL LOW (ref 4.22–5.81)
RDW: 13 % (ref 11.5–15.5)
WBC: 7.5 10*3/uL (ref 4.0–10.5)
nRBC: 0 % (ref 0.0–0.2)

## 2023-09-05 LAB — PHOSPHORUS: Phosphorus: 6.4 mg/dL — ABNORMAL HIGH (ref 2.5–4.6)

## 2023-09-05 LAB — IRON AND TIBC
Iron: 54 ug/dL (ref 45–182)
Saturation Ratios: 25 % (ref 17.9–39.5)
TIBC: 217 ug/dL — ABNORMAL LOW (ref 250–450)
UIBC: 163 ug/dL

## 2023-09-05 LAB — GLUCOSE, CAPILLARY: Glucose-Capillary: 84 mg/dL (ref 70–99)

## 2023-09-05 LAB — RETICULOCYTES
Immature Retic Fract: 13 % (ref 2.3–15.9)
RBC.: 3.59 MIL/uL — ABNORMAL LOW (ref 4.22–5.81)
Retic Count, Absolute: 34.5 10*3/uL (ref 19.0–186.0)
Retic Ct Pct: 1 % (ref 0.4–3.1)

## 2023-09-05 LAB — MAGNESIUM: Magnesium: 2.2 mg/dL (ref 1.7–2.4)

## 2023-09-05 LAB — HEPARIN LEVEL (UNFRACTIONATED): Heparin Unfractionated: 0.2 [IU]/mL — ABNORMAL LOW (ref 0.30–0.70)

## 2023-09-05 LAB — FOLATE: Folate: 8.3 ng/mL (ref >5.9)

## 2023-09-05 LAB — FERRITIN: Ferritin: 245 ng/mL (ref 24–336)

## 2023-09-05 LAB — VITAMIN B12: Vitamin B-12: 760 pg/mL (ref 180–914)

## 2023-09-05 MED ORDER — SENNOSIDES-DOCUSATE SODIUM 8.6-50 MG PO TABS: 1.0000 | ORAL_TABLET | Freq: Every day | ORAL | 0 refills | Status: AC

## 2023-09-05 MED ORDER — METOPROLOL SUCCINATE ER 25 MG PO TB24: 25.0000 mg | ORAL_TABLET | Freq: Every day | ORAL | 0 refills | Status: AC

## 2023-09-05 MED ORDER — NITROGLYCERIN 0.4 MG SL SUBL: 0.4000 mg | SUBLINGUAL_TABLET | SUBLINGUAL | 0 refills | Status: AC | PRN

## 2023-09-05 MED ORDER — ATORVASTATIN CALCIUM 40 MG PO TABS: 40.0000 mg | ORAL_TABLET | Freq: Every day | ORAL | 0 refills | Status: AC

## 2023-09-05 MED ORDER — ASPIRIN 81 MG PO TBEC: 81.0000 mg | DELAYED_RELEASE_TABLET | Freq: Every day | ORAL | 12 refills | Status: AC

## 2023-09-05 MED ORDER — POLYETHYLENE GLYCOL 3350 17 G PO PACK: 17.0000 g | PACK | Freq: Every day | ORAL | 0 refills | Status: AC | PRN

## 2023-09-05 NOTE — Discharge Summary (Signed)
 Physician Discharge Summary   Patient: Daniel Mccarty MRN: [id number] DOB: [birth year]  Admit date:     09/02/2023  Discharge date: 09/05/23  Discharge Physician: Marguerita Merles, DO   PCP: Patient, No Pcp Per   Recommendations at discharge:   Follow up within 1-2 weeks and repeat CBC, CMP, Mag Phos within 1 week Follow up with Cardiology within 1-2 weeks  Follow up with Nephrology within 1-2 weeks  Discharge Diagnoses: Principal Problem:   NSTEMI (non-ST elevated myocardial infarction) [facility]) Active Problems:   Essential hypertension   ESRD (end stage renal disease) (HCC)   Chest pain   Leukocytosis  Resolved Problems:   * No resolved hospital problems. [facility] Course: The patient is a 58 year old AAM with a past medical history significant for ESRD on home hemodialysis Monday, Tuesday, Thursday, Friday, essential hypertension, history of bladder outlet obstruction and BPH, and other comorbidities who presented to ED due to chest pain.  Further workup was done and suspicion is that he had an NSTEMI so cardiology was consulted and placed him on a heparin drip.  His echo was normal and reassuring and he is chest pain-free.  Given that he wants to avoid cardiac catheterization cardiology recommends treating him medically with IV heparin for 48 hours.  His heparin drip will finish on 09/05/2023.  He is medically stable for discharge at this time will need follow-up with PCP and nephrology outpatient setting as well as cardiology given his new NSTEMI  Assessment and Plan:  NSTEMI: Presenting with substernal Chest pressure non-radiating worse with inspiration. No real EKG changes noted. Denies SOB, Orthopnea. Troponin Level went from 300 -> 381 -> 618. Started on ASA 325 mg po Daily and changed to 81 mg po Daily, give a 1x dose of Atorvastatin 40 mg, and initiated on Heparin gtt. Also has NTG 0.4 mg SL q28min PRN CP and Ondansetron for N/V.  Cardiology was consulted and patient wanted to put.   Echocardiogram done that showed a normal EF of 60 to 65% but did show grade 1 diastolic dysfunction and left ventricular no regional wall motion abnormalities. Initial chest x-ray done showed no acute cardiopulmonary process.  Since he is chest pain-free and his echo is reassuring cardiology recommends treating with IV heparin and medically for 48 hours and needs to be continued until 09/05/2018 5 AM.   -Lipid panel done and showed a total cholesterol/HDL ratio 3.3, cholesterol 129, HDL 39, LDL 79, triglycerides of 56, VLDL of 11 and hemoglobin A1c was 4.8 -Recommending aspirin 81 mg daily, atorvastatin 40 g p.o. daily and metoprolol is being consolidated to 25 mg succinate daily.  Cardiology to be arranging outpatient follow-up for this patient and this is scheduled for 09/16/2023.  He is medically stable for discharge at this time.  Leukocytosis: WBC likely reactive in the setting of NSTEMI. No underlying infectious process at this time. WBC went from 12.6 -> 8.5 -> 7.0 and is now 7 point. CTM for S/Sx of Infection. Repeat CBC in the AM  Essential HTN: Initially amlodipine for now given NSTEMI.  He has now been consolidated to metoprolol succinate 25 mg p.o. daily.  Cardiology is adding his amlodipine back today.  CTM BP per Protocol. Last BP reading was 126/86  HypoNa+: Mild and imporived. Na+ is now 137. CTM and Trend and repeat CMP in the AM  ESRD on Home HD (MTThF) / Metabolic Acidosis: BUN/Cr Trend: Recent Labs  Lab 09/02/23 2039 09/03/23 0539 09/04/23 0339 09/05/23 0223  BUN  62* 65* 40* 44*  CREATININE 13.13* 13.65* 9.26* 11.28*  -MA has improved normalized CO2 is 25, anion gap is 12, chloride level was 99 -Avoid Nephrotoxic Medications, Contrast Dyes, Hypotension and Dehydration to Ensure Adequate Renal Perfusion and will need to Renally Adjust Meds -Continue to Monitor and Trend Renal Function carefully and repeat CMP in the AM  -Consult Nephrology for maintenance of HD  Normocytic  Anemia/Anemia of Chronic Kidney Disease: Hgb/Hct went from 11.5/35.5 -> 10.4/31.3 -> 10.9/33.0 and is now 11.1/34.0. Anemia Panel done and showed an iron level of 54, UIBC of 163, TIBC of 217, saturation of 25%, ferritin level of 245.4 normal 8.3 and vitamin B12 760. CTM for S/Sx of Bleeding; No overt bleeding noted. Repeat CBC in the AM   Constipation: Bowel regimen with MiraLAX 17 g p.o. twice daily, senna docusate 1 tab p.o. twice daily and provide bisacodyl suppository 10 mg RC daily as needed  Hypoalbuminemia: Patient's Albumin is now 3.3. CTM and Trend and repeat CMP in the AM  Overweight: Complicates overall prognosis and care. Estimated body mass index is 31.76 kg/m as calculated from the following:   Height as of this encounter: 6\' 1"  (1.854 m).   Weight as of this encounter: 109.2 kg. Weight Loss and Dietary Counseling given  Consultants: Cardiology, nephrology Procedures performed: As delineated as above  Disposition: Home Diet recommendation:  Discharge Diet Orders (From admission, onward)     Start     Ordered   09/05/23 0000  Diet - low sodium heart healthy        09/05/23 1054           Renal diet DISCHARGE MEDICATION: Allergies as of 09/05/2023   No Known Allergies      Medication List     TAKE these medications    amLODipine 5 MG tablet Commonly known as: NORVASC Take 1 tablet (5 mg total) by mouth daily.   aspirin EC 81 MG tablet Take 1 tablet (81 mg total) by mouth daily. Swallow whole.   ASTRAGALUS ROOT PO Take 9,000 mg by mouth daily. Carlyle astragalus root capsules; 4500mg  per capsule   atorvastatin 40 MG tablet Commonly known as: LIPITOR Take 1 tablet (40 mg total) by mouth daily.   BLACK CURRANT SEED OIL PO Take 4,000 mg by mouth daily.   CHOLECALCIFEROL PO Take 1 capsule by mouth daily.   metoprolol succinate 25 MG 24 hr tablet Commonly known as: TOPROL-XL Take 1 tablet (25 mg total) by mouth daily.   nitroGLYCERIN 0.4 MG SL  tablet Commonly known as: NITROSTAT Place 1 tablet (0.4 mg total) under the tongue every 5 (five) minutes as needed for chest pain.   polyethylene glycol 17 g packet Commonly known as: MIRALAX / GLYCOLAX Take 17 g by mouth daily as needed.   senna-docusate 8.6-50 MG tablet Commonly known as: Senokot-S Take 1 tablet by mouth at bedtime.   Sensipar 60 MG tablet Generic drug: cinacalcet Take 60 mg by mouth daily. Take one (60mg ) tablet by mouth daily.   sevelamer carbonate 800 MG tablet Commonly known as: RENVELA Take 2,400 mg by mouth with breakfast, with lunch, and with evening meal. Take three (800mg ) tablets by mouth three time per day with meals and take one (800mg ) tablet with snacks.        Follow-up Information     Reather Littler D, NP Follow up.   Specialty: Cardiology Why: [facility] location - cardiology follow-up on Wednesday Sep 16, 2023 10:05  AM. Arrive 15 minutes prior to appointment to check in. Contact information: [address] [place] [address] [phone]                Discharge Exam: Ceasar Mons Weights   09/03/23 1318 09/04/23 0435 09/05/23 0619  Weight: 109 kg 110.4 kg 109.2 kg   Vitals:   09/05/23 0858 09/05/23 0909  BP: 130/80 130/80  Pulse: (!) 59 (!) 59  Resp: 16   Temp: 97.9 F (36.6 C)   SpO2: 97%    Examination: Physical Exam:  Constitutional: WN/WD obese African-American male in no acute distress Respiratory: Diminished to auscultation bilaterally, no wheezing, rales, rhonchi or crackles. Normal respiratory effort and patient is not tachypenic. No accessory muscle use.  Unlabored breathing Cardiovascular: RRR, no murmurs / rubs / gallops. S1 and S2 auscultated.  Mild extremity edema Abdomen: Soft, non-tender, distended secondary body habitus. Bowel sounds positive.  GU: Deferred. Musculoskeletal: No clubbing / cyanosis of digits/nails. No joint deformity upper and lower extremities. Skin: No  rashes, lesions, ulcers on limited skin evaluation. No induration; Warm and dry.  Neurologic: CN 2-12 grossly intact with no focal deficits. Romberg sign and cerebellar reflexes not assessed.  Psychiatric: Normal judgment and insight. Alert and oriented x 3. Normal mood and appropriate affect.   Condition at discharge: stable  The results of significant diagnostics from this hospitalization (including imaging, microbiology, ancillary and laboratory) are listed below for reference.   Imaging Studies: ECHOCARDIOGRAM COMPLETE Result Date: 09/03/2023    ECHOCARDIOGRAM REPORT   Patient Name:   Daniel Mccarty Date of Exam: 09/03/2023 Medical Rec #:  [id number]      Height:       73.0 in Accession #:    [id number]     Weight:       224.8 lb Date of Birth:  [birth year]      BSA:          2.261 m Patient Age:    57 years       BP:           133/74 mmHg Patient Gender: M              HR:           58 bpm. Exam Location:  Inpatient Procedure: 2D Echo, Cardiac Doppler, Color Doppler and Intracardiac            Opacification Agent (Both Spectral and Color Flow Doppler were            utilized during procedure). Indications:    NSTEMI  History:        Patient has no prior history of Echocardiogram examinations.                 Risk Factors:Hypertension.  Sonographer:    Karma Ganja Referring Phys: [id number] Angie Fava  Sonographer Comments: Technically difficult study due to poor echo windows. Global longitudinal strain was attempted. IMPRESSIONS  1. Left ventricular ejection fraction, by estimation, is 65 to 70%. Left ventricular ejection fraction by PLAX is 66 %. The left ventricle has normal function. The left ventricle has no regional wall motion abnormalities. There is mild left ventricular hypertrophy. Left ventricular diastolic parameters are consistent with Grade I diastolic dysfunction (impaired relaxation).  2. Right ventricular systolic function is normal. The right ventricular size is normal. Tricuspid  regurgitation signal is inadequate for assessing PA pressure.  3. Left atrial size was moderately dilated.  4. The mitral valve is abnormal.  Trivial mitral valve regurgitation.  5. The aortic valve is tricuspid. Aortic valve regurgitation is not visualized. Comparison(s): No prior Echocardiogram. FINDINGS  Left Ventricle: Left ventricular ejection fraction, by estimation, is 65 to 70%. Left ventricular ejection fraction by PLAX is 66 %. The left ventricle has normal function. The left ventricle has no regional wall motion abnormalities. Definity contrast agent was given IV to delineate the left ventricular endocardial borders. The left ventricular internal cavity size was normal in size. There is mild left ventricular hypertrophy. Left ventricular diastolic parameters are consistent with Grade I diastolic dysfunction (impaired relaxation). Indeterminate filling pressures. Right Ventricle: The right ventricular size is normal. No increase in right ventricular wall thickness. Right ventricular systolic function is normal. Tricuspid regurgitation signal is inadequate for assessing PA pressure. Left Atrium: Left atrial size was moderately dilated. Right Atrium: Right atrial size was normal in size. Pericardium: There is no evidence of pericardial effusion. Mitral Valve: The mitral valve is abnormal. Mild to moderate mitral annular calcification. Trivial mitral valve regurgitation. MV peak gradient, 5.7 mmHg. The mean mitral valve gradient is 2.0 mmHg. Tricuspid Valve: The tricuspid valve is not well visualized. Tricuspid valve regurgitation is not demonstrated. Aortic Valve: The aortic valve is tricuspid. Aortic valve regurgitation is not visualized. Aortic valve mean gradient measures 6.0 mmHg. Aortic valve peak gradient measures 10.8 mmHg. Aortic valve area, by VTI measures 2.46 cm. Pulmonic Valve: The pulmonic valve was normal in structure. Pulmonic valve regurgitation is not visualized. Aorta: The aortic root and  ascending aorta are structurally normal, with no evidence of dilitation. Venous: The inferior vena cava was not well visualized. IAS/Shunts: No atrial level shunt detected by color flow Doppler.  LEFT VENTRICLE PLAX 2D LV EF:         Left            Diastology                ventricular     LV e' medial:    7.40 cm/s                ejection        LV E/e' medial:  13.5                fraction by     LV e' lateral:   6.85 cm/s                PLAX is 66      LV E/e' lateral: 14.6                %. LVIDd:         4.90 cm LVIDs:         3.10 cm LV PW:         1.20 cm LV IVS:        1.20 cm LVOT diam:     2.10 cm LV SV:         90 LV SV Index:   40 LVOT Area:     3.46 cm  RIGHT VENTRICLE RV Basal diam:  4.60 cm RV S prime:     13.90 cm/s TAPSE (M-mode): 2.8 cm LEFT ATRIUM            Index        RIGHT ATRIUM           Index LA diam:      4.50 cm  1.99 cm/m   RA Area:     19.00 cm  LA Vol (A2C): 101.0 ml 44.67 ml/m  RA Volume:   52.40 ml  23.17 ml/m LA Vol (A4C): 80.3 ml  35.51 ml/m  AORTIC VALVE AV Area (Vmax):    2.34 cm AV Area (Vmean):   2.36 cm AV Area (VTI):     2.46 cm AV Vmax:           164.00 cm/s AV Vmean:          111.000 cm/s AV VTI:            0.366 m AV Peak Grad:      10.8 mmHg AV Mean Grad:      6.0 mmHg LVOT Vmax:         111.00 cm/s LVOT Vmean:        75.600 cm/s LVOT VTI:          0.260 m LVOT/AV VTI ratio: 0.71  AORTA Ao Root diam: 3.60 cm MITRAL VALVE MV Area (PHT): 3.08 cm     SHUNTS MV Area VTI:   2.15 cm     Systemic VTI:  0.26 m MV Peak grad:  5.7 mmHg     Systemic Diam: 2.10 cm MV Mean grad:  2.0 mmHg MV Vmax:       1.19 m/s MV Vmean:      70.1 cm/s MV Decel Time: 246 msec MV E velocity: 100.00 cm/s MV A velocity: 112.00 cm/s MV E/A ratio:  0.89 Zoila Shutter MD Electronically signed by Zoila Shutter MD Signature Date/Time: 09/03/2023/2:10:07 PM    Final    DG Chest 2 View Result Date: 09/02/2023 CLINICAL DATA:  Chest pain EXAM: CHEST - 2 VIEW COMPARISON:  11/01/2020 FINDINGS: The heart  size and mediastinal contours are within normal limits. Both lungs are clear. The visualized skeletal structures are unremarkable. IMPRESSION: No active cardiopulmonary disease. Electronically Signed   By: Sharlet Salina M.D.   On: 09/02/2023 21:23   Microbiology: Results for orders placed or performed during the hospital encounter of 11/01/20  Culture, Urine     Status: None   Collection Time: 11/01/20  8:05 PM   Specimen: Urine, Random  Result Value Ref Range Status   Specimen Description URINE, RANDOM  Final   Special Requests NONE  Final   Culture   Final    NO GROWTH Performed at [facility], [address], [place], [place] [address]    Report Status 11/03/2020 FINAL  Final  Resp Panel by RT-PCR (Flu A&B, Covid) Nasopharyngeal Swab     Status: None   Collection Time: 11/01/20  8:24 PM   Specimen: Nasopharyngeal Swab; Nasopharyngeal(NP) swabs in vial transport medium  Result Value Ref Range Status   SARS Coronavirus 2 by RT PCR NEGATIVE NEGATIVE Final    Comment: (NOTE) SARS-CoV-2 target nucleic acids are NOT DETECTED.  The SARS-CoV-2 RNA is generally detectable in upper respiratory specimens during the acute phase of infection. The lowest concentration of SARS-CoV-2 viral copies this assay can detect is 138 copies/mL. A negative result does not preclude SARS-Cov-2 infection and should not be used as the sole basis for treatment or other patient management decisions. A negative result may occur with  improper specimen collection/handling, submission of specimen other than nasopharyngeal swab, presence of viral mutation(s) within the areas targeted by this assay, and inadequate number of viral copies(<138 copies/mL). A negative result must be combined with clinical observations, patient history, and epidemiological information. The expected result is Negative.  Fact Sheet for Patients:  BloggerCourse.com  Fact Sheet for Healthcare Providers:   SeriousBroker.it  This test is no t yet approved or cleared by the [place] FDA and  has been authorized for detection and/or diagnosis of SARS-CoV-2 by FDA under an Emergency Use Authorization (EUA). This EUA will remain  in effect (meaning this test can be used) for the duration of the COVID-19 declaration under Section 564(b)(1) of the Act, 21 U.S.C.section 360bbb-3(b)(1), unless the authorization is terminated  or revoked sooner.       Influenza A by PCR NEGATIVE NEGATIVE Final   Influenza B by PCR NEGATIVE NEGATIVE Final    Comment: (NOTE) The Xpert Xpress SARS-CoV-2/FLU/RSV plus assay is intended as an aid in the diagnosis of influenza from Nasopharyngeal swab specimens and should not be used as a sole basis for treatment. Nasal washings and aspirates are unacceptable for Xpert Xpress SARS-CoV-2/FLU/RSV testing.  Fact Sheet for Patients: BloggerCourse.com  Fact Sheet for Healthcare Providers: SeriousBroker.it  This test is not yet approved or cleared by the [place] FDA and has been authorized for detection and/or diagnosis of SARS-CoV-2 by FDA under an Emergency Use Authorization (EUA). This EUA will remain in effect (meaning this test can be used) for the duration of the COVID-19 declaration under Section 564(b)(1) of the Act, 21 U.S.C. section 360bbb-3(b)(1), unless the authorization is terminated or revoked.  Performed at [facility], [address], [place], [place] [address]    Labs: CBC: Recent Labs  Lab 09/02/23 2039 09/03/23 0539 09/04/23 0339 09/05/23 0228  WBC 12.6* 8.5 7.0 7.5  NEUTROABS  --   --  4.9  --   HGB 11.5* 10.4* 10.9* 11.1*  HCT 35.5* 31.3* 33.0* 34.0*  MCV 94.9 93.7 92.7 95.2  PLT 311 265 267 290   Basic Metabolic Panel: Recent Labs  Lab 09/02/23 2039 09/03/23 0539 09/04/23 0339 09/05/23 0223  NA 134* 134* 136 137  K 4.6 4.2 4.0 4.3   CL 97* 100 99 102  CO2 20* 20* 25 23  GLUCOSE 110* 95 117* 93  BUN 62* 65* 40* 44*  CREATININE 13.13* 13.65* 9.26* 11.28*  CALCIUM 10.4* 9.7 9.4 9.7  MG  --  2.1 2.1 2.2  PHOS  --  7.5* 5.2* 6.4*   Liver Function Tests: Recent Labs  Lab 09/03/23 0539 09/04/23 0339 09/05/23 0223  AST 13* 11* 10*  ALT 12 11 10   ALKPHOS 44 41 42  BILITOT 0.7 0.5 0.5  PROT 7.4 7.3 7.3  ALBUMIN 3.4* 3.3* 3.3*   CBG: Recent Labs  Lab 09/04/23 0543 09/04/23 1133 09/04/23 2050 09/05/23 0616  GLUCAP 110* 135* 100* 84   Discharge time spent: greater than 30 minutes.  Signed: Merlene Laughter, DO [facility] 09/08/2023

## 2023-09-05 NOTE — Progress Notes (Signed)
 Subjective: Open room getting dressed for discharge, denies shortness of breath or chest pain.  He states he will do HD at home today  Objective Vital signs in last 24 hours: Vitals:   09/04/23 2357 09/05/23 0619 09/05/23 0858 09/05/23 0909  BP: 139/84 125/75 130/80 130/80  Pulse: 62  (!) 59 (!) 59  Resp: 16 16 16    Temp: 98.1 F (36.7 C) 97.9 F (36.6 C) 97.9 F (36.6 C)   TempSrc: Oral Oral Oral   SpO2: 99% 99% 97%   Weight:  109.2 kg    Height:       Weight change: 0.2 kg  Physical Exam: General: alert NAD Heart: RRR No MRG Lungs: CTA nonlabored RA Abdomen: NABS, Soft, NT, ND Extremities: No Pedal edema Dialysis Access: LFA AVF  + bruit        OP HD:  HHD  [facility]  Dr Marisue Humble 3h   B400  109.5kg    AVF   Heparin 4000 Last HD 4/1, post wt 109.7kg Mircera 50 mcg sq q 4 wks, last 4/2 Last Hb 11.8     Renal-related home meds: Sensipar 60 every day Renvela 3 ac tid Norvasc 5 qd     Problem/Plan:  Chest pain - possible NSTEMI, +trops. ECHO showed no new WM changes. No plan for cath. Medical Rx.  ESRD: on HHD 4x/week. HD yest  3h . , next hd would be tomor but at home.  Ok for [employer] home per Renal standpoint  HTN: cont norvasc Volume: no vol overload on exam. CXR clear.  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   Lenny Pastel, PA-C [facility] Beeper [phone] 09/05/2023,11:33 AM  LOS: 2 days   Labs: Basic Metabolic Panel: Recent Labs  Lab 09/03/23 0539 09/04/23 0339 09/05/23 0223  NA 134* 136 137  K 4.2 4.0 4.3  CL 100 99 102  CO2 20* 25 23  GLUCOSE 95 117* 93  BUN 65* 40* 44*  CREATININE 13.65* 9.26* 11.28*  CALCIUM 9.7 9.4 9.7  PHOS 7.5* 5.2* 6.4*   Liver Function Tests: Recent Labs  Lab 09/03/23 0539 09/04/23 0339 09/05/23 0223  AST 13* 11* 10*  ALT 12 11 10   ALKPHOS 44 41 42  BILITOT 0.7 0.5 0.5  PROT 7.4 7.3 7.3  ALBUMIN  3.4* 3.3* 3.3*   No results for input(s): "LIPASE", "AMYLASE" in the last 168 hours. No results for input(s): "AMMONIA" in the last 168 hours. CBC: Recent Labs  Lab 09/02/23 2039 09/03/23 0539 09/04/23 0339 09/05/23 0228  WBC 12.6* 8.5 7.0 7.5  NEUTROABS  --   --  4.9  --   HGB 11.5* 10.4* 10.9* 11.1*  HCT 35.5* 31.3* 33.0* 34.0*  MCV 94.9 93.7 92.7 95.2  PLT 311 265 267 290   Cardiac Enzymes: No results for input(s): "CKTOTAL", "CKMB", "CKMBINDEX", "TROPONINI" in the last 168 hours. CBG: Recent Labs  Lab 09/04/23 0543 09/04/23 1133 09/04/23 2050 09/05/23 0616  GLUCAP 110* 135* 100* 84    Studies/Results: ECHOCARDIOGRAM COMPLETE Result Date: 09/03/2023    ECHOCARDIOGRAM REPORT   Patient Name:   Daniel Mccarty Date of Exam: 09/03/2023 Medical Rec #:  [id number]      Height:       73.0 in Accession #:    [id number]     Weight:       224.8 lb Date of Birth:  1966/05/07  BSA:          2.261 m Patient Age:    57 years       BP:           133/74 mmHg Patient Gender: M              HR:           58 bpm. Exam Location:  Inpatient Procedure: 2D Echo, Cardiac Doppler, Color Doppler and Intracardiac            Opacification Agent (Both Spectral and Color Flow Doppler were            utilized during procedure). Indications:    NSTEMI  History:        Patient has no prior history of Echocardiogram examinations.                 Risk Factors:Hypertension.  Sonographer:    Karma Ganja Referring Phys: [id number] Angie Fava  Sonographer Comments: Technically difficult study due to poor echo windows. Global longitudinal strain was attempted. IMPRESSIONS  1. Left ventricular ejection fraction, by estimation, is 65 to 70%. Left ventricular ejection fraction by PLAX is 66 %. The left ventricle has normal function. The left ventricle has no regional wall motion abnormalities. There is mild left ventricular hypertrophy. Left ventricular diastolic parameters are consistent with Grade I diastolic  dysfunction (impaired relaxation).  2. Right ventricular systolic function is normal. The right ventricular size is normal. Tricuspid regurgitation signal is inadequate for assessing PA pressure.  3. Left atrial size was moderately dilated.  4. The mitral valve is abnormal. Trivial mitral valve regurgitation.  5. The aortic valve is tricuspid. Aortic valve regurgitation is not visualized. Comparison(s): No prior Echocardiogram. FINDINGS  Left Ventricle: Left ventricular ejection fraction, by estimation, is 65 to 70%. Left ventricular ejection fraction by PLAX is 66 %. The left ventricle has normal function. The left ventricle has no regional wall motion abnormalities. Definity contrast agent was given IV to delineate the left ventricular endocardial borders. The left ventricular internal cavity size was normal in size. There is mild left ventricular hypertrophy. Left ventricular diastolic parameters are consistent with Grade I diastolic dysfunction (impaired relaxation). Indeterminate filling pressures. Right Ventricle: The right ventricular size is normal. No increase in right ventricular wall thickness. Right ventricular systolic function is normal. Tricuspid regurgitation signal is inadequate for assessing PA pressure. Left Atrium: Left atrial size was moderately dilated. Right Atrium: Right atrial size was normal in size. Pericardium: There is no evidence of pericardial effusion. Mitral Valve: The mitral valve is abnormal. Mild to moderate mitral annular calcification. Trivial mitral valve regurgitation. MV peak gradient, 5.7 mmHg. The mean mitral valve gradient is 2.0 mmHg. Tricuspid Valve: The tricuspid valve is not well visualized. Tricuspid valve regurgitation is not demonstrated. Aortic Valve: The aortic valve is tricuspid. Aortic valve regurgitation is not visualized. Aortic valve mean gradient measures 6.0 mmHg. Aortic valve peak gradient measures 10.8 mmHg. Aortic valve area, by VTI measures 2.46 cm.  Pulmonic Valve: The pulmonic valve was normal in structure. Pulmonic valve regurgitation is not visualized. Aorta: The aortic root and ascending aorta are structurally normal, with no evidence of dilitation. Venous: The inferior vena cava was not well visualized. IAS/Shunts: No atrial level shunt detected by color flow Doppler.  LEFT VENTRICLE PLAX 2D LV EF:         Left            Diastology  ventricular     LV e' medial:    7.40 cm/s                ejection        LV E/e' medial:  13.5                fraction by     LV e' lateral:   6.85 cm/s                PLAX is 66      LV E/e' lateral: 14.6                %. LVIDd:         4.90 cm LVIDs:         3.10 cm LV PW:         1.20 cm LV IVS:        1.20 cm LVOT diam:     2.10 cm LV SV:         90 LV SV Index:   40 LVOT Area:     3.46 cm  RIGHT VENTRICLE RV Basal diam:  4.60 cm RV S prime:     13.90 cm/s TAPSE (M-mode): 2.8 cm LEFT ATRIUM            Index        RIGHT ATRIUM           Index LA diam:      4.50 cm  1.99 cm/m   RA Area:     19.00 cm LA Vol (A2C): 101.0 ml 44.67 ml/m  RA Volume:   52.40 ml  23.17 ml/m LA Vol (A4C): 80.3 ml  35.51 ml/m  AORTIC VALVE AV Area (Vmax):    2.34 cm AV Area (Vmean):   2.36 cm AV Area (VTI):     2.46 cm AV Vmax:           164.00 cm/s AV Vmean:          111.000 cm/s AV VTI:            0.366 m AV Peak Grad:      10.8 mmHg AV Mean Grad:      6.0 mmHg LVOT Vmax:         111.00 cm/s LVOT Vmean:        75.600 cm/s LVOT VTI:          0.260 m LVOT/AV VTI ratio: 0.71  AORTA Ao Root diam: 3.60 cm MITRAL VALVE MV Area (PHT): 3.08 cm     SHUNTS MV Area VTI:   2.15 cm     Systemic VTI:  0.26 m MV Peak grad:  5.7 mmHg     Systemic Diam: 2.10 cm MV Mean grad:  2.0 mmHg MV Vmax:       1.19 m/s MV Vmean:      70.1 cm/s MV Decel Time: 246 msec MV E velocity: 100.00 cm/s MV A velocity: 112.00 cm/s MV E/A ratio:  0.89 Zoila Shutter MD Electronically signed by Zoila Shutter MD Signature Date/Time: 09/03/2023/2:10:07 PM    Final     Medications:   amLODipine  5 mg Oral Daily   aspirin EC  81 mg Oral Daily   atorvastatin  40 mg Oral Daily   Chlorhexidine Gluconate Cloth  6 each Topical Q0600   metoprolol succinate  25 mg Oral Daily   polyethylene glycol  17 g Oral BID   senna-docusate  1 tablet Oral BID

## 2023-09-05 NOTE — Progress Notes (Signed)
 PHARMACY - ANTICOAGULATION Pharmacy Consult for heparin Indication: chest pain/ACS  No Known Allergies  Patient Measurements: Height: 6\' 1"  (185.4 cm) Weight: 110.4 kg (243 lb 6.2 oz) IBW/kg (Calculated) : 79.9 HEPARIN DW (KG): 102.6  Vital Signs: Temp: 98.1 F (36.7 C) (04/04 2357) Temp Source: Oral (04/04 2357) BP: 139/84 (04/04 2357) Pulse Rate: 62 (04/04 2357)  Labs: Recent Labs    09/02/23 2039 09/02/23 2310 09/03/23 0539 09/03/23 1000 09/04/23 0339 09/04/23 1157 09/04/23 2349  HGB 11.5*  --  10.4*  --  10.9*  --   --   HCT 35.5*  --  31.3*  --  33.0*  --   --   PLT 311  --  265  --  267  --   --   HEPARINUNFRC  --   --   --    < > <0.10* 0.13* 0.20*  CREATININE 13.13*  --  13.65*  --  9.26*  --   --   TROPONINIHS 300* 381* 618*  --   --   --   --    < > = values in this interval not displayed.    Estimated Creatinine Clearance: 11.6 mL/min (A) (by C-G formula based on SCr of 9.26 mg/dL (H)).  Assessment: 58 y.o. male with chest pain for heparin. Heparin level came back subtherapeutic again. Plan to continue until tomorrow AM.  Hgb 10s, plt wnl  4/5 AM update:  Heparin level sub-therapeutic   Goal of Therapy:  Heparin level 0.3-0.7 units/ml Monitor platelets by anticoagulation protocol: Yes   Plan:  Inc heparin to 2000 units/hr Heparin off 4/5 AM (48 hours treatment time)  Abran Duke, PharmD, BCPS Clinical Pharmacist Phone: [phone]

## 2023-09-05 NOTE — Plan of Care (Signed)

## 2023-09-07 NOTE — Progress Notes (Signed)
 Late Note Entry- September 07, 2023  Pt d/c on Saturday. Contacted [facility] home therapy dept to be advised of pt's date and that pt should resume home HD at d/c.   Olivia Canter Renal Navigator [phone]

## 2023-09-14 NOTE — Progress Notes (Deleted)
 Cardiology Office Note    Date:  09/14/2023  ID:  Daniel Mccarty, DOB 1965/08/31, MRN [id number] PCP:  Patient, No Pcp Per  Cardiologist:  Jodelle Red, MD  Electrophysiologist:  None   Chief Complaint: ***  History of Present Illness: Marland Kitchen    Daniel Mccarty is a 58 y.o. male with visit-pertinent history of ***  On/3/25 patient presented to the ED for evaluation of substernal chest pain.  Per description was made worse and became more aware of discomfort with deep inspiration.  Patient had slow improvement in pain following administration of aspirin and sublingual nitroglycerin x 1. Troponin [id number].  ECG without acute ischemia.  Patient denied any chest pain with physical activity or exertional limitation prior to presentation.  Echocardiogram on 09/03/23 indicated LVEF of 65 to 70%, no RWMA, mild LVH, grade 1 diastolic dysfunction.  Echocardiogram result reviewed with patient by Dr. Cristal Deer, he denied any recurrent chest pain.  Given reassuring results patient elected to proceed with medical management and not undergo cardiac catheterization.  Patient treated with IV heparin for 48 hours for medical management, continued atorvastatin 40 mg daily, aspirin 81 mg daily and consolidated metoprolol succinate 25 mg daily.  Patient's amlodipine was restarted.  Today he presents for follow up. He reports that he    CAD/NSTEMI: Patient presented on/3/25 with substernal chest pain. Troponin [id number].  ECG without acute ischemia. Echocardiogram on 09/03/23 indicated LVEF of 65 to 70%, no RWMA, mild LVH, grade 1 diastolic dysfunction.  Given reassuring echocardiogram results and no recurrence of chest discomfort patient deferred cardiac catheterization in favor of medical management. Today he reports that he Hypertension: Blood pressure today  ESRD: On HD Hyperlipidemia: Last lipid profile on 09/30/2023 indicated total cholesterol 129, HDL 39, triglycerides 56 and LDL 79.  Labwork  independently reviewed:   ROS: .   *** denies chest pain, shortness of breath, lower extremity edema, fatigue, palpitations, melena, hematuria, hemoptysis, diaphoresis, weakness, presyncope, syncope, orthopnea, and PND.  All other systems are reviewed and otherwise negative.  Studies Reviewed: Marland Kitchen    EKG:  EKG is ordered today, personally reviewed, demonstrating ***     CV Studies: Cardiac studies reviewed are outlined and summarized above. Otherwise please see EMR for full report. Cardiac Studies & Procedures   ______________________________________________________________________________________________     ECHOCARDIOGRAM  ECHOCARDIOGRAM COMPLETE 09/03/2023  Narrative ECHOCARDIOGRAM REPORT    Patient Name:   Daniel Mccarty Date of Exam: 09/03/2023 Medical Rec #:  [id number]      Height:       73.0 in Accession #:    [id number]     Weight:       224.8 lb Date of Birth:  1965-06-06      BSA:          2.261 m Patient Age:    57 years       BP:           133/74 mmHg Patient Gender: M              HR:           58 bpm. Exam Location:  Inpatient  Procedure: 2D Echo, Cardiac Doppler, Color Doppler and Intracardiac Opacification Agent (Both Spectral and Color Flow Doppler were utilized during procedure).  Indications:    NSTEMI  History:        Patient has no prior history of Echocardiogram examinations. Risk Factors:Hypertension.  Sonographer:    Karma Ganja Referring Phys: [id number] Angie Fava   Sonographer  Comments: Technically difficult study due to poor echo windows. Global longitudinal strain was attempted. IMPRESSIONS   1. Left ventricular ejection fraction, by estimation, is 65 to 70%. Left ventricular ejection fraction by PLAX is 66 %. The left ventricle has normal function. The left ventricle has no regional wall motion abnormalities. There is mild left ventricular hypertrophy. Left ventricular diastolic parameters are consistent with Grade I diastolic  dysfunction (impaired relaxation). 2. Right ventricular systolic function is normal. The right ventricular size is normal. Tricuspid regurgitation signal is inadequate for assessing PA pressure. 3. Left atrial size was moderately dilated. 4. The mitral valve is abnormal. Trivial mitral valve regurgitation. 5. The aortic valve is tricuspid. Aortic valve regurgitation is not visualized.  Comparison(s): No prior Echocardiogram.  FINDINGS Left Ventricle: Left ventricular ejection fraction, by estimation, is 65 to 70%. Left ventricular ejection fraction by PLAX is 66 %. The left ventricle has normal function. The left ventricle has no regional wall motion abnormalities. Definity contrast agent was given IV to delineate the left ventricular endocardial borders. The left ventricular internal cavity size was normal in size. There is mild left ventricular hypertrophy. Left ventricular diastolic parameters are consistent with Grade I diastolic dysfunction (impaired relaxation). Indeterminate filling pressures.  Right Ventricle: The right ventricular size is normal. No increase in right ventricular wall thickness. Right ventricular systolic function is normal. Tricuspid regurgitation signal is inadequate for assessing PA pressure.  Left Atrium: Left atrial size was moderately dilated.  Right Atrium: Right atrial size was normal in size.  Pericardium: There is no evidence of pericardial effusion.  Mitral Valve: The mitral valve is abnormal. Mild to moderate mitral annular calcification. Trivial mitral valve regurgitation. MV peak gradient, 5.7 mmHg. The mean mitral valve gradient is 2.0 mmHg.  Tricuspid Valve: The tricuspid valve is not well visualized. Tricuspid valve regurgitation is not demonstrated.  Aortic Valve: The aortic valve is tricuspid. Aortic valve regurgitation is not visualized. Aortic valve mean gradient measures 6.0 mmHg. Aortic valve peak gradient measures 10.8 mmHg. Aortic valve area,  by VTI measures 2.46 cm.  Pulmonic Valve: The pulmonic valve was normal in structure. Pulmonic valve regurgitation is not visualized.  Aorta: The aortic root and ascending aorta are structurally normal, with no evidence of dilitation.  Venous: The inferior vena cava was not well visualized.  IAS/Shunts: No atrial level shunt detected by color flow Doppler.   LEFT VENTRICLE PLAX 2D LV EF:         Left            Diastology ventricular     LV e' medial:    7.40 cm/s ejection        LV E/e' medial:  13.5 fraction by     LV e' lateral:   6.85 cm/s PLAX is 66      LV E/e' lateral: 14.6 %. LVIDd:         4.90 cm LVIDs:         3.10 cm LV PW:         1.20 cm LV IVS:        1.20 cm LVOT diam:     2.10 cm LV SV:         90 LV SV Index:   40 LVOT Area:     3.46 cm   RIGHT VENTRICLE RV Basal diam:  4.60 cm RV S prime:     13.90 cm/s TAPSE (M-mode): 2.8 cm  LEFT ATRIUM  Index        RIGHT ATRIUM           Index LA diam:      4.50 cm  1.99 cm/m   RA Area:     19.00 cm LA Vol (A2C): 101.0 ml 44.67 ml/m  RA Volume:   52.40 ml  23.17 ml/m LA Vol (A4C): 80.3 ml  35.51 ml/m AORTIC VALVE AV Area (Vmax):    2.34 cm AV Area (Vmean):   2.36 cm AV Area (VTI):     2.46 cm AV Vmax:           164.00 cm/s AV Vmean:          111.000 cm/s AV VTI:            0.366 m AV Peak Grad:      10.8 mmHg AV Mean Grad:      6.0 mmHg LVOT Vmax:         111.00 cm/s LVOT Vmean:        75.600 cm/s LVOT VTI:          0.260 m LVOT/AV VTI ratio: 0.71  AORTA Ao Root diam: 3.60 cm  MITRAL VALVE MV Area (PHT): 3.08 cm     SHUNTS MV Area VTI:   2.15 cm     Systemic VTI:  0.26 m MV Peak grad:  5.7 mmHg     Systemic Diam: 2.10 cm MV Mean grad:  2.0 mmHg MV Vmax:       1.19 m/s MV Vmean:      70.1 cm/s MV Decel Time: 246 msec MV E velocity: 100.00 cm/s MV A velocity: 112.00 cm/s MV E/A ratio:  0.89  Dinah Franco MD Electronically signed by Dinah Franco MD Signature Date/Time:  09/03/2023/2:10:07 PM    Final          ______________________________________________________________________________________________       Current Reported Medications:.    No outpatient medications have been marked as taking for the 09/16/23 encounter (Appointment) with Ayomikun Starling D, NP.    Physical Exam:    VS:  There were no vitals taken for this visit.   Wt Readings from Last 3 Encounters:  09/05/23 240 lb 11.9 oz (109.2 kg)  04/30/21 224 lb 12.8 oz (102 kg)  12/19/20 192 lb (87.1 kg)    GEN: Well nourished, well developed in no acute distress NECK: No JVD; No carotid bruits CARDIAC: ***RRR, no murmurs, rubs, gallops RESPIRATORY:  Clear to auscultation without rales, wheezing or rhonchi  ABDOMEN: Soft, non-tender, non-distended EXTREMITIES:  No edema; No acute deformity     Asessement and Plan:.     ***     Disposition: F/u with ***  Signed, Samadhi Mahurin D Mykhia Danish, NP

## 2023-09-16 ENCOUNTER — Ambulatory Visit: Attending: Cardiology | Admitting: Cardiology

## 2023-09-16 DIAGNOSIS — E782 Mixed hyperlipidemia: Secondary | ICD-10-CM

## 2023-09-16 DIAGNOSIS — N186 End stage renal disease: Secondary | ICD-10-CM

## 2023-09-16 DIAGNOSIS — I10 Essential (primary) hypertension: Secondary | ICD-10-CM

## 2023-09-16 DIAGNOSIS — I214 Non-ST elevation (NSTEMI) myocardial infarction: Secondary | ICD-10-CM
